# Patient Record
Sex: Female | Born: 1990 | Race: Black or African American | Hispanic: No | Marital: Single | Smoking: Never smoker
Health system: Southern US, Community
[De-identification: ages and names within clinical notes are randomized; demographics above are authoritative.]

## PROBLEM LIST (undated history)

## (undated) ENCOUNTER — Inpatient Hospital Stay (HOSPITAL_COMMUNITY): Payer: Self-pay

## (undated) DIAGNOSIS — Z8619 Personal history of other infectious and parasitic diseases: Secondary | ICD-10-CM

## (undated) DIAGNOSIS — Z789 Other specified health status: Secondary | ICD-10-CM

## (undated) DIAGNOSIS — R011 Cardiac murmur, unspecified: Secondary | ICD-10-CM

## (undated) HISTORY — DX: Cardiac murmur, unspecified: R01.1

## (undated) HISTORY — DX: Personal history of other infectious and parasitic diseases: Z86.19

## (undated) HISTORY — PX: THERAPEUTIC ABORTION: SHX798

## (undated) HISTORY — PX: NO PAST SURGERIES: SHX2092

---
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
## 2009-10-18 ENCOUNTER — Ambulatory Visit: Payer: Self-pay | Admitting: Nurse Practitioner

## 2009-10-18 ENCOUNTER — Inpatient Hospital Stay (HOSPITAL_COMMUNITY): Admission: AD | Admit: 2009-10-18 | Discharge: 2009-10-18 | Payer: Self-pay | Admitting: Obstetrics & Gynecology

## 2010-01-14 NOTE — L&D Delivery Note (Addendum)
Addendum -     Small perineal skidmark, did not require sutures   Delivery Note At 11:08 AM a viable female was delivered via  (LOA). Loose nuchal cord x 1 slipped, baby handed up to pt abd, cord doubly clamped and cut by pt sister,  APGAR: 9, 10 weight 7 lb 0.3 oz (3184 g).   Placenta status: schultze 3 vessel Cord, spontaneous intact:   EBL 250 ml Anesthesia: Epidural  Episiotomy: none Lacerations: none Suture Repair: none Est. Blood Loss (mL):   Mom to postpartum.  Baby to with mother.  KREBSBACH, MARY 01/12/2011, 11:48 AM

## 2010-01-27 ENCOUNTER — Emergency Department (HOSPITAL_COMMUNITY)
Admission: EM | Admit: 2010-01-27 | Discharge: 2010-01-27 | Payer: Self-pay | Source: Home / Self Care | Admitting: Emergency Medicine

## 2010-03-29 LAB — URINE MICROSCOPIC-ADD ON

## 2010-03-29 LAB — URINALYSIS, ROUTINE W REFLEX MICROSCOPIC
Bilirubin Urine: NEGATIVE
Glucose, UA: NEGATIVE mg/dL
Ketones, ur: 15 mg/dL — AB
Nitrite: NEGATIVE
Protein, ur: 100 mg/dL — AB
Specific Gravity, Urine: >1.03 — ABNORMAL HIGH (ref 1.005–1.030)
Urobilinogen, UA: 1 mg/dL (ref 0.0–1.0)
pH: 5.5 (ref 5.0–8.0)

## 2010-03-29 LAB — WET PREP, GENITAL
Clue Cells Wet Prep HPF POC: NONE SEEN
Trich, Wet Prep: NONE SEEN
Yeast Wet Prep HPF POC: NONE SEEN

## 2010-03-29 LAB — POCT PREGNANCY, URINE: Preg Test, Ur: NEGATIVE

## 2010-03-29 LAB — GC/CHLAMYDIA PROBE AMP, GENITAL
Chlamydia, DNA Probe: NEGATIVE
GC Probe Amp, Genital: NEGATIVE

## 2010-05-21 ENCOUNTER — Other Ambulatory Visit: Payer: Self-pay | Admitting: Family Medicine

## 2010-05-21 DIAGNOSIS — Z3682 Encounter for antenatal screening for nuchal translucency: Secondary | ICD-10-CM

## 2010-05-21 DIAGNOSIS — Z3689 Encounter for other specified antenatal screening: Secondary | ICD-10-CM

## 2010-05-21 LAB — ABO/RH

## 2010-05-21 LAB — GC/CHLAMYDIA PROBE AMP, GENITAL: Chlamydia: NEGATIVE

## 2010-06-15 ENCOUNTER — Ambulatory Visit (HOSPITAL_COMMUNITY): Admission: RE | Admit: 2010-06-15 | Payer: Medicaid Other | Source: Ambulatory Visit

## 2010-06-15 ENCOUNTER — Other Ambulatory Visit (HOSPITAL_COMMUNITY): Payer: Self-pay | Admitting: Obstetrics and Gynecology

## 2010-06-15 ENCOUNTER — Ambulatory Visit (HOSPITAL_COMMUNITY)
Admission: RE | Admit: 2010-06-15 | Discharge: 2010-06-15 | Disposition: A | Discharge status: Home / Self Care | Payer: Medicaid Other | Source: Ambulatory Visit | Attending: Family Medicine | Admitting: Family Medicine

## 2010-06-15 ENCOUNTER — Other Ambulatory Visit: Payer: Self-pay | Admitting: Family Medicine

## 2010-06-15 DIAGNOSIS — Z3682 Encounter for antenatal screening for nuchal translucency: Secondary | ICD-10-CM

## 2010-06-15 DIAGNOSIS — Z3689 Encounter for other specified antenatal screening: Secondary | ICD-10-CM | POA: Insufficient documentation

## 2010-06-15 DIAGNOSIS — O351XX0 Maternal care for (suspected) chromosomal abnormality in fetus, not applicable or unspecified: Secondary | ICD-10-CM | POA: Insufficient documentation

## 2010-06-15 DIAGNOSIS — O3510X0 Maternal care for (suspected) chromosomal abnormality in fetus, unspecified, not applicable or unspecified: Secondary | ICD-10-CM | POA: Insufficient documentation

## 2010-06-29 ENCOUNTER — Other Ambulatory Visit (HOSPITAL_COMMUNITY): Payer: Self-pay | Admitting: Obstetrics and Gynecology

## 2010-06-29 ENCOUNTER — Encounter (HOSPITAL_COMMUNITY): Payer: Self-pay

## 2010-06-29 ENCOUNTER — Ambulatory Visit (HOSPITAL_COMMUNITY)
Admission: RE | Admit: 2010-06-29 | Discharge: 2010-06-29 | Disposition: A | Discharge status: Home / Self Care | Payer: Medicaid Other | Source: Ambulatory Visit | Attending: Family Medicine | Admitting: Family Medicine

## 2010-06-29 ENCOUNTER — Other Ambulatory Visit: Payer: Self-pay | Admitting: Family Medicine

## 2010-06-29 ENCOUNTER — Ambulatory Visit (HOSPITAL_COMMUNITY): Admission: RE | Admit: 2010-06-29 | Payer: Medicaid Other | Source: Ambulatory Visit

## 2010-06-29 DIAGNOSIS — Z3682 Encounter for antenatal screening for nuchal translucency: Secondary | ICD-10-CM

## 2010-06-29 DIAGNOSIS — O3510X0 Maternal care for (suspected) chromosomal abnormality in fetus, unspecified, not applicable or unspecified: Secondary | ICD-10-CM | POA: Insufficient documentation

## 2010-06-29 DIAGNOSIS — O351XX0 Maternal care for (suspected) chromosomal abnormality in fetus, not applicable or unspecified: Secondary | ICD-10-CM | POA: Insufficient documentation

## 2010-06-29 DIAGNOSIS — Z3689 Encounter for other specified antenatal screening: Secondary | ICD-10-CM | POA: Insufficient documentation

## 2010-07-04 ENCOUNTER — Ambulatory Visit (HOSPITAL_COMMUNITY)
Admission: RE | Admit: 2010-07-04 | Discharge: 2010-07-04 | Disposition: A | Discharge status: Home / Self Care | Payer: Medicaid Other | Source: Ambulatory Visit | Attending: Family Medicine | Admitting: Family Medicine

## 2010-07-04 DIAGNOSIS — Z3682 Encounter for antenatal screening for nuchal translucency: Secondary | ICD-10-CM

## 2010-07-04 DIAGNOSIS — O351XX0 Maternal care for (suspected) chromosomal abnormality in fetus, not applicable or unspecified: Secondary | ICD-10-CM | POA: Insufficient documentation

## 2010-07-04 DIAGNOSIS — O3510X0 Maternal care for (suspected) chromosomal abnormality in fetus, unspecified, not applicable or unspecified: Secondary | ICD-10-CM | POA: Insufficient documentation

## 2010-07-04 DIAGNOSIS — Z3689 Encounter for other specified antenatal screening: Secondary | ICD-10-CM | POA: Insufficient documentation

## 2010-07-23 ENCOUNTER — Ambulatory Visit (HOSPITAL_COMMUNITY): Admission: RE | Admit: 2010-07-23 | Payer: Medicaid Other | Source: Ambulatory Visit

## 2010-07-30 ENCOUNTER — Ambulatory Visit (HOSPITAL_COMMUNITY): Payer: Medicaid Other

## 2010-08-06 ENCOUNTER — Ambulatory Visit (HOSPITAL_COMMUNITY): Admission: RE | Admit: 2010-08-06 | Payer: Medicaid Other | Source: Ambulatory Visit

## 2010-08-19 ENCOUNTER — Encounter (HOSPITAL_COMMUNITY): Payer: Self-pay | Admitting: *Deleted

## 2010-08-19 ENCOUNTER — Inpatient Hospital Stay (HOSPITAL_COMMUNITY)
Admission: AD | Admit: 2010-08-19 | Discharge: 2010-08-19 | Disposition: A | Discharge status: Home / Self Care | Payer: Medicaid Other | Source: Ambulatory Visit | Attending: Obstetrics and Gynecology | Admitting: Obstetrics and Gynecology

## 2010-08-19 DIAGNOSIS — O99891 Other specified diseases and conditions complicating pregnancy: Secondary | ICD-10-CM | POA: Insufficient documentation

## 2010-08-19 DIAGNOSIS — T7840XA Allergy, unspecified, initial encounter: Secondary | ICD-10-CM | POA: Insufficient documentation

## 2010-08-19 HISTORY — DX: Other specified health status: Z78.9

## 2010-08-19 LAB — CBC
HCT: 32.7 % — ABNORMAL LOW (ref 36.0–46.0)
Hemoglobin: 10.9 g/dL — ABNORMAL LOW (ref 12.0–15.0)
MCHC: 33.3 g/dL (ref 30.0–36.0)
RBC: 3.8 MIL/uL — ABNORMAL LOW (ref 3.87–5.11)
WBC: 6.4 10*3/uL (ref 4.0–10.5)

## 2010-08-19 LAB — DIFFERENTIAL
Lymphocytes Relative: 23 % (ref 12–46)
Lymphs Abs: 1.5 10*3/uL (ref 0.7–4.0)
Monocytes Absolute: 0.6 10*3/uL (ref 0.1–1.0)
Monocytes Relative: 9 % (ref 3–12)
Neutro Abs: 4.2 10*3/uL (ref 1.7–7.7)
Neutrophils Relative %: 65 % (ref 43–77)

## 2010-08-19 MED ORDER — IBUPROFEN 800 MG PO TABS
800.0000 mg | ORAL_TABLET | Freq: Once | ORAL | Status: AC
  Administered 2010-08-19: 800 mg via ORAL
  Filled 2010-08-19: qty 1

## 2010-08-19 MED ORDER — LORATADINE 10 MG PO TABS
10.0000 mg | ORAL_TABLET | Freq: Once | ORAL | Status: AC
  Administered 2010-08-19: 10 mg via ORAL
  Filled 2010-08-19: qty 1

## 2010-08-19 MED ORDER — LORATADINE 10 MG PO TABS: 10.0000 mg | ORAL_TABLET | Freq: Every day | ORAL | Status: DC

## 2010-08-19 NOTE — ED Provider Notes (Signed)
History  Helen Curtis is a 19y.o. BF G1P0 at 20.1 weeks per [facility] 01/05/11 (by 10.6 wk u/s), presents with CC of throat feeling tight, swollen, eyes swollen, congested, since awoke this AM around 10am. Pt has never had this happen before.  The only food she has ingested, which she hasn't eaten recently is a hot dog at work yesterday at [employer].  She does work outside, but in shade. She does report recent change in lotion, but no other change in detergent, soaps, etc.  Denies rash or hives since onset of symptoms, and denies noticing any bug bites, etc.   Hx r/f 1.  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  H/o chlamydia Pt started out care at [facility], and since transferred to [facility]   Chief Complaint  Patient presents with  . Facial Swelling   HPI  OB History    Grav Para Term Preterm Abortions TAB SAB Ect Mult Living   1         0      Past Medical History  Diagnosis Date  . No pertinent past medical history     Past Surgical History  Procedure Date  . No past surgeries     No family history on file.  History  Substance Use Topics  . Smoking status: Never Smoker   . Smokeless tobacco: Not on file  . Alcohol Use: No    Allergies: No Known Allergies  Prescriptions prior to admission  Medication Sig Dispense Refill  . prenatal vitamin w/FE, FA (PRENATAL 1 + 1) 27-1 MG TABS Take 1 tablet by mouth daily.          Review of Systems  Constitutional: Negative.   HENT: Positive for congestion.        Reports nose has become congested since awoke, felt more "open" just after got up.   Throat not sore; feels tight.  Eyes: Positive for blurred vision and discharge.       Eyes more swollen when 1st awoke this am; some matter, but not more than usual.  Watery.  Respiratory:       No difficulty breathing, except congested  Cardiovascular: Negative.   Gastrointestinal: Negative.   Genitourinary: Negative.   Musculoskeletal: Negative.   Skin: Negative.     Neurological: Negative.    Physical Exam   Blood pressure 118/58, pulse 81, temperature 99 F (37.2 C), temperature source Oral, resp. rate 18, height 5\' 7"  (1.702 m), weight 81.194 kg (179 lb), last menstrual period 03/21/2010, SpO2 100.00%.  Physical Exam  Constitutional: She is oriented to person, place, and time. She appears well-developed and well-nourished. She appears distressed.       Mildly anxious on arrival r/e symptoms  HENT:  Head: Normocephalic and atraumatic.  Neck: Normal range of motion. Neck supple.  Cardiovascular: Normal rate and regular rhythm.   Respiratory: Effort normal and breath sounds normal.  GI: Soft. Bowel sounds are normal.       gravid  Musculoskeletal: Normal range of motion. She exhibits edema.       Trace generalized edema in BLE  Lymphadenopathy:    She has no cervical adenopathy.  Neurological: She is alert and oriented to person, place, and time. She has normal reflexes. She displays normal reflexes. No cranial nerve deficit. She exhibits normal muscle tone. Coordination normal.  Skin: Skin is warm and dry. No rash noted. She is not diaphoretic. No erythema.  Psychiatric: She has a normal mood and  affect. Her behavior is normal. Thought content normal.   EFM:  Positive FHT's  LABS:  wnl except Hgb=10.9 (12.5 on 05/21/10)  MAU Course  Procedures 1.  PE 2.  FHT's 3.  Claritin 10mg  po x1 4.  Ibuprofen 800mg  po x1 5.  CBC with diff  Assessment and Plan  1.  IUP at 20.1 2.  Allergic reaction(unknown cause) 3.  Decreasing Hgb from early pregnancy 4.  Improving symptoms after Claritin administration  1.  D/C Home--pt to pick up Claritin or Zyrtec OTC and continue daily 2.  911 if symptoms severe, cannot breathe, etc. 3.  Discussed ice/ cool compresses to eyes; do not scratch 4.  Sudafed prn/ Motrin prn 5.  F/U prn worsening s/s, concerns, & 08/28/10 as scheduled at [facility] 6.  Discussed iron-rich diet  Helen Curtis H 08/19/2010, 3:21 PM

## 2010-08-19 NOTE — Progress Notes (Signed)
Notified of  Pt c/o of facial and throat swelling. Orders recieved

## 2010-08-19 NOTE — Progress Notes (Signed)
H.Steeleman,CNM at bedside to eval pt.

## 2010-08-19 NOTE — ED Notes (Signed)
Pt feeling a little better. Feels swelling has gone down a littel. Still feels eyes are swollen. Notified H.Steeleman,CNM.

## 2010-08-19 NOTE — Progress Notes (Signed)
Pt reports she woke up and felt her throat felt swollen/tight. Face and eys feel puffy and swollen as well

## 2010-09-18 ENCOUNTER — Encounter (HOSPITAL_COMMUNITY): Payer: Self-pay | Admitting: *Deleted

## 2010-09-18 ENCOUNTER — Inpatient Hospital Stay (HOSPITAL_COMMUNITY)
Admission: AD | Admit: 2010-09-18 | Discharge: 2010-09-18 | Disposition: A | Discharge status: Home Health | Payer: Medicaid Other | Source: Ambulatory Visit | Attending: Obstetrics and Gynecology | Admitting: Obstetrics and Gynecology

## 2010-09-18 ENCOUNTER — Other Ambulatory Visit: Payer: Self-pay

## 2010-09-18 DIAGNOSIS — O9989 Other specified diseases and conditions complicating pregnancy, childbirth and the puerperium: Secondary | ICD-10-CM | POA: Insufficient documentation

## 2010-09-18 DIAGNOSIS — R079 Chest pain, unspecified: Secondary | ICD-10-CM | POA: Insufficient documentation

## 2010-09-18 MED ORDER — PANTOPRAZOLE SODIUM 40 MG PO TBEC: 40.0000 mg | DELAYED_RELEASE_TABLET | Freq: Every day | ORAL | Status: DC

## 2010-09-18 MED ORDER — GI COCKTAIL ~~LOC~~
30.0000 mL | Freq: Once | ORAL | Status: AC
  Administered 2010-09-18: 30 mL via ORAL
  Filled 2010-09-18: qty 30

## 2010-09-18 NOTE — ED Notes (Signed)
Almond Lint CNM at bedside

## 2010-09-18 NOTE — Progress Notes (Signed)
Pt states, " I started having a pain in my upper chest at 1830. It depends on the position that I am in, sometimes it is sharp and sometimes it feels tight. I had been upset before the chest pain started."

## 2010-09-18 NOTE — Progress Notes (Signed)
PT SAYS SHE WAS LEAVING SS  - WAS UPSET- CRYING. HAD 2 GLASSES OF WATER AT 630PM - CHEST HURTING- BURPED - THEN CHEST  HURT IN MIDDLE BREAST-  WHEN SHE DRANK  GINGER ALE- NOW STILL HAS SAME PAIN.   PT TEXTING ON PHONE.  NO PAIN/ PRESSURE / TIGHTENING  FROM LOWER ABD.

## 2010-09-19 NOTE — ED Provider Notes (Signed)
Helen Curtis is a 19 y.o. female presenting for chest pain.  Maternal Medical History:  Reason for admission: Reason for Admission:   nauseaFetal activity: Perceived fetal activity is normal.   Last perceived fetal movement was within the past hour.     HPI Pt presents to MAU for c/o chest pain that began about 1830, she states it is constant and now somewhat improved. Denies any associated sx's, ctx, VB or LOF, reports +FM.  Pregnancy not significant for any major complications.  OB History    Grav Para Term Preterm Abortions TAB SAB Ect Mult Living   1         0     Past Medical History  Diagnosis Date  . No pertinent past medical history    Past Surgical History  Procedure Date  . No past surgeries    Family History: family history is not on file. Social History:  reports that she has never smoked. She has never used smokeless tobacco. She reports that she does not drink alcohol or use illicit drugs.  Review of Systems  Respiratory: Negative for shortness of breath and wheezing.   Cardiovascular: Positive for chest pain. Negative for palpitations and orthopnea.  Gastrointestinal: Positive for heartburn. Negative for nausea, vomiting and abdominal pain.  Neurological: Negative for dizziness and tingling.  All other sx's, normal.     Blood pressure 110/56, pulse 73, temperature 98.5 F (36.9 C), temperature source Oral, resp. rate 20, height 5\' 5"  (1.651 m), weight 86.297 kg (190 lb 4 oz), last menstrual period 03/21/2010, SpO2 99.00%. Maternal Exam:  Abdomen: Patient reports no abdominal tenderness. Introitus: not evaluated.     Fetal Exam Fetal Monitor Review: Mode: ultrasound.   Baseline rate: 150.  Variability: moderate (6-25 bpm).   Pattern: accelerations present and no decelerations.    Fetal State Assessment: Category I - tracings are normal.     Physical Exam  Constitutional: She is oriented to person, place, and time. She appears well-developed and  well-nourished. No distress.  Cardiovascular: Normal rate, regular rhythm and normal heart sounds.   No murmur heard. Respiratory: Effort normal and breath sounds normal. No respiratory distress. She exhibits no tenderness.  GI: Soft. Normal appearance.  Neurological: She is alert and oriented to person, place, and time.  Skin: Skin is warm and dry.    Prenatal labs: ABO, Rh:   Antibody:   Rubella:   RPR:    HBsAg:    HIV:    GBS:     Assessment/Plan: 24wks IUP EKG WNL FHR reassuring Likely acid reflux  D/C home protonix 40mg  PO QD TUMS PRN F/U routine in office  D/W Dr. Evlyn Kanner M 09/19/2010, 12:21 AM

## 2010-12-04 LAB — STREP B DNA PROBE: GBS: POSITIVE

## 2011-01-05 ENCOUNTER — Inpatient Hospital Stay (HOSPITAL_COMMUNITY)
Admission: AD | Admit: 2011-01-05 | Discharge: 2011-01-06 | Disposition: A | Discharge status: Home / Self Care | Payer: Medicaid Other | Source: Ambulatory Visit | Attending: Obstetrics and Gynecology | Admitting: Obstetrics and Gynecology

## 2011-01-05 ENCOUNTER — Encounter (HOSPITAL_COMMUNITY): Payer: Self-pay | Admitting: Obstetrics and Gynecology

## 2011-01-05 DIAGNOSIS — O479 False labor, unspecified: Secondary | ICD-10-CM | POA: Insufficient documentation

## 2011-01-05 MED ORDER — ZOLPIDEM TARTRATE 10 MG PO TABS
10.0000 mg | ORAL_TABLET | Freq: Every evening | ORAL | Status: DC | PRN
  Administered 2011-01-06: 10 mg via ORAL
  Filled 2011-01-05: qty 1

## 2011-01-05 MED ORDER — ZOLPIDEM TARTRATE 10 MG PO TABS: 10.0000 mg | ORAL_TABLET | Freq: Every evening | ORAL | Status: DC | PRN

## 2011-01-05 NOTE — ED Provider Notes (Signed)
History   Helen Curtis is a 20y.o. Black female who presents at 40 weeks for labor check.  Has called twice today w/ low back and pelvic pain, with little relief from Tylenol or warm baths.  No LOF or VB.  Good fetal movement.  No fever or recent illness.  Followed by CNM service.  Hx r/f:  1.  H/o LVEIF--spontaneously resolved    Chief Complaint  Patient presents with  . Contractions  . Back Pain   HPI  OB History    Grav Para Term Preterm Abortions TAB SAB Ect Mult Living   1         0      Past Medical History  Diagnosis Date  . No pertinent past medical history     Past Surgical History  Procedure Date  . No past surgeries     History reviewed. No pertinent family history.  History  Substance Use Topics  . Smoking status: Never Smoker   . Smokeless tobacco: Never Used  . Alcohol Use: No    Allergies: No Known Allergies  Prescriptions prior to admission  Medication Sig Dispense Refill  . acetaminophen (TYLENOL) 325 MG tablet Take 650 mg by mouth every 6 (six) hours as needed.        . loratadine (CLARITIN) 10 MG tablet Take 1 tablet (10 mg total) by mouth daily.      . pantoprazole (PROTONIX) 40 MG tablet Take 1 tablet (40 mg total) by mouth daily.  30 tablet  1  . Prenatal Vit-Fe Fumarate-FA (PRENATAL MULTIVITAMIN) TABS Take 1 tablet by mouth daily.          ROS--see HPI Physical Exam   Blood pressure 120/62, pulse 79, temperature 99 F (37.2 C), temperature source Oral, resp. rate 20, height 5\' 7"  (1.702 m), weight 104.894 kg (231 lb 4 oz), last menstrual period 03/21/2010.  Physical Exam  Constitutional: She is oriented to person, place, and time. She appears well-developed and well-nourished. No distress.  Cardiovascular: Normal rate and regular rhythm.   Respiratory: Effort normal and breath sounds normal.  GI: Soft. Bowel sounds are normal.       gravid  Genitourinary:       Cx:  Dimple/50/-1 to O posterior  Musculoskeletal: She exhibits edema.         2+ BLE pitting edema  Neurological: She is alert and oriented to person, place, and time.  Skin: Skin is warm and dry.    MAU Course  Procedures 1.  NST 2.  Physical exam  Assessment and Plan  1.  IUP at 40.1 weeks 2.  False vs early labor 3.  Reactive NST 4.  Pelvic pain/pressure 5.  MOPS  1.  D/c home w/ labor precautions & FKC 2.  Ambien 10mg  po x1 in MAU, & Rx given 3.  Pt doesn't have f/u appt at office; will have office call when reopens after holiday to schedule  Tirso Laws H 01/05/2011, 11:58 PM

## 2011-01-05 NOTE — Progress Notes (Signed)
Pt states, " I've had low back and side pain since Monday. Today I've had a lot of pressure in the very bottom of my abdomen and I think I'm having braxton hicks contractions. What is really bothering me is the pain in the bottom."

## 2011-01-05 NOTE — Progress Notes (Signed)
"  UC's started about 2030.  The pain is in my lower back, but pressure is in my lower abd.  No leaking or bleeding.  I believe I've lost my mucous plug.  I've been having little wet spots."

## 2011-01-06 NOTE — Progress Notes (Signed)
Written and verbal d/c instructions given and understanding voiced. Pt not to drive after taking Ambien. Fetal kick count reviewed.

## 2011-01-11 ENCOUNTER — Telehealth (HOSPITAL_COMMUNITY): Payer: Self-pay | Admitting: *Deleted

## 2011-01-11 ENCOUNTER — Inpatient Hospital Stay (HOSPITAL_COMMUNITY)
Admission: AD | Admit: 2011-01-11 | Discharge: 2011-01-14 | DRG: 775 | Disposition: A | Discharge status: Home / Self Care | Payer: Medicaid Other | Source: Ambulatory Visit | Attending: Obstetrics and Gynecology | Admitting: Obstetrics and Gynecology

## 2011-01-11 ENCOUNTER — Encounter (HOSPITAL_COMMUNITY): Payer: Self-pay | Admitting: *Deleted

## 2011-01-11 ENCOUNTER — Inpatient Hospital Stay (HOSPITAL_COMMUNITY)
Admission: AD | Admit: 2011-01-11 | Discharge: 2011-01-11 | Disposition: A | Discharge status: Home / Self Care | Payer: Medicaid Other | Source: Ambulatory Visit | Attending: Obstetrics and Gynecology | Admitting: Obstetrics and Gynecology

## 2011-01-11 DIAGNOSIS — O99892 Other specified diseases and conditions complicating childbirth: Principal | ICD-10-CM | POA: Diagnosis present

## 2011-01-11 DIAGNOSIS — Z22330 Carrier of Group B streptococcus: Secondary | ICD-10-CM

## 2011-01-11 DIAGNOSIS — Z3490 Encounter for supervision of normal pregnancy, unspecified, unspecified trimester: Secondary | ICD-10-CM

## 2011-01-11 DIAGNOSIS — E669 Obesity, unspecified: Secondary | ICD-10-CM

## 2011-01-11 DIAGNOSIS — O471 False labor at or after 37 completed weeks of gestation: Secondary | ICD-10-CM

## 2011-01-11 DIAGNOSIS — Z87440 Personal history of urinary (tract) infections: Secondary | ICD-10-CM

## 2011-01-11 DIAGNOSIS — O479 False labor, unspecified: Secondary | ICD-10-CM | POA: Insufficient documentation

## 2011-01-11 MED ORDER — PROMETHAZINE HCL 25 MG/ML IJ SOLN
12.5000 mg | Freq: Four times a day (QID) | INTRAMUSCULAR | Status: DC | PRN
Start: 2011-01-11 — End: 2011-01-11
  Administered 2011-01-11: 12.5 mg via INTRAVENOUS
  Filled 2011-01-11: qty 1

## 2011-01-11 MED ORDER — LACTATED RINGERS IV BOLUS (SEPSIS)
500.0000 mL | Freq: Once | INTRAVENOUS | Status: AC
  Administered 2011-01-11: 500 mL via INTRAVENOUS

## 2011-01-11 MED ORDER — NALBUPHINE SYRINGE 5 MG/0.5 ML
10.0000 mg | INJECTION | INTRAMUSCULAR | Status: DC | PRN
  Administered 2011-01-11: 10 mg via INTRAVENOUS
  Filled 2011-01-11 (×2): qty 1

## 2011-01-11 NOTE — Progress Notes (Addendum)
History   20 yo g1p0 EDC 12/22 presents with c/o of contractions x 2 days hurting in back and front with uc, denies srom or vag bleeding, with +FM. Denies ha, visual spots or blurring, with swelling legs only, with +FM.  Problem list: GBS+ Hole in heart as a baby resolved LVEIF with normal AFP, fetal eccho WNL  Chief Complaint  Patient presents with  . Contractions   @SFHPI @  OB History    Grav Para Term Preterm Abortions TAB SAB Ect Mult Living   1         0      Past Medical History  Diagnosis Date  . No pertinent past medical history     Past Surgical History  Procedure Date  . No past surgeries     No family history on file.  History  Substance Use Topics  . Smoking status: Never Smoker   . Smokeless tobacco: Never Used  . Alcohol Use: No    Allergies: No Known Allergies   Physical Exam  Abd soft gravid, nt uc mild to mod, vag 1 100 -1 VTX I by Almond Lint, CNM, no vag bleeding, with +1 edema lower legs Blood pressure 128/73, pulse 86, temperature 97.5 F (36.4 C), temperature source Oral, resp. rate 16, height 5\' 7"  (1.702 m), weight 104.237 kg (229 lb 12.8 oz), last menstrual period 03/21/2010.    ED Course  40 6/7 week IUP GBS+ Contractions Plan: IV hydration, nubain, phenergan reassess cervix approximately 2 hours. Lavera Guise, CNM  Addendum: [phone] Vag 1 90 -1 VTX I No cervical change States more comfortable has slept Discharge home/ f/o induction 12/29 as scheduled. S/s labor to report reviewed. Lavera Guise, CNM

## 2011-01-11 NOTE — Progress Notes (Signed)
Pt G1 at 40.6wks, contracting every .  Denies leaking or bleeding.  Passed mucous plug today.

## 2011-01-11 NOTE — Telephone Encounter (Signed)
Preadmission screen  

## 2011-01-11 NOTE — Progress Notes (Signed)
Pt states, " I've had contractions every 4 min since last night. I was 1 cm this morning and now they are stronger."

## 2011-01-12 ENCOUNTER — Encounter (HOSPITAL_COMMUNITY): Payer: Self-pay | Admitting: Anesthesiology

## 2011-01-12 ENCOUNTER — Inpatient Hospital Stay (HOSPITAL_COMMUNITY): Payer: Medicaid Other | Admitting: Anesthesiology

## 2011-01-12 ENCOUNTER — Inpatient Hospital Stay (HOSPITAL_COMMUNITY): Admission: RE | Admit: 2011-01-12 | Payer: Self-pay | Source: Ambulatory Visit

## 2011-01-12 ENCOUNTER — Encounter (HOSPITAL_COMMUNITY): Payer: Self-pay

## 2011-01-12 ENCOUNTER — Encounter (HOSPITAL_COMMUNITY): Payer: Self-pay | Admitting: *Deleted

## 2011-01-12 DIAGNOSIS — Z22330 Carrier of Group B streptococcus: Secondary | ICD-10-CM

## 2011-01-12 DIAGNOSIS — E669 Obesity, unspecified: Secondary | ICD-10-CM

## 2011-01-12 LAB — COMPREHENSIVE METABOLIC PANEL
Albumin: 2.7 g/dL — ABNORMAL LOW (ref 3.5–5.2)
BUN: 3 mg/dL — ABNORMAL LOW (ref 6–23)
Creatinine, Ser: 0.53 mg/dL (ref 0.50–1.10)
GFR calc Af Amer: >90 mL/min (ref >90)
Glucose, Bld: 87 mg/dL (ref 70–99)
Total Protein: 6.2 g/dL (ref 6.0–8.3)

## 2011-01-12 LAB — URIC ACID: Uric Acid, Serum: 4 mg/dL (ref 2.4–7.0)

## 2011-01-12 LAB — URINE MICROSCOPIC-ADD ON

## 2011-01-12 LAB — URINALYSIS, ROUTINE W REFLEX MICROSCOPIC
Bilirubin Urine: NEGATIVE
Ketones, ur: 40 mg/dL — AB
Nitrite: NEGATIVE
Protein, ur: 30 mg/dL — AB
Urobilinogen, UA: 0.2 mg/dL (ref 0.0–1.0)

## 2011-01-12 LAB — CBC
HCT: 35.8 % — ABNORMAL LOW (ref 36.0–46.0)
Hemoglobin: 12 g/dL (ref 12.0–15.0)
MCV: 81.7 fL (ref 78.0–100.0)
RBC: 4.38 MIL/uL (ref 3.87–5.11)
RDW: 15.8 % — ABNORMAL HIGH (ref 11.5–15.5)
WBC: 9 10*3/uL (ref 4.0–10.5)

## 2011-01-12 LAB — LACTATE DEHYDROGENASE: LDH: 189 U/L (ref 94–250)

## 2011-01-12 MED ORDER — ONDANSETRON HCL 4 MG PO TABS: 4.0000 mg | ORAL_TABLET | ORAL | Status: DC | PRN

## 2011-01-12 MED ORDER — IBUPROFEN 600 MG PO TABS: 600.0000 mg | ORAL_TABLET | Freq: Four times a day (QID) | ORAL | Status: DC | PRN

## 2011-01-12 MED ORDER — OXYCODONE-ACETAMINOPHEN 5-325 MG PO TABS: 2.0000 | ORAL_TABLET | ORAL | Status: DC | PRN

## 2011-01-12 MED ORDER — ZOLPIDEM TARTRATE 5 MG PO TABS: 5.0000 mg | ORAL_TABLET | Freq: Every evening | ORAL | Status: DC | PRN

## 2011-01-12 MED ORDER — TERBUTALINE SULFATE 1 MG/ML IJ SOLN: 0.2500 mg | Freq: Once | INTRAMUSCULAR | Status: DC | PRN

## 2011-01-12 MED ORDER — PHENYLEPHRINE 40 MCG/ML (10ML) SYRINGE FOR IV PUSH (FOR BLOOD PRESSURE SUPPORT): 80.0000 ug | PREFILLED_SYRINGE | INTRAVENOUS | Status: DC | PRN

## 2011-01-12 MED ORDER — BENZOCAINE-MENTHOL 20-0.5 % EX AERO: 1.0000 "application " | INHALATION_SPRAY | CUTANEOUS | Status: DC | PRN

## 2011-01-12 MED ORDER — WITCH HAZEL-GLYCERIN EX PADS: 1.0000 "application " | MEDICATED_PAD | CUTANEOUS | Status: DC | PRN

## 2011-01-12 MED ORDER — OXYTOCIN 10 UNIT/ML IJ SOLN: 10.0000 [IU] | Freq: Once | INTRAMUSCULAR | Status: DC

## 2011-01-12 MED ORDER — PROMETHAZINE HCL 25 MG/ML IJ SOLN
12.5000 mg | Freq: Once | INTRAMUSCULAR | Status: AC
  Administered 2011-01-12: 12.5 mg via INTRAVENOUS
  Filled 2011-01-12: qty 1

## 2011-01-12 MED ORDER — DIBUCAINE 1 % RE OINT: 1.0000 "application " | TOPICAL_OINTMENT | RECTAL | Status: DC | PRN

## 2011-01-12 MED ORDER — BUTORPHANOL TARTRATE 2 MG/ML IJ SOLN: 2.0000 mg | INTRAMUSCULAR | Status: DC | PRN

## 2011-01-12 MED ORDER — SIMETHICONE 80 MG PO CHEW: 80.0000 mg | CHEWABLE_TABLET | ORAL | Status: DC | PRN

## 2011-01-12 MED ORDER — PRENATAL MULTIVITAMIN CH
1.0000 | ORAL_TABLET | Freq: Every day | ORAL | Status: DC
  Administered 2011-01-12 – 2011-01-14 (×3): 1 via ORAL
  Filled 2011-01-12 (×3): qty 1

## 2011-01-12 MED ORDER — ACETAMINOPHEN 325 MG PO TABS: 650.0000 mg | ORAL_TABLET | ORAL | Status: DC | PRN

## 2011-01-12 MED ORDER — EPHEDRINE 5 MG/ML INJ
10.0000 mg | INTRAVENOUS | Status: DC | PRN
  Filled 2011-01-12: qty 4

## 2011-01-12 MED ORDER — LIDOCAINE HCL 1.5 % IJ SOLN
INTRAMUSCULAR | Status: DC | PRN
  Administered 2011-01-12 (×2): 4 mL via EPIDURAL

## 2011-01-12 MED ORDER — LACTATED RINGERS IV SOLN
500.0000 mL | Freq: Once | INTRAVENOUS | Status: AC
  Administered 2011-01-12: 500 mL via INTRAVENOUS

## 2011-01-12 MED ORDER — PENICILLIN G POTASSIUM 5000000 UNITS IJ SOLR
2.5000 10*6.[IU] | INTRAVENOUS | Status: DC
  Administered 2011-01-12 (×2): 2.5 10*6.[IU] via INTRAVENOUS
  Filled 2011-01-12 (×7): qty 2.5

## 2011-01-12 MED ORDER — LANOLIN HYDROUS EX OINT: TOPICAL_OINTMENT | CUTANEOUS | Status: DC | PRN

## 2011-01-12 MED ORDER — FENTANYL 2.5 MCG/ML BUPIVACAINE 1/10 % EPIDURAL INFUSION (WH - ANES)
14.0000 mL/h | INTRAMUSCULAR | Status: DC
  Administered 2011-01-12 (×3): 14 mL/h via EPIDURAL
  Filled 2011-01-12 (×4): qty 60

## 2011-01-12 MED ORDER — PENICILLIN G POTASSIUM 5000000 UNITS IJ SOLR
5.0000 10*6.[IU] | Freq: Once | INTRAMUSCULAR | Status: AC
  Administered 2011-01-12: 5 10*6.[IU] via INTRAVENOUS
  Filled 2011-01-12: qty 5

## 2011-01-12 MED ORDER — DIPHENHYDRAMINE HCL 50 MG/ML IJ SOLN: 12.5000 mg | INTRAMUSCULAR | Status: DC | PRN

## 2011-01-12 MED ORDER — OXYTOCIN BOLUS FROM INFUSION
500.0000 mL | Freq: Once | INTRAVENOUS | Status: DC
  Filled 2011-01-12: qty 1000
  Filled 2011-01-12: qty 500

## 2011-01-12 MED ORDER — LACTATED RINGERS IV SOLN
INTRAVENOUS | Status: DC
  Administered 2011-01-12: 05:00:00 via INTRAVENOUS

## 2011-01-12 MED ORDER — SODIUM CHLORIDE 0.9 % IV SOLN: 250.0000 mL | INTRAVENOUS | Status: DC | PRN

## 2011-01-12 MED ORDER — EPHEDRINE 5 MG/ML INJ: 10.0000 mg | INTRAVENOUS | Status: DC | PRN

## 2011-01-12 MED ORDER — DIPHENHYDRAMINE HCL 25 MG PO CAPS: 25.0000 mg | ORAL_CAPSULE | Freq: Four times a day (QID) | ORAL | Status: DC | PRN

## 2011-01-12 MED ORDER — IBUPROFEN 600 MG PO TABS
600.0000 mg | ORAL_TABLET | Freq: Four times a day (QID) | ORAL | Status: DC
  Administered 2011-01-12 – 2011-01-14 (×8): 600 mg via ORAL
  Filled 2011-01-12 (×8): qty 1

## 2011-01-12 MED ORDER — FENTANYL 2.5 MCG/ML BUPIVACAINE 1/10 % EPIDURAL INFUSION (WH - ANES)
INTRAMUSCULAR | Status: DC | PRN
  Administered 2011-01-12: 14 mL/h via EPIDURAL

## 2011-01-12 MED ORDER — OXYCODONE-ACETAMINOPHEN 5-325 MG PO TABS: 1.0000 | ORAL_TABLET | ORAL | Status: DC | PRN

## 2011-01-12 MED ORDER — LIDOCAINE HCL (PF) 1 % IJ SOLN
30.0000 mL | INTRAMUSCULAR | Status: DC | PRN
  Filled 2011-01-12: qty 30

## 2011-01-12 MED ORDER — SODIUM CHLORIDE 0.9 % IJ SOLN: 3.0000 mL | Freq: Two times a day (BID) | INTRAMUSCULAR | Status: DC

## 2011-01-12 MED ORDER — LIDOCAINE-EPINEPHRINE (PF) 2 %-1:200000 IJ SOLN
INTRAMUSCULAR | Status: AC
  Filled 2011-01-12: qty 20

## 2011-01-12 MED ORDER — SENNOSIDES-DOCUSATE SODIUM 8.6-50 MG PO TABS
2.0000 | ORAL_TABLET | Freq: Every day | ORAL | Status: DC
  Administered 2011-01-12 – 2011-01-13 (×2): 2 via ORAL

## 2011-01-12 MED ORDER — TETANUS-DIPHTH-ACELL PERTUSSIS 5-2.5-18.5 LF-MCG/0.5 IM SUSP: 0.5000 mL | Freq: Once | INTRAMUSCULAR | Status: DC

## 2011-01-12 MED ORDER — ONDANSETRON HCL 4 MG/2ML IJ SOLN: 4.0000 mg | INTRAMUSCULAR | Status: DC | PRN

## 2011-01-12 MED ORDER — SODIUM CHLORIDE 0.9 % IJ SOLN: 3.0000 mL | INTRAMUSCULAR | Status: DC | PRN

## 2011-01-12 MED ORDER — MEDROXYPROGESTERONE ACETATE 150 MG/ML IM SUSP: 150.0000 mg | INTRAMUSCULAR | Status: DC | PRN

## 2011-01-12 MED ORDER — SODIUM BICARBONATE 8.4 % IV SOLN
INTRAVENOUS | Status: DC | PRN
  Administered 2011-01-12: 5 mL via EPIDURAL

## 2011-01-12 MED ORDER — CITRIC ACID-SODIUM CITRATE 334-500 MG/5ML PO SOLN: 30.0000 mL | ORAL | Status: DC | PRN

## 2011-01-12 MED ORDER — LACTATED RINGERS IV SOLN: 500.0000 mL | INTRAVENOUS | Status: DC | PRN

## 2011-01-12 MED ORDER — ONDANSETRON HCL 4 MG/2ML IJ SOLN
4.0000 mg | Freq: Four times a day (QID) | INTRAMUSCULAR | Status: DC | PRN
  Administered 2011-01-12: 4 mg via INTRAVENOUS
  Filled 2011-01-12: qty 2

## 2011-01-12 MED ORDER — PHENYLEPHRINE 40 MCG/ML (10ML) SYRINGE FOR IV PUSH (FOR BLOOD PRESSURE SUPPORT)
80.0000 ug | PREFILLED_SYRINGE | INTRAVENOUS | Status: DC | PRN
  Filled 2011-01-12: qty 5

## 2011-01-12 MED ORDER — SODIUM BICARBONATE 8.4 % IV SOLN
INTRAVENOUS | Status: AC
  Filled 2011-01-12: qty 50

## 2011-01-12 MED ORDER — OXYTOCIN 20 UNITS IN LACTATED RINGERS INFUSION - SIMPLE: 1.0000 m[IU]/min | INTRAVENOUS | Status: DC

## 2011-01-12 MED ORDER — OXYTOCIN 20 UNITS IN LACTATED RINGERS INFUSION - SIMPLE
125.0000 mL/h | Freq: Once | INTRAVENOUS | Status: AC
  Administered 2011-01-12: 1000 mL/h via INTRAVENOUS

## 2011-01-12 NOTE — Anesthesia Postprocedure Evaluation (Signed)
  Anesthesia Post-op Note  Patient: Helen Curtis  Procedure(s) Performed: * No procedures listed *  Patient Location: PACU and Mother/Baby  Anesthesia Type: Epidural  Level of Consciousness: awake, alert  and oriented  Airway and Oxygen Therapy: Patient Spontanous Breathing   Post-op Assessment: Patient's Cardiovascular Status Stable and Respiratory Function Stable  Post-op Vital Signs: stable  Complications: No apparent anesthesia complications

## 2011-01-12 NOTE — Progress Notes (Signed)
Patient ID: Helen Curtis, female   DOB: [birth year], 20 y.o.   MRN: [id number] .Subjective:  C/o pain scale 10, despite multiple positions changes and epidural PCA, some ctx it's only on 1 side, others it's both. Denies pressure or vaginal pain, states pain is suprapubic.   Objective: BP 146/67  Pulse 94  Temp(Src) 98.3 F (36.8 C) (Oral)  Resp 20  Ht 5\' 7"  (1.702 m)  Wt 103.874 kg (229 lb)  BMI 35.87 kg/m2  LMP 03/21/2010   FHT:  FHR: 125 bpm, variability: moderate,  accelerations:  Present,  decelerations:  Absent UC:   regular, every 2-4 minutes, coupling - toco wasn't tracing well, IUPC placed  SVE:   Dilation: 8 Effacement (%): 100 Station: -1 Exam by:: S. Kimesha Claxton, CNM    Assessment / Plan: Spontaneous labor, progressing normally GBS pos IUPC placed - vertex now OT but still asynclitic    Fetal Wellbeing:  Category I Pain Control:  Epidural Will call anesthesia to evaluate epidural   Update physician PRN  Malissa Hippo 01/12/2011, 5:56 AM

## 2011-01-12 NOTE — Anesthesia Preprocedure Evaluation (Signed)
Anesthesia Evaluation  Patient identified by MRN, date of birth, ID band Patient awake    Reviewed: Allergy & Precautions, H&P , Patient's Chart, lab work & pertinent test results  Airway Mallampati: III TM Distance: >3 FB Neck ROM: full    Dental No notable dental hx. (+) Teeth Intact   Pulmonary neg pulmonary ROS,  clear to auscultation  Pulmonary exam normal       Cardiovascular neg cardio ROS + Valvular Problems/Murmurs regular Normal Hx/o "Hole in heart" which she outgrew   Neuro/Psych Negative Neurological ROS  Negative Psych ROS   GI/Hepatic negative GI ROS, Neg liver ROS,   Endo/Other  Morbid obesity  Renal/GU negative Renal ROS  Genitourinary negative   Musculoskeletal   Abdominal Normal abdominal exam  (+)   Peds  Hematology negative hematology ROS (+)   Anesthesia Other Findings   Reproductive/Obstetrics (+) Pregnancy                           Anesthesia Physical Anesthesia Plan  ASA: III  Anesthesia Plan: Epidural   Post-op Pain Management:    Induction:   Airway Management Planned:   Additional Equipment:   Intra-op Plan:   Post-operative Plan:   Informed Consent: I have reviewed the patients History and Physical, chart, labs and discussed the procedure including the risks, benefits and alternatives for the proposed anesthesia with the patient or authorized representative who has indicated his/her understanding and acceptance.     Plan Discussed with: Anesthesiologist and Surgeon  Anesthesia Plan Comments:         Anesthesia Quick Evaluation

## 2011-01-12 NOTE — Progress Notes (Signed)
Baby to R breast using football hold.  Opens wide, but cries at breast, does not want to suck.  Placed back on chest for cont'd skin-to-skin & will try again shortly.

## 2011-01-12 NOTE — H&P (Signed)
Helen Curtis is a 20 y.o. female presenting for onset of ctx, was sent home early this AM after IVF"s and pain meds. States she slept until about 4pm and then ctx returned, stronger now. Denies LOF or VB, +FM.  Pregnancy significant for:  1. +GBS 2. Pt has hx of hole in heart, resolved 3. FETAL IEF, fetal echo normal 4. Obesity  HPI: [facility] 8[facility], a 10wk [place] changed EDC to 12/22 from LMP date of 12/12. SHe then tx care to [facility] at 16wks, and has followed a normal prenatal course. LVEIF was noted on [place], but then resolved. A fetal echo was done and was normal.   Maternal Medical History:  Reason for admission: Reason for admission: contractions.  Contractions: Onset was 2 days ago.   Frequency: regular.   Perceived severity is moderate.    Fetal activity: Perceived fetal activity is normal.   Last perceived fetal movement was within the past hour.    Prenatal complications: no prenatal complications   OB History    Grav Para Term Preterm Abortions TAB SAB Ect Mult Living   1 0 0 0 0 0 0 0 0 0      Past Medical History  Diagnosis Date  . No pertinent past medical history   . History of chicken pox   . History of chlamydia   . Heart murmur     born with small hole in heart "I outgrew it"   Past Surgical History  Procedure Date  . No past surgeries    Family History: family history includes Alzheimer's disease in her paternal grandmother; Cancer in her paternal grandfather and paternal grandmother; Hypertension in her maternal grandmother and mother; and Seizures in her mother.  There is no history of Anesthesia problems. Social History:  reports that she has never smoked. She has never used smokeless tobacco. She reports that she does not drink alcohol or use illicit drugs.  Pt is a single AAF, 20yo, 24yrs education and works FT in Engineering geologist. Her mother is here and supportive and FOB is involved and supportive.   Review of Systems  All other  systems reviewed and are negative.      Blood pressure 125/78, pulse 94, temperature 99.1 F (37.3 C), temperature source Oral, resp. rate 20, height 5\' 7"  (1.702 m), weight 103.874 kg (229 lb), last menstrual period 03/21/2010. Maternal Exam:  Uterine Assessment: Contraction strength is moderate.  Contraction duration is 60 seconds. Contraction frequency is regular.   Abdomen: Patient reports no abdominal tenderness. Fundal height is AGA.   Estimated fetal weight is 8#.   Fetal presentation: vertex  Introitus: Normal vulva. Normal vagina.  Amniotic fluid character: not assessed.  Pelvis: adequate for delivery.   Cervix: Cervix evaluated by digital exam.     Fetal Exam Fetal Monitor Review: Mode: ultrasound.   Baseline rate: 145.  Variability: moderate (6-25 bpm).   Pattern: accelerations present and no decelerations.    Fetal State Assessment: Category I - tracings are normal.     Physical Exam  Nursing note and vitals reviewed. Constitutional: She is oriented to person, place, and time. She appears well-developed and well-nourished.  Neck: Normal range of motion.  Cardiovascular: Normal rate and normal heart sounds.   Respiratory: Effort normal.  GI: Soft.  Genitourinary: Vagina normal.  Musculoskeletal: Normal range of motion. She exhibits edema.       3+ BLE - no change from previous exam  Neurological: She is alert  and oriented to person, place, and time.  Skin: Skin is warm and dry.  Psychiatric: She has a normal mood and affect. Her behavior is normal. Judgment and thought content normal.   VE=4cm/100/-1 vtx    Prenatal labs:   ABO, Rh: B/Negative/-- (05/07 0000) Antibody: Negative (05/07 0000) Rubella: Immune (05/07 0000) RPR:   NR HBsAg: Negative (05/07 0000)  HIV: Non-reactive (05/07 0000)  GBS: Positive (11/20 0000)  Hgb electro - neg Varicella immune GC/CT - neg 1hr gtt = 90   Assessment/Plan: IUP at 41wks Early labor GBS pos FHR  reassuring  Admit to birthing suites, dr Su Hilt attending, cnm care Desires epidural Arom/pitiocin PRN   Helen Curtis M 01/12/2011, 12:03 AM

## 2011-01-12 NOTE — Anesthesia Procedure Notes (Addendum)
Epidural Patient location during procedure: OB Start time: 01/12/2011 1:15 AM  Staffing Anesthesiologist: FOSTER, MICHAEL A. Performed by: anesthesiologist   Preanesthetic Checklist Completed: patient identified, site marked, surgical consent, pre-op evaluation, timeout performed, IV checked, risks and benefits discussed and monitors and equipment checked  Epidural Patient position: sitting Prep: site prepped and draped and DuraPrep Patient monitoring: continuous pulse ox and blood pressure Approach: midline Injection technique: LOR air  Needle:  Needle type: Tuohy  Needle gauge: 17 G Needle length: 9 cm Needle insertion depth: 5 cm cm Catheter type: closed end flexible Catheter size: 19 Gauge Catheter at skin depth: 10 cm Test dose: negative and 1.5% lidocaine  Assessment Events: blood not aspirated, injection not painful, no injection resistance, negative IV test and no paresthesia  Additional Notes Patient is more comfortable after epidural dosed. Please see RN's note for documentation of vital signs and FHR which are stable.

## 2011-01-12 NOTE — Progress Notes (Addendum)
C/o of pressure with contractions, asleep between O VSS     Fhts 140 LTV mod accels     uc q 1-3 tripling at times     Vag rim 0/+1 by RN A active labor P labor down. Lavera Guise, CNM  Addendum: Serial bps elevated will do PIH labs now 0920 Lavera Guise, CNM

## 2011-01-12 NOTE — Progress Notes (Signed)
Patient ID: Helen Curtis, female   DOB: 1990-06-20, 21 y.o.   MRN: [id number] .Subjective:  Comfortable now after epidural, mom and FOB at bs  Objective: BP 146/71  Pulse 85  Temp(Src) 98.2 F (36.8 C) (Oral)  Resp 18  Ht 5\' 7"  (1.702 m)  Wt 103.874 kg (229 lb)  BMI 35.87 kg/m2  LMP 03/21/2010   FHT:  FHR: 130 bpm, variability: moderate,  accelerations:  Present,  decelerations:  Present occ mild variable UC:   irregular, every 2-5 minutes SVE:   Dilation: 4 Effacement (%): 100 Station: -1 Exam by:: Dupont, RN     Assessment / Plan: Spontaneous labor, progressing normally GBS pos, PCN started Likely OP position, will utilize positions changes   Fetal Wellbeing:  Category I Pain Control:  Epidural  Will recheck cx in 2 hours and plan AROM/Pitocin/IUPC PRN  Update physician PRN  Nana Hoselton M 01/12/2011, 2:09 AM

## 2011-01-12 NOTE — Progress Notes (Signed)
Patient ID: Helen Curtis, female   DOB: 04/05/1990, 20 y.o.   MRN: [id number] .Subjective: C/o shaking and L lower side "pressure" cramping, difficult to describe, has been moved s-s in exasserated   sims, but states this makes shaking worse. Pt appears anxious and tense. States she feels like throwing up but can't.  Denies pain with ctx   Objective: BP 150/83  Pulse 86  Temp(Src) 98.2 F (36.8 C) (Oral)  Resp 18  Ht 5\' 7"  (1.702 m)  Wt 103.874 kg (229 lb)  BMI 35.87 kg/m2  LMP 03/21/2010   FHT:  FHR: 125 bpm, variability: moderate,  accelerations:  Present,  decelerations:  Present occ mild variables UC:   regular, every 4-5 minutes SVE:   Dilation: 6 Effacement (%): 100 Station: -1 Exam by:: Kerrigan Gombos, CNM    Assessment / Plan: Spontaneous labor, progressing normally GBS pos, PCN given Appears to have SROM with clear/blood tinged fluid   Fetal Wellbeing:  Category I Pain Control:  Epidural Will given phenergan IV for relaxation/nausea  Update physician PRN  Kinser Fellman M 01/12/2011, 3:25 AM

## 2011-01-13 LAB — CBC
HCT: 31.8 % — ABNORMAL LOW (ref 36.0–46.0)
MCV: 82.4 fL (ref 78.0–100.0)
Platelets: 156 10*3/uL (ref 150–400)
RBC: 3.86 MIL/uL — ABNORMAL LOW (ref 3.87–5.11)
RDW: 15.8 % — ABNORMAL HIGH (ref 11.5–15.5)
WBC: 11.1 10*3/uL — ABNORMAL HIGH (ref 4.0–10.5)

## 2011-01-13 NOTE — Progress Notes (Addendum)
Post Partum Day 1 Subjective: no complaints, up ad lib, voiding, tolerating PO, + flatus and newborn at breast while at bedside for round.  s.o. at bedside.  No dizziness.  VB tapering.  Objective: Blood pressure 114/72, pulse 80, temperature 98 F (36.7 C), temperature source Oral, resp. rate 18, height 5\' 7"  (1.702 m), weight 103.874 kg (229 lb), last menstrual period 03/21/2010, unknown if currently breastfeeding.  Physical Exam:  General: alert, cooperative, fatigued, no distress and moderately obese Lochia: appropriate Uterine Fundus: firm, below umbilicus Incision: n/a DVT Evaluation: No evidence of DVT seen on physical exam. Negative Homan's sign. 2+ BLE pitting edema   Basename 01/13/11 0547 01/12/11 0030  HGB 10.3* 12.0  HCT 31.8* 35.8*    Assessment/Plan: Plan for discharge tomorrow, Breastfeeding and Lactation consult; Continue current POC.  LOS: 2 days   STEELMAN,CANDICE H 01/13/2011, 11:01 AM    Agree with above - AYR

## 2011-01-13 NOTE — Progress Notes (Signed)
Sitting in bed eatting supper, rm. Very hot. Stated had a sudden dizzy spell. Encouraged fluids, turned heat in rm. Down. Fundus firm, bleeding WDL. Stated feeling better, just a quick sudden feeling, pt. Stated maybe she was to hot. Cool cloth given to wipe face. Pt. Mom at bedside. Stated no heavy bleeding or clots during day. Will occasionally have a slight dizzy feeling when she first gets up. Encouraged to get up slow. Reporting to oncoming nurse.

## 2011-01-14 MED ORDER — NORETHINDRONE 0.35 MG PO TABS: 1.0000 | ORAL_TABLET | Freq: Every day | ORAL | Status: DC

## 2011-01-14 MED ORDER — IBUPROFEN 600 MG PO TABS: 600.0000 mg | ORAL_TABLET | Freq: Four times a day (QID) | ORAL | Status: AC

## 2011-01-14 NOTE — Progress Notes (Signed)
UR chart review completed.  

## 2011-01-14 NOTE — Discharge Summary (Signed)
   Obstetric Discharge Summary Reason for Admission: onset of labor Prenatal Procedures: NST and ultrasound Intrapartum Procedures: spontaneous vaginal delivery and GBS prophylaxis, epidural Postpartum Procedures: none Complications-Operative and Postpartum: none  Temp:  [97.8 F (36.6 C)] 97.8 F (36.6 C) (12/31 0530) Pulse Rate:  [87] 87  (12/31 0530) Resp:  [18] 18  (12/31 0530) BP: (123)/(80) 123/80 mmHg (12/31 0530) Hemoglobin  Date Value Range Status  01/13/2011 10.3* 12.0-15.0 (g/dL) Final     HCT  Date Value Range Status  01/13/2011 31.8* 36.0-46.0 (%) Final    Hospital Course:  Hospital Course: Admitted in labor at 4cm. pos GBS. Received epidural and continued to progress. IUPC was placed secondary to difficulty tracing ctx. Progressed to fully dilated, . Delivery was performed by Maryln Manuel, CNM without difficulty. Patient and baby tolerated the procedure without difficulty, with no laceration noted. Infant to FTN. Mother and infant then had an uncomplicated postpartum course, with breast feeding going well. Mom's physical exam was WNL, and she was discharged home in stable condition. Contraception plan was Pharmacist, hospital.  She received adequate benefit from po pain medications.  Discharge Diagnoses: Term Pregnancy-delivered  Discharge Information: Date: 01/14/2011 Activity: pelvic rest Diet: routine Medications:  Medication List  As of 01/14/2011 10:28 PM   START taking these medications         ibuprofen 600 MG tablet   Commonly known as: ADVIL,MOTRIN   Take 1 tablet (600 mg total) by mouth every 6 (six) hours.      norethindrone 0.35 MG tablet   Commonly known as: MICRONOR,CAMILA,ERRIN   Take 1 tablet (0.35 mg total) by mouth daily.         CONTINUE taking these medications         prenatal multivitamin Tabs         STOP taking these medications         acetaminophen 325 MG tablet      diphenhydrAMINE 12.5 MG/5ML elixir          Where to get your  medications    These are the prescriptions that you need to pick up.   You may get these medications from any pharmacy.         ibuprofen 600 MG tablet   norethindrone 0.35 MG tablet           Condition: stable Instructions: refer to practice specific booklet Discharge to: home Follow-up Information    Follow up with Maebell Lyvers M, CNM in 6 weeks. (If symptoms worsen)    Contact information:   [address] [place] [address] [phone]          Newborn Data: Live born  Information for the patient's newborn:  Tikia, Skilton [[id number]]  female ; APGAR 9, 10 ; weight ; 7# Home with mother.  Riot Barrick M 01/14/2011, 10:28 PM

## 2011-01-14 NOTE — Progress Notes (Signed)
Patient ID: Helen Curtis, female   DOB: 1990/09/21, 20 y.o.   MRN: [id number] Post Partum Day 2 Subjective: no complaints, up ad lib without syncope, voiding, tolerating PO, + flatus  Pain well controlled with po meds BF well Mood stable, bonding well   Objective: Blood pressure 123/80, pulse 87, temperature 97.8 F (36.6 C), temperature source Oral, resp. rate 18, height 5\' 7"  (1.702 m), weight 103.874 kg (229 lb), last menstrual period 03/21/2010, unknown if currently breastfeeding.  Physical Exam:  General: alert and no distress Lungs: CTAB Heart: RRR Breasts: WNL Lochia: appropriate Uterine Fundus: firm Perineum: WNL DVT Evaluation: No evidence of DVT seen on physical exam. Negative Homan's sign. Calf/Ankle edema is present.   Basename 01/13/11 0547 01/12/11 0030  HGB 10.3* 12.0  HCT 31.8* 35.8*    Assessment/Plan: Discharge home, Breastfeeding and Contraception micronor      LOS: 3 days   Cisco Kindt M 01/14/2011, 8:30 AM

## 2011-03-12 ENCOUNTER — Encounter (INDEPENDENT_AMBULATORY_CARE_PROVIDER_SITE_OTHER): Payer: Medicaid Other | Admitting: Registered Nurse

## 2011-03-12 DIAGNOSIS — Z3049 Encounter for surveillance of other contraceptives: Secondary | ICD-10-CM

## 2011-03-12 DIAGNOSIS — Z30017 Encounter for initial prescription of implantable subdermal contraceptive: Secondary | ICD-10-CM

## 2011-04-02 ENCOUNTER — Encounter: Payer: Medicaid Other | Admitting: Registered Nurse

## 2011-07-27 ENCOUNTER — Emergency Department (HOSPITAL_COMMUNITY)
Admission: EM | Admit: 2011-07-27 | Discharge: 2011-07-28 | Disposition: A | Discharge status: Home / Self Care | Payer: Self-pay | Attending: Emergency Medicine | Admitting: Emergency Medicine

## 2011-07-27 ENCOUNTER — Encounter (HOSPITAL_COMMUNITY): Payer: Self-pay | Admitting: *Deleted

## 2011-07-27 DIAGNOSIS — L02411 Cutaneous abscess of right axilla: Secondary | ICD-10-CM

## 2011-07-27 DIAGNOSIS — IMO0002 Reserved for concepts with insufficient information to code with codable children: Secondary | ICD-10-CM | POA: Insufficient documentation

## 2011-07-27 DIAGNOSIS — M79609 Pain in unspecified limb: Secondary | ICD-10-CM | POA: Insufficient documentation

## 2011-07-28 MED ORDER — IBUPROFEN 800 MG PO TABS
800.0000 mg | ORAL_TABLET | Freq: Once | ORAL | Status: AC
  Administered 2011-07-28: 800 mg via ORAL
  Filled 2011-07-28: qty 1

## 2011-07-28 MED ORDER — IBUPROFEN 800 MG PO TABS: 800.0000 mg | ORAL_TABLET | Freq: Three times a day (TID) | ORAL | Status: AC | PRN

## 2011-07-28 MED ORDER — SULFAMETHOXAZOLE-TRIMETHOPRIM 800-160 MG PO TABS: 2.0000 | ORAL_TABLET | Freq: Two times a day (BID) | ORAL | Status: AC

## 2011-07-28 NOTE — ED Provider Notes (Signed)
History     CSN: [id number]  Arrival date & time 07/27/11  2241   First MD Initiated Contact with Patient 07/28/11 0224      Chief Complaint  Patient presents with  . Abscess    right under arm    (Consider location/radiation/quality/duration/timing/severity/associated sxs/prior treatment) HPI Comments: Ms. Carrasco is a 21 year old female who presents today with a bump in her right axilla. She first noticed the bump 4 days ago. The bump is painful to touch and has limited the range of motion in her right arm. She denies any recent changes in her deodorant or soap products. She denies exposures to insect bites. She denies exposures to others with similar symptoms.She has never had anything like this before. She denies fever, chills and fatigue. She denies shortness of breath.  She is otherwise healthy.   Patient is a 21 y.o. female presenting with abscess. The history is provided by the patient.  Abscess  Pertinent negatives include no fever.    Past Medical History  Diagnosis Date  . No pertinent past medical history   . History of chicken pox   . History of chlamydia   . 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 No past surgeries     Family History  Problem Relation Age of Onset  . Hypertension Mother   . Seizures Mother   . Hypertension Maternal Grandmother   . Cancer Paternal Grandmother   . Alzheimer's disease Paternal Grandmother   . Cancer Paternal Grandfather   . Anesthesia problems Neg Hx     History  Substance Use Topics  . Smoking status: Never Smoker   . Smokeless tobacco: Never Used  . Alcohol Use: No    OB History    Grav Para Term Preterm Abortions TAB SAB Ect Mult Living   1 1 1  0 0 0 0 0 0 1      Review of Systems  Constitutional: Negative for fever, chills and fatigue.  Respiratory: Negative for shortness of breath.   Skin: Negative for rash.    Allergies  Review of  patient's allergies indicates no known allergies.  Home Medications   Current Outpatient Rx  Name Route Sig Dispense Refill  . ETONOGESTREL 68 MG [place] IMPL Subcutaneous Inject 1 each into the skin once. Birth control      BP 117/65  Pulse 77  Temp 98.9 F (37.2 C) (Oral)  Resp 20  Ht 5\' 7"  (1.702 m)  Wt 130 lb (58.968 kg)  BMI 20.36 kg/m2  SpO2 100%  LMP 07/15/2011  Breastfeeding? No  Physical Exam  Nursing note and vitals reviewed. Constitutional: She appears well-developed and well-nourished. No distress.  HENT:  Head: Normocephalic and atraumatic.  Neck: Neck supple.  Pulmonary/Chest: Effort normal.  Musculoskeletal: Normal range of motion.  Neurological: She is alert. She exhibits normal muscle tone.  Skin: She is not diaphoretic.       Large area of induration with mild fluctuance, right axilla  Psychiatric: She has a normal mood and affect.    ED Course  Procedures (including critical care time)  Labs Reviewed - No data to display No results found.  INCISION AND DRAINAGE Performed by: Rise Patience Consent: Verbal consent obtained. Risks and benefits: risks, benefits and alternatives were discussed Type: abscess  Body area: right axilla  Anesthesia: local infiltration  Local anesthetic: lidocaine 2% no epinephrine  Anesthetic total: 4 ml  Complexity: complex Blunt dissection to break up loculations  Drainage: purulent  Drainage amount: small-moderate  Packing material: 1/4 in iodoform gauze  Patient tolerance: Patient tolerated the procedure well with no immediate complications.     1. Abscess of right axilla       MDM  Afebrile nontoxic patient with large swelling of right axilla - mostly indurated with some fluctuance.  I&D performed in ED with purulent/sanguinous discharge.  Abscess packed.  Discussed abscess care, to return to ED in 2 days for recheck and packing removal.  Return precautions given.  Patient verbalizes understanding and  agrees with plan.          Dillard Cannon [place], [place] 07/28/11 [phone]

## 2011-07-30 ENCOUNTER — Emergency Department (HOSPITAL_COMMUNITY)
Admission: EM | Admit: 2011-07-30 | Discharge: 2011-07-30 | Disposition: A | Discharge status: Home / Self Care | Payer: Self-pay | Attending: Emergency Medicine | Admitting: Emergency Medicine

## 2011-07-30 ENCOUNTER — Encounter (HOSPITAL_COMMUNITY): Payer: Self-pay | Admitting: *Deleted

## 2011-07-30 DIAGNOSIS — Z809 Family history of malignant neoplasm, unspecified: Secondary | ICD-10-CM | POA: Insufficient documentation

## 2011-07-30 DIAGNOSIS — Z4800 Encounter for change or removal of nonsurgical wound dressing: Secondary | ICD-10-CM | POA: Insufficient documentation

## 2011-07-30 DIAGNOSIS — Z820 Family history of epilepsy and other diseases of the nervous system: Secondary | ICD-10-CM | POA: Insufficient documentation

## 2011-07-30 DIAGNOSIS — Z8249 Family history of ischemic heart disease and other diseases of the circulatory system: Secondary | ICD-10-CM | POA: Insufficient documentation

## 2011-07-30 NOTE — ED Notes (Signed)
Pt presents for wound check and to have packing removed from R axilla abcess, drained on Saturday night.

## 2011-07-30 NOTE — ED Provider Notes (Signed)
History     CSN: [id number]  Arrival date & time 07/30/11  1300   First MD Initiated Contact with Patient 07/30/11 1321      Chief Complaint  Patient presents with  . Wound Check    (Consider location/radiation/quality/duration/timing/severity/associated sxs/prior treatment) HPI Comments: Patient presents for packing removal from right axillary abscess that she had drained in emergency department 2-3 days ago. Patient states she has had improvement in pain. She notes a small amount of drainage continuing from the wound. She's taking her antibiotics as prescribed. No systemic symptoms such as fever, nausea or vomiting. Onset was gradual. Course is gradually improving. Nothing makes symptoms worse.  Patient is a 21 y.o. female presenting with wound check. The history is provided by the patient.  Wound Check  She was treated in the ED 2 to 3 days ago. Previous treatment in the ED includes I&D of abscess. Treatments since wound repair include oral antibiotics. There has been colored discharge from the wound. There is no redness present. The swelling has improved. The pain has improved. She has no difficulty moving the affected extremity or digit.    Past Medical History  Diagnosis Date  . No pertinent past medical history   . History of chicken pox   . History of chlamydia   . 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 No past surgeries     Family History  Problem Relation Age of Onset  . Hypertension Mother   . Seizures Mother   . Hypertension Maternal Grandmother   . Cancer Paternal Grandmother   . Alzheimer's disease Paternal Grandmother   . Cancer Paternal Grandfather   . Anesthesia problems Neg Hx     History  Substance Use Topics  . Smoking status: Never Smoker   . Smokeless tobacco: Never Used  . Alcohol Use: No    OB History    Grav Para Term Preterm Abortions TAB SAB Ect Mult Living   1 1 1  0 0  0 0 0 0 1      Review of Systems  Constitutional: Negative for fever.  Gastrointestinal: Negative for nausea and vomiting.  Skin: Negative for color change.       Positive for abscess.  Hematological: Negative for adenopathy.    Allergies  Review of patient's allergies indicates no known allergies.  Home Medications   Current Outpatient Rx  Name Route Sig Dispense Refill  . ETONOGESTREL 68 MG [place] IMPL Subcutaneous Inject 1 each into the skin once. Birth control    . IBUPROFEN 800 MG PO TABS Oral Take 1 tablet (800 mg total) by mouth every 8 (eight) hours as needed for pain. 21 tablet 0  . SULFAMETHOXAZOLE-TRIMETHOPRIM 800-160 MG PO TABS Oral Take 2 tablets by mouth 2 (two) times daily. 28 tablet 0    BP 102/71  Pulse 102  Temp 99 F (37.2 C)  Resp 16  SpO2 96%  LMP 07/15/2011  Physical Exam  Nursing note and vitals reviewed. Constitutional: She appears well-developed and well-nourished.  HENT:  Head: Normocephalic and atraumatic.  Eyes: Conjunctivae are normal.  Neck: Normal range of motion. Neck supple.  Pulmonary/Chest: No respiratory distress.  Neurological: She is alert.  Skin: Skin is warm and dry.       2cm circular induration R axilla with packing in place, mild crusting. No surrounding erythema or cellulitis. No active drainage.  Psychiatric: She has a normal mood and affect.    ED Course  Procedures (including critical care time)  Labs Reviewed - No data to display No results found.   1. Abscess packing removal     1:35 PM Patient seen and examined. Packing removed. Patient to start warm soaks bid, continue abx.  Vital signs reviewed and are as follows: Filed Vitals:   07/30/11 1309  BP: 102/71  Pulse: 102  Temp: 99 F (37.2 C)  Resp: 16   1:35 PM The patient was urged to return to the Emergency Department urgently with worsening pain, swelling, expanding erythema especially if it streaks away from the affected area, fever, or if they have  any other concerns.   The patient was urged to return to the Emergency Department or go to their PCP in 48 hours for wound recheck if the area is not significantly improved.  The patient verbalized understanding and stated agreement with this plan.      MDM  Abscess, healing well, shallow, no need for repacking. Patient can do warm soaks. Continue abx until complete.         Renne Crigler, [place] 07/30/11 2153

## 2011-07-30 NOTE — ED Provider Notes (Signed)
Medical screening examination/treatment/procedure(s) were performed by non-physician practitioner and as supervising physician I was immediately available for consultation/collaboration.   Joya Gaskins, MD 07/30/11 2004

## 2011-07-30 NOTE — ED Notes (Signed)
Packing removed by PA 

## 2011-07-31 NOTE — ED Provider Notes (Signed)
Medical screening examination/treatment/procedure(s) were performed by non-physician practitioner and as supervising physician I was immediately available for consultation/collaboration.   Shivaan Tierno M Sabian Kuba, MD 07/31/11 2138 

## 2011-12-09 ENCOUNTER — Emergency Department (HOSPITAL_COMMUNITY)
Admission: EM | Admit: 2011-12-09 | Discharge: 2011-12-09 | Disposition: A | Discharge status: Home / Self Care | Payer: Self-pay | Attending: Emergency Medicine | Admitting: Emergency Medicine

## 2011-12-09 ENCOUNTER — Encounter (HOSPITAL_COMMUNITY): Payer: Self-pay | Admitting: Emergency Medicine

## 2011-12-09 DIAGNOSIS — L0291 Cutaneous abscess, unspecified: Secondary | ICD-10-CM

## 2011-12-09 DIAGNOSIS — IMO0002 Reserved for concepts with insufficient information to code with codable children: Secondary | ICD-10-CM | POA: Insufficient documentation

## 2011-12-09 MED ORDER — IBUPROFEN 800 MG PO TABS: 800.0000 mg | ORAL_TABLET | Freq: Three times a day (TID) | ORAL | Status: DC

## 2011-12-09 MED ORDER — CEPHALEXIN 500 MG PO CAPS: 500.0000 mg | ORAL_CAPSULE | Freq: Four times a day (QID) | ORAL | Status: DC

## 2011-12-09 MED ORDER — SULFAMETHOXAZOLE-TRIMETHOPRIM 800-160 MG PO TABS: 1.0000 | ORAL_TABLET | Freq: Two times a day (BID) | ORAL | Status: DC

## 2011-12-09 NOTE — ED Provider Notes (Signed)
Medical screening examination/treatment/procedure(s) were performed by non-physician practitioner and as supervising physician I was immediately available for consultation/collaboration.  Guerino Caporale, MD 12/09/11 2355 

## 2011-12-09 NOTE — ED Provider Notes (Signed)
History     CSN: [id number]  Arrival date & time 12/09/11  1815   First MD Initiated Contact with Patient 12/09/11 1928      Chief Complaint  Patient presents with  . Recurrent Skin Infections    (Consider location/radiation/quality/duration/timing/severity/associated sxs/prior treatment) HPI Comments: Patient presents with complaint of abscess under her right arm. She states that the area has been there for 3 days and is painful. Patient states that she place a piece of "fat back on it to bring it to a head". Denies fever or chills. Denies NVD or abdominal pain.   The history is provided by the patient. No language interpreter was used.    Past Medical History  Diagnosis Date  . No pertinent past medical history   . History of chicken pox   . History of chlamydia   . 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 No past surgeries     Family History  Problem Relation Age of Onset  . Hypertension Mother   . Seizures Mother   . Hypertension Maternal Grandmother   . Cancer Paternal Grandmother   . Alzheimer's disease Paternal Grandmother   . Cancer Paternal Grandfather   . Anesthesia problems Neg Hx     History  Substance Use Topics  . Smoking status: Never Smoker   . Smokeless tobacco: Never Used  . Alcohol Use: No    OB History    Grav Para Term Preterm Abortions TAB SAB Ect Mult Living   1 1 1  0 0 0 0 0 0 1      Review of Systems  Constitutional: Negative for fever and chills.  Gastrointestinal: Negative for nausea, vomiting, abdominal pain and diarrhea.    Allergies  Review of patient's allergies indicates no known allergies.  Home Medications   Current Outpatient Rx  Name  Route  Sig  Dispense  Refill  . CEPHALEXIN 500 MG PO CAPS   Oral   Take 1 capsule (500 mg total) by mouth 4 (four) times daily.   40 capsule   0   . ETONOGESTREL 68 MG [place] IMPL   Subcutaneous   Inject 1 each  into the skin once. Birth control         . IBUPROFEN 800 MG PO TABS   Oral   Take 1 tablet (800 mg total) by mouth 3 (three) times daily.   21 tablet   0   . SULFAMETHOXAZOLE-TRIMETHOPRIM 800-160 MG PO TABS   Oral   Take 1 tablet by mouth every 12 (twelve) hours.   20 tablet   0     BP 100/70  Pulse 76  Temp 99.7 F (37.6 C) (Oral)  Resp 18  SpO2 100%  LMP 11/20/2011  Physical Exam  Nursing note and vitals reviewed. Constitutional: She appears well-developed and well-nourished.  HENT:  Head: Normocephalic and atraumatic.  Mouth/Throat: Oropharynx is clear and moist.  Eyes: Conjunctivae normal and EOM are normal. No scleral icterus.  Neck: Normal range of motion. Neck supple.  Cardiovascular: Normal rate, regular rhythm and normal heart sounds.   Pulmonary/Chest: Effort normal and breath sounds normal.  Abdominal: Soft. Bowel sounds are normal. There is no tenderness.  Neurological: She is alert.  Skin: Skin is warm and dry.       2.5 cm of erythema and fluctuance under the right axilla.     ED Course  Procedures (including critical care time)  Labs Reviewed - No data to display No results found. INCISION AND DRAINAGE Performed by: Pixie Casino Consent: Verbal consent obtained. Risks and benefits: risks, benefits and alternatives were discussed Type: abscess  Body area: right axilla  Anesthesia: local infiltration  Incision was made with a scalpel.  Local anesthetic: lidocaine 1 % 2% epi  Anesthetic total: 3 ml  Complexity: complex Blunt dissection to break up loculations  Drainage: purulent  Drainage amount: 5 ml  Packing material: 1/4 in iodoform gauze  Patient tolerance: Patient tolerated the procedure well with no immediate complications.     1. Abscess       MDM  Patient presented with complaint of abscess. I&D successfully performed without complication. Patient given care instructions and discharged with Rx for ABX. Return  precautions given.        Pixie Casino, PA-C 12/09/11 2144

## 2011-12-09 NOTE — ED Notes (Signed)
Patient has boil to her right axilla region

## 2012-12-11 ENCOUNTER — Encounter (HOSPITAL_COMMUNITY): Payer: Self-pay | Admitting: Emergency Medicine

## 2012-12-11 ENCOUNTER — Emergency Department (HOSPITAL_COMMUNITY)
Admission: EM | Admit: 2012-12-11 | Discharge: 2012-12-11 | Disposition: A | Discharge status: Home / Self Care | Payer: Self-pay | Attending: Emergency Medicine | Admitting: Emergency Medicine

## 2012-12-11 DIAGNOSIS — Z79899 Other long term (current) drug therapy: Secondary | ICD-10-CM | POA: Insufficient documentation

## 2012-12-11 DIAGNOSIS — M65839 Other synovitis and tenosynovitis, unspecified forearm: Secondary | ICD-10-CM | POA: Insufficient documentation

## 2012-12-11 DIAGNOSIS — M778 Other enthesopathies, not elsewhere classified: Secondary | ICD-10-CM

## 2012-12-11 DIAGNOSIS — Z8619 Personal history of other infectious and parasitic diseases: Secondary | ICD-10-CM | POA: Insufficient documentation

## 2012-12-11 DIAGNOSIS — R011 Cardiac murmur, unspecified: Secondary | ICD-10-CM | POA: Insufficient documentation

## 2012-12-11 MED ORDER — PREDNISONE 20 MG PO TABS: ORAL_TABLET | ORAL | Status: DC

## 2012-12-11 NOTE — ED Notes (Signed)
Per pt, works in [employer] and states she now has left wrist/hand pain

## 2012-12-11 NOTE — ED Provider Notes (Signed)
CSN: [id number]     Arrival date & time 12/11/12  1191 History   First MD Initiated Contact with Patient 12/11/12 [phone]     Chief Complaint  Patient presents with  . Hand Pain   (Consider location/radiation/quality/duration/timing/severity/associated sxs/prior Treatment) HPI This 22 year old female has a few days gradual onset constant bilateral wrist pain at the volar region worse with hand grip and wrist flexion, she has localized tenderness without radiation without associated symptoms such as any weakness or numbness no trauma no redness or excessive warmth to the skin no rash, her symptoms are worse in the right wrist and her left, she performs a lot of repetitive movements at work, she is no neck pain back pain or other concerns. There is no treatment prior to arrival. Her symptoms are constant 24 hours a day for the last few days worse with usage of her hands and better with rest. She does not have any swollen or reddened joints. She does not have a history of arthritis. Past Medical History  Diagnosis Date  . No pertinent past medical history   . History of chicken pox   . History of chlamydia   . 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 No past surgeries     Family History  Problem Relation Age of Onset  . Hypertension Mother   . Seizures Mother   . Hypertension Maternal Grandmother   . Cancer Paternal Grandmother   . Alzheimer's disease Paternal Grandmother   . Cancer Paternal Grandfather   . Anesthesia problems Neg Hx    History  Substance Use Topics  . Smoking status: Never Smoker   . Smokeless tobacco: Never Used  . Alcohol Use: No   OB History   Grav Para Term Preterm Abortions TAB SAB Ect Mult Living   1 1 1  0 0 0 0 0 0 1     Review of Systems 10 Systems reviewed and are negative for acute change except as noted in the HPI. Allergies  Review of patient's allergies indicates no known  allergies.  Home Medications   Current Outpatient Rx  Name  Route  Sig  Dispense  Refill  . etonogestrel (IMPLANON) 68 MG IMPL implant   Subcutaneous   Inject 1 each into the skin once. Birth control         . naproxen sodium (ANAPROX) 220 MG tablet   Oral   Take 220 mg by mouth 2 (two) times daily with a meal.         . predniSONE (DELTASONE) 20 MG tablet      3 tabs po day one, then 2 tabs daily x 4 days   11 tablet   0    BP 110/58  Pulse 80  Temp(Src) 98.2 F (36.8 C) (Oral)  Resp 18  SpO2 100%  LMP 11/15/2012 Physical Exam  Nursing note and vitals reviewed. Constitutional:  Awake, alert, nontoxic appearance.  HENT:  Head: Atraumatic.  Eyes: Right eye exhibits no discharge. Left eye exhibits no discharge.  Neck: Neck supple.  Cervical spine nontender  Pulmonary/Chest: Effort normal. She exhibits no tenderness.  Abdominal: Soft. There is no tenderness. There is no rebound.  Musculoskeletal: She exhibits tenderness.  Baseline ROM, no obvious new focal weakness. Cervical spine nontender. Upper arms nontender. Bilateral elbows and proximal forearm is nontender. Distal forearms have tenderness isolated to the volar wrist regions over  the tendons, no bony or joint wrist tenderness no anatomic snuffbox tenderness, no bony or joint tenderness to the hands, both hands have normal light touch cap refill less than 2 seconds full range of motion intact strength and the distributions of the median radial and ulnar nerve function; no swelling erythema or excessive warmth to hands or wrists.  Neurological: She is alert.  Mental status and motor strength appears baseline for patient and situation.  Skin: No rash noted.  Psychiatric: She has a normal mood and affect.    ED Course  Procedures (including critical care time) History an examination consistent with bilateral wrist tendinitis with suspicion the patient may develop carpal tunnel syndrome. Patient informed of clinical  course, understand medical decision-making process, and agree with plan. Labs Review Labs Reviewed - No data to display Imaging Review No results found.  EKG Interpretation   None       MDM   1. Wrist tendonitis    I doubt any other EMC precluding discharge at this time including, but not necessarily limited to the following:neurovascular compromise.    Hurman Horn, MD 12/11/12 2034

## 2013-11-15 ENCOUNTER — Encounter (HOSPITAL_COMMUNITY): Payer: Self-pay | Admitting: Emergency Medicine

## 2014-04-03 ENCOUNTER — Emergency Department (HOSPITAL_COMMUNITY): Payer: Self-pay

## 2014-04-03 ENCOUNTER — Encounter (HOSPITAL_COMMUNITY): Payer: Self-pay | Admitting: *Deleted

## 2014-04-03 ENCOUNTER — Emergency Department (HOSPITAL_COMMUNITY)
Admission: EM | Admit: 2014-04-03 | Discharge: 2014-04-04 | Disposition: A | Discharge status: Home / Self Care | Payer: Self-pay | Attending: Emergency Medicine | Admitting: Emergency Medicine

## 2014-04-03 DIAGNOSIS — R011 Cardiac murmur, unspecified: Secondary | ICD-10-CM | POA: Insufficient documentation

## 2014-04-03 DIAGNOSIS — R079 Chest pain, unspecified: Secondary | ICD-10-CM

## 2014-04-03 DIAGNOSIS — Z8619 Personal history of other infectious and parasitic diseases: Secondary | ICD-10-CM | POA: Insufficient documentation

## 2014-04-03 DIAGNOSIS — R0602 Shortness of breath: Secondary | ICD-10-CM | POA: Insufficient documentation

## 2014-04-03 DIAGNOSIS — R0789 Other chest pain: Secondary | ICD-10-CM | POA: Insufficient documentation

## 2014-04-03 DIAGNOSIS — Z791 Long term (current) use of non-steroidal anti-inflammatories (NSAID): Secondary | ICD-10-CM | POA: Insufficient documentation

## 2014-04-03 NOTE — ED Provider Notes (Signed)
CSN: [id number]     Arrival date & time 04/03/14  2204 History   First MD Initiated Contact with Patient 04/03/14 2239     Chief Complaint  Patient presents with  . Chest Pain  . Shortness of Breath    (Consider location/radiation/quality/duration/timing/severity/associated sxs/prior Treatment) HPI Comments: Patient is a 24 year old female with no significant past medical history who presents to the emergency department for further evaluation of chest pain. Patient states that chest pain began his morning and has been constant, nonradiating, and described as a heaviness. Patient states that she can reproduce the pain on palpation to her right chest. She denies a history of similar symptoms. Patient has also noted that she has felt short of breath when exerting herself today. She states that she is on Implanon and denies any recent surgeries, hospitalizations, or pregnancies. Patient denies fever, personal or family history of DVT/PE, associated leg swelling, hemoptysis, vomiting, or syncope. She states that she believes she may have a heart murmur.  The history is provided by the patient. No language interpreter was used.    Past Medical History  Diagnosis Date  . No pertinent past medical history   . History of chicken pox   . History of chlamydia   . 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 No past surgeries     Family History  Problem Relation Age of Onset  . Hypertension Mother   . Seizures Mother   . Hypertension Maternal Grandmother   . Cancer Paternal Grandmother   . Alzheimer's disease Paternal Grandmother   . Cancer Paternal Grandfather   . Anesthesia problems Neg Hx    History  Substance Use Topics  . Smoking status: Never Smoker   . Smokeless tobacco: Never Used  . Alcohol Use: No   OB History    Gravida Para Term Preterm AB TAB SAB Ectopic Multiple Living   1 1 1  0 0 0 0 0 0 1       Review of Systems  Respiratory: Positive for shortness of breath.   Cardiovascular: Positive for chest pain.  All other systems reviewed and are negative.   Allergies  Review of patient's allergies indicates no known allergies.  Home Medications   Prior to Admission medications   Medication Sig Start Date End Date Taking? Authorizing Provider  etonogestrel (IMPLANON) 68 MG IMPL implant Inject 1 each into the skin once. Birth control    Historical Provider, MD  naproxen sodium (ANAPROX) 220 MG tablet Take 220 mg by mouth 2 (two) times daily with a meal.    Historical Provider, MD  predniSONE (DELTASONE) 20 MG tablet 3 tabs po day one, then 2 tabs daily x 4 days Patient not taking: Reported on 04/03/2014 12/11/12   Wayland SalinasJohn Bednar, MD   BP 105/54 mmHg  Pulse 68  Temp(Src) 98.5 F (36.9 C) (Oral)  Resp 22  Ht 5\' 7"  (1.702 m)  Wt 162 lb (73.483 kg)  BMI 25.37 kg/m2  SpO2 100%  LMP 03/15/2014   Physical Exam  Constitutional: She is oriented to person, place, and time. She appears well-developed and well-nourished. No distress.  Nontoxic/nonseptic appearing  HENT:  Head: Normocephalic and atraumatic.  Eyes: Conjunctivae and EOM are normal. No scleral icterus.  Neck: Normal range of motion.  No JVD  Cardiovascular: Normal rate, regular rhythm and intact distal pulses.   Pulmonary/Chest: Effort normal and breath  sounds normal. No respiratory distress. She has no wheezes. She has no rales. She exhibits tenderness.  Respirations even and unlabored. Lungs clear. Tenderness to palpation to the right central chest without crepitus.  Musculoskeletal: Normal range of motion.  Neurological: She is alert and oriented to person, place, and time. She exhibits normal muscle tone. Coordination normal.  Skin: Skin is warm and dry. No rash noted. She is not diaphoretic. No erythema. No pallor.  Psychiatric: She has a normal mood and affect. Her behavior is normal.  Nursing note and vitals  reviewed.   ED Course  Procedures (including critical care time) Labs Review Labs Reviewed  D-DIMER, QUANTITATIVE  I-STAT TROPOININ, ED    Imaging Review Dg Chest 2 View  04/03/2014   CLINICAL DATA:  Chest pain and dyspnea  EXAM: CHEST  2 VIEW  COMPARISON:  None.  FINDINGS: The heart size and mediastinal contours are within normal limits. Both lungs are clear. The visualized skeletal structures are unremarkable.  IMPRESSION: No active cardiopulmonary disease.   Electronically Signed   By: Ellery Plunk M.D.   On: 04/03/2014 23:43     EKG Interpretation   Date/Time:  Sunday April 03 2014 22:14:51 EDT Ventricular Rate:  67 PR Interval:  180 QRS Duration: 83 QT Interval:  394 QTC Calculation: 416 R Axis:   95 Text Interpretation:  Ectopic atrial rhythm Consider right ventricular  hypertrophy No significant change since last tracing Confirmed by KNAPP   MD-J, JON ([id number]) on 04/03/2014 10:24:55 PM      MDM   Final diagnoses:  Chest pain    24 year old female presents to the emergency department for further evaluation of right-sided chest pain. Pain began today. Patient has a nonischemic EKG. Troponin negative. Chest x-ray is also unremarkable. Patient is otherwise healthy with no chronic medical problems. She has a d-dimer pending given her use of birth control and associated dyspnea on exertion. Believe patient can be safely discharged with supportive treatment for MSK etiology if dimer negative. If dimer positive, would pursue further work up with CT. Patient signed out to Earley Favor, NP at shift change who will f/u on Dimer and disposition appropriately.   Filed Vitals:   04/03/14 2208 04/03/14 2225 04/03/14 2300  BP: 115/53 118/61 105/54  Pulse: 84 73 68  Temp: 98.5 F (36.9 C)    TempSrc: Oral    Resp: Height:  (1.702 m)    Weight: 162 lb (73.483 kg)    SpO2: 99% 100% 100%     Antony Madura, PA-C 04/04/14 0138  0140 - Dimer resulted negative.  Will d/c with Naproxen and instruction for PCP f/u. Doubt emergent pathology.  Antony Madura, PA-C 04/04/14 0139  Marisa Severin, MD 04/04/14 [phone]

## 2014-04-03 NOTE — ED Notes (Signed)
Patient with c/o CP and SOB Patient seen by PCP on Tuesday and "she said I had a murmur or something. I don't know. Something about a splash?" Patient also with c/o SOB Patient ambulatory from ED WR without difficulty No SOB or DOE during or after ambulation Patient rates CP 8-9/10 on pain scale Patient alert and oriented x 4 and in NAD

## 2014-04-03 NOTE — ED Notes (Signed)
Patient still in registration--will get EKG when back in triage room

## 2014-04-03 NOTE — ED Notes (Signed)
Patient had tenderness to palpation to slight right side of chest.

## 2014-04-04 LAB — D-DIMER, QUANTITATIVE (NOT AT ARMC)

## 2014-04-04 LAB — I-STAT TROPONIN, ED: Troponin i, poc: 0 ng/mL (ref 0.00–0.08)

## 2014-04-04 MED ORDER — NAPROXEN 500 MG PO TABS: 500.0000 mg | ORAL_TABLET | Freq: Two times a day (BID) | ORAL | Status: DC

## 2014-04-04 NOTE — Discharge Instructions (Signed)

## 2015-04-16 ENCOUNTER — Emergency Department (HOSPITAL_COMMUNITY)
Admission: EM | Admit: 2015-04-16 | Discharge: 2015-04-16 | Disposition: A | Discharge status: Home / Self Care | Payer: Medicaid Other | Attending: Emergency Medicine | Admitting: Emergency Medicine

## 2015-04-16 ENCOUNTER — Encounter (HOSPITAL_COMMUNITY): Payer: Self-pay | Admitting: *Deleted

## 2015-04-16 DIAGNOSIS — R05 Cough: Secondary | ICD-10-CM | POA: Diagnosis not present

## 2015-04-16 DIAGNOSIS — R42 Dizziness and giddiness: Secondary | ICD-10-CM | POA: Diagnosis not present

## 2015-04-16 DIAGNOSIS — R011 Cardiac murmur, unspecified: Secondary | ICD-10-CM | POA: Insufficient documentation

## 2015-04-16 DIAGNOSIS — R0981 Nasal congestion: Secondary | ICD-10-CM | POA: Diagnosis present

## 2015-04-16 NOTE — ED Notes (Signed)
PT did not answer to name called

## 2015-04-16 NOTE — ED Notes (Signed)
Pt complains of scratchy throat, nasal congestion and cough for the past 2 days. Pt states she felt hot and dizzy while shopping today, pt has not been taking temperature. Pt states her symptoms are worse at night. Pt took Claritin and Mucinex yesterday. Pt states she has had seasonal allergies since her daughter was born.

## 2015-04-17 ENCOUNTER — Emergency Department (HOSPITAL_COMMUNITY)
Admission: EM | Admit: 2015-04-17 | Discharge: 2015-04-17 | Disposition: A | Discharge status: Home / Self Care | Payer: Medicaid Other | Attending: Emergency Medicine | Admitting: Emergency Medicine

## 2015-04-17 ENCOUNTER — Encounter (HOSPITAL_COMMUNITY): Payer: Self-pay | Admitting: Emergency Medicine

## 2015-04-17 DIAGNOSIS — Z8619 Personal history of other infectious and parasitic diseases: Secondary | ICD-10-CM | POA: Diagnosis not present

## 2015-04-17 DIAGNOSIS — Z791 Long term (current) use of non-steroidal anti-inflammatories (NSAID): Secondary | ICD-10-CM | POA: Insufficient documentation

## 2015-04-17 DIAGNOSIS — R05 Cough: Secondary | ICD-10-CM | POA: Diagnosis present

## 2015-04-17 DIAGNOSIS — R011 Cardiac murmur, unspecified: Secondary | ICD-10-CM | POA: Insufficient documentation

## 2015-04-17 DIAGNOSIS — J069 Acute upper respiratory infection, unspecified: Secondary | ICD-10-CM

## 2015-04-17 MED ORDER — CETIRIZINE-PSEUDOEPHEDRINE ER 5-120 MG PO TB12: 1.0000 | ORAL_TABLET | Freq: Every day | ORAL | Status: DC

## 2015-04-17 NOTE — ED Notes (Signed)
C/o cough and runny nose at night over the last week. States she had started with a sore throat but that it went away. Did not take any OTC medication prior to arrival.

## 2015-04-17 NOTE — Discharge Instructions (Signed)
Upper Respiratory Infection, Adult Most upper respiratory infections (URIs) are a viral infection of the air passages leading to the lungs. A URI affects the nose, throat, and upper air passages. The most common type of URI is nasopharyngitis and is typically referred to as "the common cold." URIs run their course and usually go away on their own. Most of the time, a URI does not require medical attention, but sometimes a bacterial infection in the upper airways can follow a viral infection. This is called a secondary infection. Sinus and middle ear infections are common types of secondary upper respiratory infections. Bacterial pneumonia can also complicate a URI. A URI can worsen asthma and chronic obstructive pulmonary disease (COPD). Sometimes, these complications can require emergency medical care and may be life threatening.  CAUSES Almost all URIs are caused by viruses. A virus is a type of germ and can spread from one person to another.  RISKS FACTORS You may be at risk for a URI if:   You smoke.   You have chronic heart or lung disease.  You have a weakened defense (immune) system.   You are very young or very old.   You have nasal allergies or asthma.  You work in crowded or poorly ventilated areas.  You work in health care facilities or schools. SIGNS AND SYMPTOMS  Symptoms typically develop 2-3 days after you come in contact with a cold virus. Most viral URIs last 7-10 days. However, viral URIs from the influenza virus (flu virus) can last 14-18 days and are typically more severe. Symptoms may include:   Runny or stuffy (congested) nose.   Sneezing.   Cough.   Sore throat.   Headache.   Fatigue.   Fever.   Loss of appetite.   Pain in your forehead, behind your eyes, and over your cheekbones (sinus pain).  Muscle aches.  DIAGNOSIS  Your health care provider may diagnose a URI by:  Physical exam.  Tests to check that your symptoms are not due to  another condition such as:  Strep throat.  Sinusitis.  Pneumonia.  Asthma. TREATMENT  A URI goes away on its own with time. It cannot be cured with medicines, but medicines may be prescribed or recommended to relieve symptoms. Medicines may help:  Reduce your fever.  Reduce your cough.  Relieve nasal congestion. HOME CARE INSTRUCTIONS   Take medicines only as directed by your health care provider.   Gargle warm saltwater or take cough drops to comfort your throat as directed by your health care provider.  Use a warm mist humidifier or inhale steam from a shower to increase air moisture. This may make it easier to breathe.  Drink enough fluid to keep your urine clear or pale yellow.   Eat soups and other clear broths and maintain good nutrition.   Rest as needed.   Return to work when your temperature has returned to normal or as your health care provider advises. You may need to stay home longer to avoid infecting others. You can also use a face mask and careful hand washing to prevent spread of the virus.  Increase the usage of your inhaler if you have asthma.   Do not use any tobacco products, including cigarettes, chewing tobacco, or electronic cigarettes. If you need help quitting, ask your health care provider. PREVENTION  The best way to protect yourself from getting a cold is to practice good hygiene.   Avoid oral or hand contact with people with cold   symptoms.   Wash your hands often if contact occurs.  There is no clear evidence that vitamin C, vitamin E, echinacea, or exercise reduces the chance of developing a cold. However, it is always recommended to get plenty of rest, exercise, and practice good nutrition.  SEEK MEDICAL CARE IF:   You are getting worse rather than better.   Your symptoms are not controlled by medicine.   You have chills.  You have worsening shortness of breath.  You have brown or red mucus.  You have yellow or brown nasal  discharge.  You have pain in your face, especially when you bend forward.  You have a fever.  You have swollen neck glands.  You have pain while swallowing.  You have white areas in the back of your throat. SEEK IMMEDIATE MEDICAL CARE IF:   You have severe or persistent:  Headache.  Ear pain.  Sinus pain.  Chest pain.  You have chronic lung disease and any of the following:  Wheezing.  Prolonged cough.  Coughing up blood.  A change in your usual mucus.  You have a stiff neck.  You have changes in your:  Vision.  Hearing.  Thinking.  Mood. MAKE SURE YOU:   Understand these instructions.  Will watch your condition.  Will get help right away if you are not doing well or get worse.   This information is not intended to replace advice given to you by your health care provider. Make sure you discuss any questions you have with your health care provider.   Document Released: 06/26/2000 Document Revised: 05/17/2014 Document Reviewed: 04/07/2013 Elsevier Interactive Patient Education 2016 [employer]  

## 2015-04-17 NOTE — ED Provider Notes (Signed)
CSN: [id number]     Arrival date & time 04/17/15  0912 History   First MD Initiated Contact with Patient 04/17/15 0945     Chief Complaint  Patient presents with  . Nasal Congestion  . Cough     (Consider location/radiation/quality/duration/timing/severity/associated sxs/prior Treatment) HPI Comments: 25 year old female presents with congestion for 5 days. She reports associated chills, headache, scratchy throat. Denies fever, ear pain, rhinorrhea, chest pain, shortness of breath, abdominal pain, nausea, vomiting, diarrhea, dysuria. She has been taking Claritin and NyQuil with moderate relief.   Patient is a 25 y.o. female presenting with cough.  Cough Associated symptoms: chills   Associated symptoms: no chest pain, no ear pain, no fever, no rhinorrhea, no shortness of breath, no sore throat and no wheezing     Past Medical History  Diagnosis Date  . No pertinent past medical history   . History of chicken pox   . History of chlamydia   . 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 No past surgeries     Family History  Problem Relation Age of Onset  . Hypertension Mother   . Seizures Mother   . Hypertension Maternal Grandmother   . Cancer Paternal Grandmother   . Alzheimer's disease Paternal Grandmother   . Cancer Paternal Grandfather   . Anesthesia problems Neg Hx    Social History  Substance Use Topics  . Smoking status: Never Smoker   . Smokeless tobacco: Never Used  . Alcohol Use: Yes   OB History    Gravida Para Term Preterm AB TAB SAB Ectopic Multiple Living   0 0 0 0 0 0 1     Review of Systems  Constitutional: Positive for chills. Negative for fever.  HENT: Positive for congestion and sinus pressure. Negative for ear pain, rhinorrhea and sore throat.   Respiratory: Positive for cough. Negative for shortness of breath and wheezing.   Cardiovascular: Negative for chest pain.   Gastrointestinal: Negative for nausea, vomiting, abdominal pain and diarrhea.  Genitourinary: Negative for dysuria.      Allergies  Review of patient's allergies indicates no known allergies.  Home Medications   Prior to Admission medications   Medication Sig Start Date End Date Taking? Authorizing Provider  cetirizine-pseudoephedrine (ZYRTEC-D) 5-120 MG tablet Take 1 tablet by mouth daily. 04/17/15   Bethel Born, PA-C  etonogestrel (IMPLANON) 68 MG IMPL implant Inject 1 each into the skin once. Birth control    Historical Provider, MD  naproxen (NAPROSYN) 500 MG tablet Take 1 tablet (500 mg total) by mouth 2 (two) times daily. 04/04/14   Antony Madura, PA-C  naproxen sodium (ANAPROX) 220 MG tablet Take 220 mg by mouth 2 (two) times daily with a meal.    Historical Provider, MD   BP 104/62 mmHg  Pulse 89  Temp(Src) 98.4 F (36.9 C) (Oral)  Resp 18  SpO2 98%  LMP 04/08/2015   Physical Exam  Constitutional: She is oriented to person, place, and time. She appears well-developed and well-nourished. No distress.  HENT:  Head: Normocephalic and atraumatic.  Right Ear: Hearing, tympanic membrane, external ear and ear canal normal.  Left Ear: Hearing, tympanic membrane, external ear and ear canal normal.  Nose: No rhinorrhea.  Mouth/Throat: Uvula is midline and mucous membranes are normal. Posterior oropharyngeal erythema present.  Eyes: Pupils are equal, round, and reactive to light.  Pulmonary/Chest: Effort normal.  Neurological: She is alert and oriented to person, place, and time.  Skin: Skin is warm and dry.  Psychiatric: She has a normal mood and affect.    ED Course  Procedures (including critical care time) Labs Review Labs Reviewed - No data to display  Imaging Review No results found. I have personally reviewed and evaluated these images and lab results as part of my medical decision-making.   EKG Interpretation None      MDM   Final diagnoses:  URI  (upper respiratory infection)   25 year old female presents with URI-like symptoms. She is afebrile, NAD, non-toxic. OTC meds were helpful. Her main complaint is congestion, and she has history of allergies. We'll give decongestant. Return precautions given.    Bethel BornKelly Marie Eligio Angert, PA-C 04/17/15 1112  Raeford RazorStephen Kohut, MD 04/19/15 [phone]

## 2015-09-09 ENCOUNTER — Encounter (HOSPITAL_COMMUNITY): Payer: Self-pay | Admitting: Emergency Medicine

## 2015-09-09 ENCOUNTER — Ambulatory Visit (HOSPITAL_COMMUNITY)
Admission: EM | Admit: 2015-09-09 | Discharge: 2015-09-09 | Disposition: A | Discharge status: Home / Self Care | Payer: Medicaid Other

## 2015-09-09 DIAGNOSIS — M546 Pain in thoracic spine: Secondary | ICD-10-CM | POA: Diagnosis not present

## 2015-09-09 MED ORDER — DICLOFENAC SODIUM 75 MG PO TBEC: 75.0000 mg | DELAYED_RELEASE_TABLET | Freq: Two times a day (BID) | ORAL | 1 refills | Status: DC

## 2015-09-09 NOTE — Discharge Instructions (Signed)
You probably have irritation along the scapular notch. There is a bundle of nerves that can get irritated. Suggest use of Diclofenac twice a day with food x 2 weeks. Suggest gentle stretching and massage with creams such as Aspercreme with lidocaine. If you are not improving then f/u with Orthopedics. Feel better

## 2015-09-09 NOTE — ED Triage Notes (Signed)
Pain in upper back , onset last week of upper back pain.  Pain also occurs in lower back.   Movement makes this pain worse.  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  Pain is sharp.

## 2015-09-09 NOTE — ED Provider Notes (Signed)
CSN: [id number]     Arrival date & time 09/09/15  1413 History   First MD Initiated Contact with Patient 09/09/15 1556     No chief complaint on file.  (Consider location/radiation/quality/duration/timing/severity/associated sxs/prior Treatment) 25 yo female presents with left upper back pain x 1 week. She also has a history of mild intermittent low back pain since her epidural. She started a job at [employer] that requires a lot of standing. She thinks this may have started her discomfort. She has no trauma. No urinary symptoms. Pain with ROM, and after working cannot sleep at night due to discomfort. No extremity pain, weakness.     Past Medical History:  Diagnosis Date  . 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 History of chicken pox   . History of chlamydia   . No pertinent past medical history    Past Surgical History:  Procedure Laterality Date  . NO PAST SURGERIES     Family History  Problem Relation Age of Onset  . Hypertension Mother   . Seizures Mother   . Hypertension Maternal Grandmother   . Cancer Paternal Grandmother   . Alzheimer's disease Paternal Grandmother   . Cancer Paternal Grandfather   . Anesthesia problems Neg Hx    Social History  Substance Use Topics  . Smoking status: Never Smoker  . Smokeless tobacco: Never Used  . Alcohol use Yes   OB History    Gravida Para Term Preterm AB Living   1 1 1  0 0 1   SAB TAB Ectopic Multiple Live Births   0 0 0 0 1     Review of Systems  Constitutional: Negative for chills and fever.  Genitourinary: Negative.   Musculoskeletal: Positive for back pain and myalgias.  Skin: Negative.     Allergies  Review of patient's allergies indicates no known allergies.  Home Medications   Prior to Admission medications   Medication Sig Start Date End Date Taking? Authorizing Provider  acetaminophen (TYLENOL) 325 MG tablet Take 650 mg by mouth every 6 (six) hours as needed.   Yes Historical  Provider, MD  etonogestrel (NEXPLANON) 68 MG IMPL implant 1 each by Subdermal route once.   Yes Historical Provider, MD  cetirizine-pseudoephedrine (ZYRTEC-D) 5-120 MG tablet Take 1 tablet by mouth daily. 04/17/15   Bethel Born, PA-C  diclofenac (VOLTAREN) 75 MG EC tablet Take 1 tablet (75 mg total) by mouth 2 (two) times daily. 09/09/15   Riki Sheer, PA-C  etonogestrel (IMPLANON) 68 MG IMPL implant Inject 1 each into the skin once. Birth control    Historical Provider, MD   Meds Ordered and Administered this Visit  Medications - No data to display  BP 114/59 (BP Location: Left Arm)   Pulse 65   Temp 98.9 F (37.2 C) (Oral)   Resp 16   LMP 07/16/2015   SpO2 100%  No data found.   Physical Exam  Constitutional: She is oriented to person, place, and time. She appears well-developed and well-nourished. No distress.  Cardiovascular: Normal rate.   Pulmonary/Chest: Effort normal and breath sounds normal.  Musculoskeletal:  Tenderness and mild spasm of the left upper para thoracic muscle, no spinal tenderness. No tenderness in the lower spine with full ROM and normal gait and strength in the lower extremities  Neurological: She is alert and oriented to person, place, and time. She exhibits normal muscle tone. Coordination normal.  Skin: Skin is warm  and dry. No rash noted. She is not diaphoretic. No erythema.  Psychiatric: Her behavior is normal.  Nursing note and vitals reviewed.   Urgent Care Course   Clinical Course    Procedures (including critical care time)  Labs Review Labs Reviewed - No data to display  Imaging Review No results found.   Visual Acuity Review  Right Eye Distance:   Left Eye Distance:   Bilateral Distance:    Right Eye Near:   Left Eye Near:    Bilateral Near:         MDM   1. Right-sided thoracic back pain    Musculoskeletal pain and irritation most likely from new position. No neuro deficit and no emergent needs. Treat with  gentle ROM and stretching. Treat with NSAID x 1-2 weeks. Suggest use of NSAID topicals and gentle massage if available for her.  If she worsens then schedule with Orthopedics. Stable for D/C.    Riki SheerMichelle G Sabel Hornbeck, PA-C 09/09/15 1644

## 2016-02-21 ENCOUNTER — Emergency Department (HOSPITAL_COMMUNITY)
Admission: EM | Admit: 2016-02-21 | Discharge: 2016-02-21 | Disposition: A | Discharge status: Home / Self Care | Payer: Medicaid Other

## 2016-02-22 ENCOUNTER — Ambulatory Visit (HOSPITAL_COMMUNITY)
Admission: EM | Admit: 2016-02-22 | Discharge: 2016-02-22 | Disposition: A | Discharge status: Home / Self Care | Payer: Medicaid Other | Attending: Emergency Medicine | Admitting: Emergency Medicine

## 2016-02-22 ENCOUNTER — Encounter (HOSPITAL_COMMUNITY): Payer: Self-pay | Admitting: Emergency Medicine

## 2016-02-22 DIAGNOSIS — F410 Panic disorder [episodic paroxysmal anxiety] without agoraphobia: Secondary | ICD-10-CM

## 2016-02-22 MED ORDER — HYDROXYZINE HCL 25 MG PO TABS: 25.0000 mg | ORAL_TABLET | Freq: Four times a day (QID) | ORAL | 0 refills | Status: DC

## 2016-02-22 NOTE — ED Provider Notes (Signed)
CSN: [id number]     Arrival date & time 02/22/16  1024 History   First MD Initiated Contact with Patient 02/22/16 1136     Chief Complaint  Patient presents with  . Anxiety   (Consider location/radiation/quality/duration/timing/severity/associated sxs/prior Treatment) 26 year old female presents to clinic with chief complaint of anxiety attack. She reports she was in a verbal altercation at work yesterday, started breathing rapidly, had chest pains and shaking. Her symptoms have since resolved but she is concered about future attacks.   The history is provided by the patient.  Anxiety     Past Medical History:  Diagnosis Date  . 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 History of chicken pox   . History of chlamydia   . No pertinent past medical history    Past Surgical History:  Procedure Laterality Date  . NO PAST SURGERIES     Family History  Problem Relation Age of Onset  . Hypertension Mother   . Seizures Mother   . Hypertension Maternal Grandmother   . Cancer Paternal Grandmother   . Alzheimer's disease Paternal Grandmother   . Cancer Paternal Grandfather   . Anesthesia problems Neg Hx    Social History  Substance Use Topics  . Smoking status: Never Smoker  . Smokeless tobacco: Never Used  . Alcohol use Yes   OB History    Gravida Para Term Preterm AB Living   1 1 1  0 0 1   SAB TAB Ectopic Multiple Live Births   0 0 0 0 1     Review of Systems  Reason unable to perform ROS: as covered in HPI.  All other systems reviewed and are negative.   Allergies  Patient has no known allergies.  Home Medications   Prior to Admission medications   Medication Sig Start Date End Date Taking? Authorizing Provider  etonogestrel (IMPLANON) 68 MG IMPL implant Inject 1 each into the skin once. Birth control   Yes Historical Provider, MD  acetaminophen (TYLENOL) 325 MG tablet Take 650 mg by mouth every 6 (six) hours as needed.    Historical  Provider, MD  cetirizine-pseudoephedrine (ZYRTEC-D) 5-120 MG tablet Take 1 tablet by mouth daily. 04/17/15   Bethel BornKelly Marie Gekas, PA-C  diclofenac (VOLTAREN) 75 MG EC tablet Take 1 tablet (75 mg total) by mouth 2 (two) times daily. 09/09/15   Riki SheerMichelle G Young, PA-C  etonogestrel (NEXPLANON) 68 MG IMPL implant 1 each by Subdermal route once.    Historical Provider, MD  hydrOXYzine (ATARAX/VISTARIL) 25 MG tablet Take 1 tablet (25 mg total) by mouth every 6 (six) hours. 02/22/16   Dorena BodoLawrence Prithvi Kooi, NP   Meds Ordered and Administered this Visit  Medications - No data to display  BP 118/59 (BP Location: Right Arm)   Pulse 66   Temp 98.3 F (36.8 C) (Oral)   Resp 20   SpO2 100%  No data found.   Physical Exam  Constitutional: She is oriented to person, place, and time. She appears well-developed and well-nourished. No distress.  HENT:  Head: Normocephalic and atraumatic.  Right Ear: External ear normal.  Left Ear: External ear normal.  Mouth/Throat: Oropharynx is clear and moist.  Cardiovascular: Normal rate and regular rhythm.   Pulmonary/Chest: Effort normal and breath sounds normal.  Abdominal: Soft. Bowel sounds are normal.  Neurological: She is alert and oriented to person, place, and time.  Skin: Skin is warm and dry. Capillary refill takes  less than 2 seconds. She is not diaphoretic.  Psychiatric: She has a normal mood and affect. Her behavior is normal. Thought content normal.  Nursing note and vitals reviewed.   Urgent Care Course     Procedures (including critical care time)  Labs Review Labs Reviewed - No data to display  Imaging Review No results found.   Visual Acuity Review  Right Eye Distance:   Left Eye Distance:   Bilateral Distance:    Right Eye Near:   Left Eye Near:    Bilateral Near:         MDM   1. Anxiety attack   You have had an anxiety attack, I have included a handout in your discharge paper discussing panic attacks, I have sent a  prescription to your pharmacy for Atarax, you may take 1 tablet every 6 hours as needed for your symptoms. Should your symptoms continue, follow up with your primary care provider for longer term management.      Dorena Bodo, NP 02/22/16 1152

## 2016-02-22 NOTE — ED Triage Notes (Signed)
Here for anxiety/pannic attack onset 4 days  Reports she was at work and was involved in an verbal altercation w/coworker and later on that night she had some CP... Coworker found her in the bathroom at work  EMS came out and treated her for anxiety/pannic attack  Here for intermittnet persistent CP and increases at night  A&O x4... NAD

## 2016-02-22 NOTE — Discharge Instructions (Signed)
You have had an anxiety attack, I have included a handout in your discharge paper discussing panic attacks, I have sent a prescription to your pharmacy for Atarax, you may take 1 tablet every 6 hours as needed for your symptoms. Should your symptoms continue, follow up with your primary care provider for longer term management.

## 2016-04-07 IMAGING — CR DG CHEST 2V
2 series · 2 of 2 positions shown · non-contrast
Comparison: None.

CLINICAL DATA: Chest pain and dyspnea

EXAM:
CHEST  2 VIEW

[w chest pa]
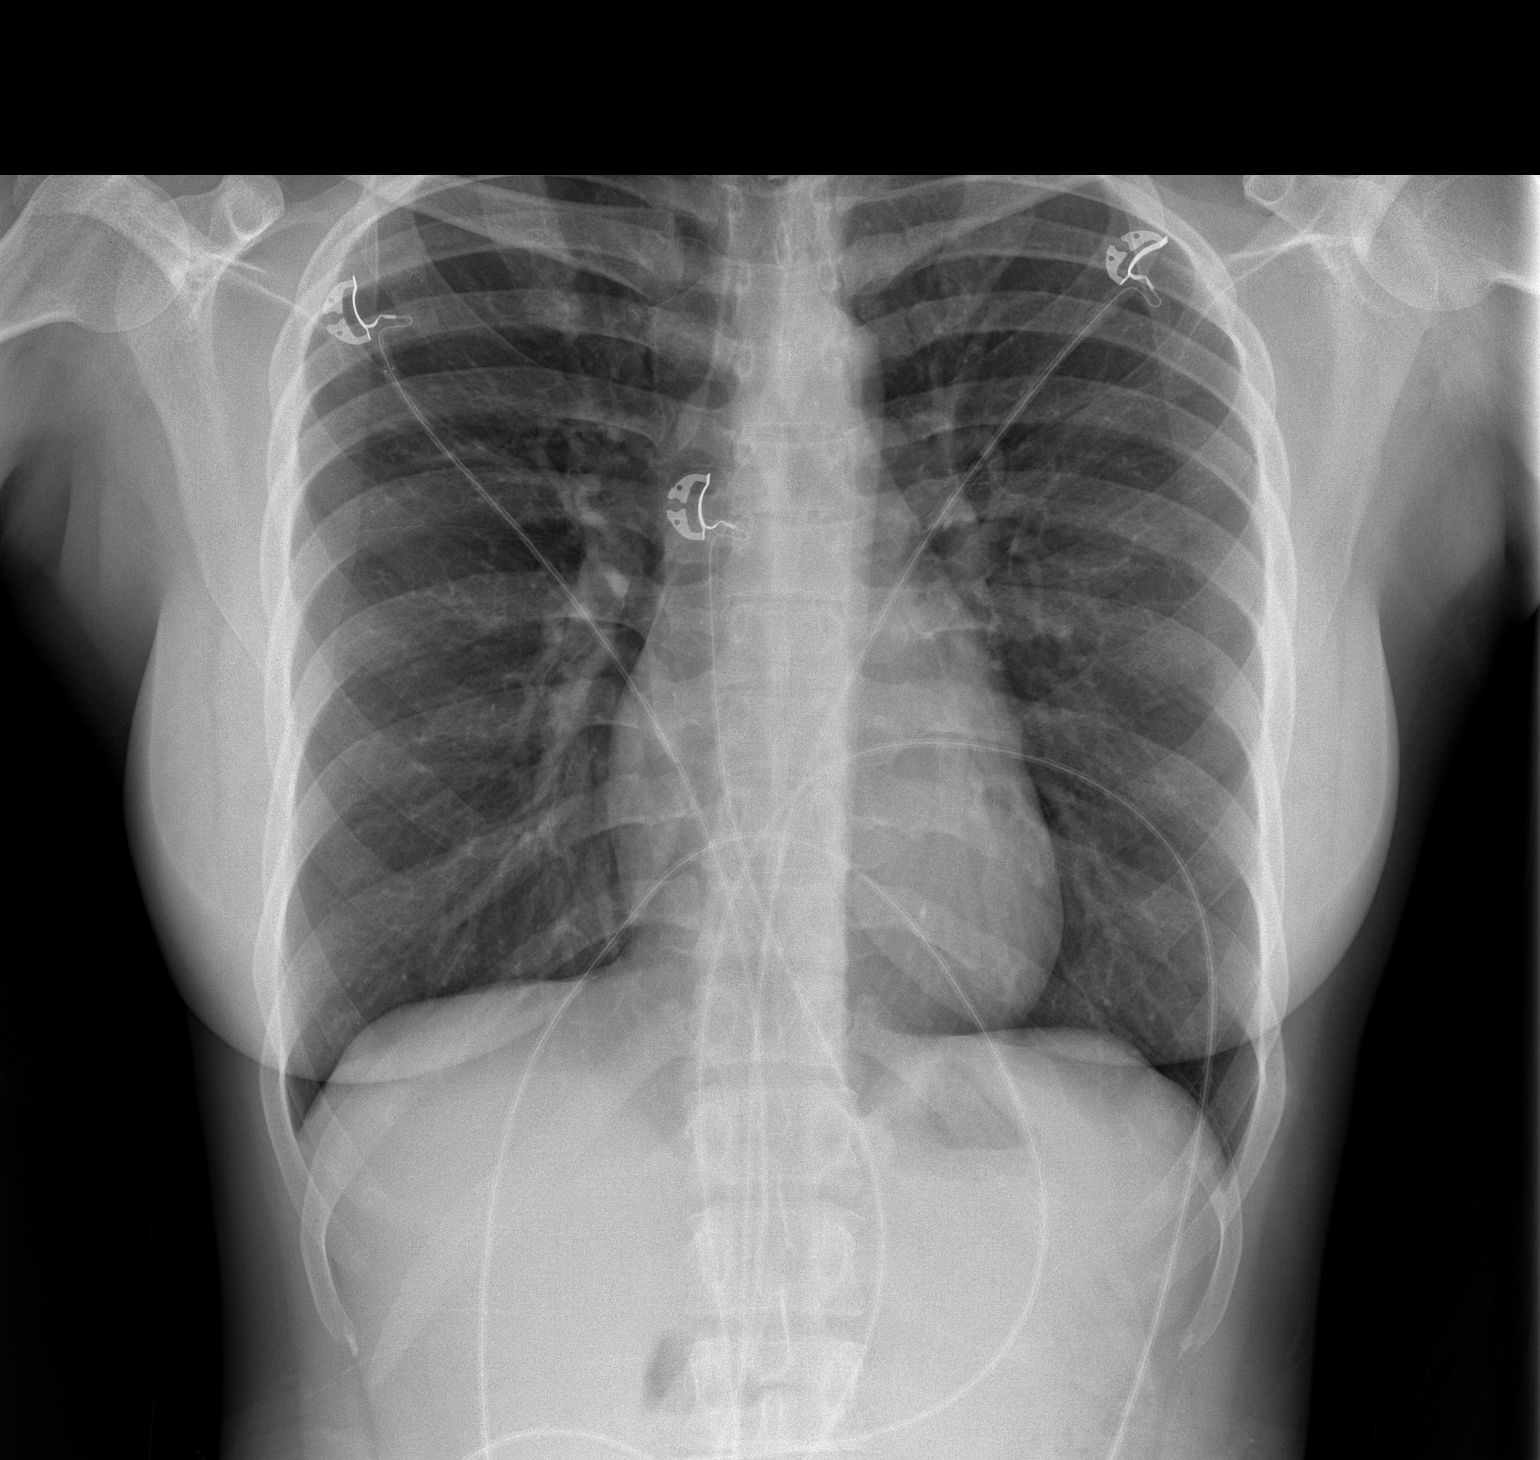

[w chest lat]
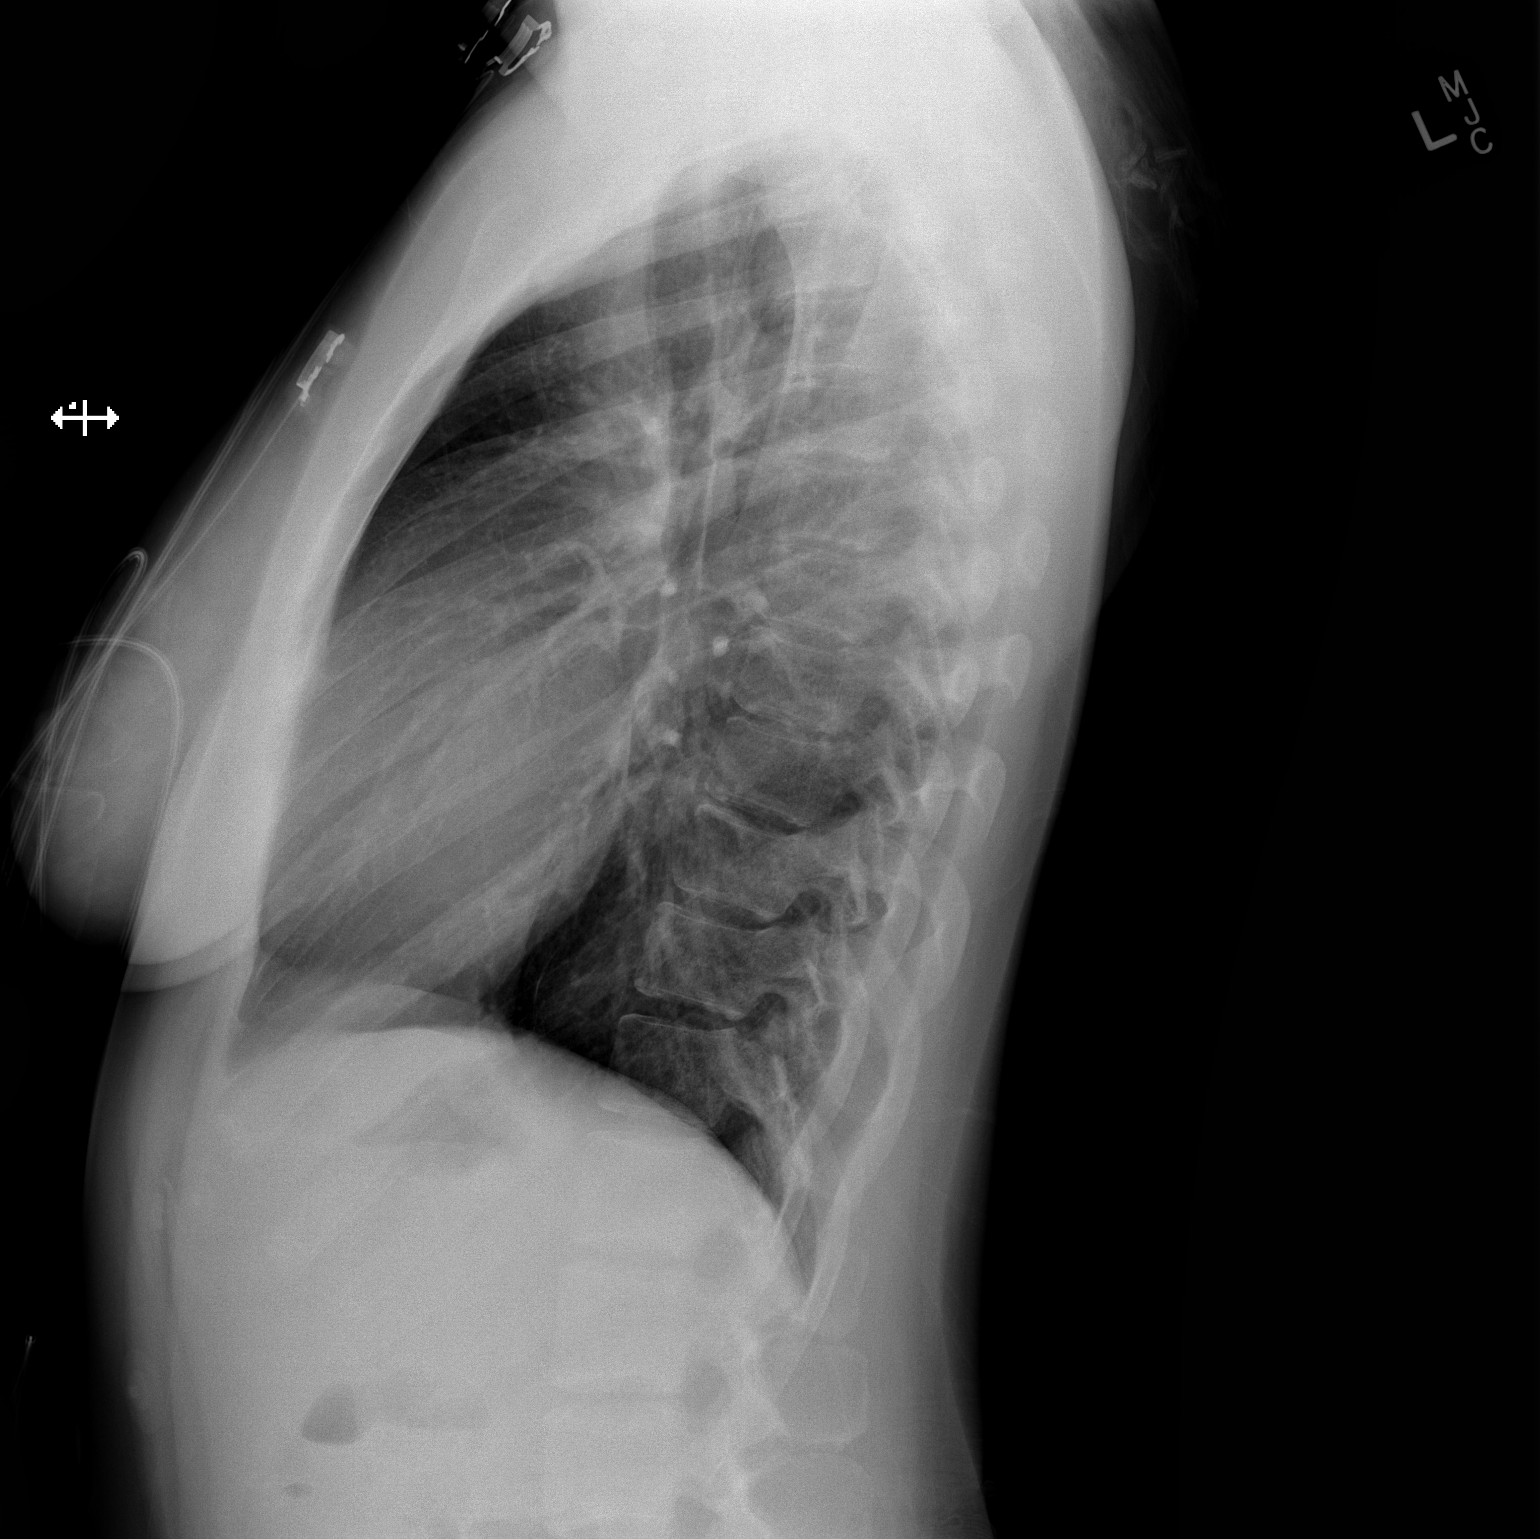

[2 of 2 positions shown; findings below may reference images not displayed]

FINDINGS: The heart size and mediastinal contours are within normal limits.
Both lungs are clear. The visualized skeletal structures are
unremarkable.
IMPRESSION: No active cardiopulmonary disease.

## 2016-08-20 ENCOUNTER — Emergency Department (HOSPITAL_COMMUNITY)
Admission: EM | Admit: 2016-08-20 | Discharge: 2016-08-20 | Disposition: A | Discharge status: Home / Self Care | Payer: Self-pay | Attending: Emergency Medicine | Admitting: Emergency Medicine

## 2016-08-20 ENCOUNTER — Encounter (HOSPITAL_COMMUNITY): Payer: Self-pay | Admitting: Emergency Medicine

## 2016-08-20 ENCOUNTER — Emergency Department (HOSPITAL_COMMUNITY): Payer: Self-pay

## 2016-08-20 DIAGNOSIS — F419 Anxiety disorder, unspecified: Secondary | ICD-10-CM | POA: Insufficient documentation

## 2016-08-20 LAB — I-STAT CHEM 8, ED
BUN: 10 mg/dL (ref 6–20)
CREATININE: 0.7 mg/dL (ref 0.44–1.00)
Calcium, Ion: 1.16 mmol/L (ref 1.15–1.40)
Chloride: 104 mmol/L (ref 101–111)
Glucose, Bld: 84 mg/dL (ref 65–99)
HEMATOCRIT: 39 % (ref 36.0–46.0)
HEMOGLOBIN: 13.3 g/dL (ref 12.0–15.0)
POTASSIUM: 4.3 mmol/L (ref 3.5–5.1)
Sodium: 139 mmol/L (ref 135–145)
TCO2: 29 mmol/L (ref 0–100)

## 2016-08-20 LAB — I-STAT BETA HCG BLOOD, ED (MC, WL, AP ONLY): I-stat hCG, quantitative: <5 m[IU]/mL (ref <5)

## 2016-08-20 LAB — I-STAT TROPONIN, ED: TROPONIN I, POC: 0 ng/mL (ref 0.00–0.08)

## 2016-08-20 MED ORDER — HYDROXYZINE HCL 25 MG PO TABS: 25.0000 mg | ORAL_TABLET | Freq: Four times a day (QID) | ORAL | 0 refills | Status: DC | PRN

## 2016-08-20 MED ORDER — HYDROXYZINE HCL 25 MG PO TABS
50.0000 mg | ORAL_TABLET | Freq: Once | ORAL | Status: AC
  Administered 2016-08-20: 50 mg via ORAL
  Filled 2016-08-20: qty 2

## 2016-08-20 NOTE — ED Notes (Signed)
Patient transported to X-ray 

## 2016-08-20 NOTE — Discharge Instructions (Signed)
Please follow with your primary care doctor in the next 2 days for a check-up. They must obtain records for further management.  ° °Do not hesitate to return to the Emergency Department for any new, worsening or concerning symptoms.  ° °

## 2016-08-20 NOTE — ED Provider Notes (Signed)
[facility]-EMERGENCY DEPT Provider Note   CSN: [id number] Arrival date & time: 08/20/16  1106     History   Chief Complaint Chief Complaint  Patient presents with  . Chest Pain    HPI   Blood pressure (!) 111/59, pulse 70, temperature 98.1 F (36.7 C), temperature source Oral, resp. rate 15, height 5\' 7"  (1.702 m), weight 73.5 kg (162 lb), SpO2 100 %.  Helen Curtis is a 26 y.o. female complaining of of diffuse anterior chest tightness that started yesterday morning upon awakening. She describes the pain as a constant pressure in the middle and right side of her chest. She also felt lightheaded and dizzy yesterday morning, and has slight SOB with long conversations and walking from car to the ER today. She started getting anxiety attacks this year and reports this feels similar to how her anxiety attacks start out. She currently does not seek outpatient treatment or therapy for anxiety but will start soon. Denies palpitations, diaphoresis, decreased appetite, N/V, abd pain, urinary changes. LMP 7/11, has a nexplanon implant, patient denies family history of early cardiac death.  Occasionally smokes marijuana, denies other illicit drug or cigarette use.  Past Medical History:  Diagnosis Date  . 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 History of chicken pox   . History of chlamydia   . No pertinent past medical history     Patient Active Problem List   Diagnosis Date Noted  . 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 Obesity 01/12/2011  . Normal vaginal delivery 01/12/2011    Past Surgical History:  Procedure Laterality Date  . NO PAST SURGERIES      OB History    Gravida Para Term Preterm AB Living   1 1 1  0 0 1   SAB TAB Ectopic Multiple Live Births   0 0 0 0 1       Home Medications    Prior to Admission medications   Medication Sig Start Date End Date Taking? Authorizing Provider  acetaminophen (TYLENOL) 325 MG tablet Take 650 mg by mouth every 6 (six)  hours as needed for headache.    Yes [provider]  cetirizine-pseudoephedrine (ZYRTEC-D) 5-120 MG tablet Take 1 tablet by mouth daily. Patient taking differently: Take 1 tablet by mouth daily as needed for allergies.  04/17/15  Yes Bethel Born, PA-C  etonogestrel (NEXPLANON) 68 MG IMPL implant 1 each by Subdermal route once.   Yes [provider]  diclofenac (VOLTAREN) 75 MG EC tablet Take 1 tablet (75 mg total) by mouth 2 (two) times daily. Patient not taking: Reported on 08/20/2016 09/09/15   Riki Sheer, PA-C  hydrOXYzine (ATARAX/VISTARIL) 25 MG tablet Take 1 tablet (25 mg total) by mouth every 6 (six) hours as needed for anxiety. 08/20/16   Brion Hedges, Joni Reining, PA-C    Family History Family History  Problem Relation Age of Onset  . Hypertension Mother   . Seizures Mother   . Hypertension Maternal Grandmother   . Cancer Paternal Grandmother   . Alzheimer's disease Paternal Grandmother   . Cancer Paternal Grandfather   . Anesthesia problems Neg Hx     Social History Social History  Substance Use Topics  . Smoking status: Never Smoker  . Smokeless tobacco: Never Used  . Alcohol use Yes     Allergies   Patient has no known allergies.   Review of Systems Review of Systems  A complete review of systems was  obtained and all systems are negative except as noted in the HPI and PMH.    Physical Exam Updated Vital Signs BP 105/80   Pulse 67   Temp 98.1 F (36.7 C) (Oral)   Resp 19   Ht 5\' 7"  (1.702 m)   Wt 73.5 kg (162 lb)   SpO2 100%   BMI 25.37 kg/m   Physical Exam  Constitutional: She is oriented to person, place, and time. She appears well-developed and well-nourished. No distress.  HENT:  Head: Normocephalic and atraumatic.  Mouth/Throat: Oropharynx is clear and moist.  Eyes: Pupils are equal, round, and reactive to light. Conjunctivae and EOM are normal.  Neck: Normal range of motion. No JVD present. No tracheal deviation present.    Cardiovascular: Normal rate, regular rhythm and intact distal pulses.   Radial pulse equal bilaterally  Pulmonary/Chest: Effort normal and breath sounds normal. No stridor. No respiratory distress. She has no wheezes. She has no rales. She exhibits no tenderness.  Abdominal: Soft. She exhibits no distension and no mass. There is no tenderness. There is no rebound and no guarding.  Musculoskeletal: Normal range of motion. She exhibits no edema or tenderness.  No calf asymmetry, superficial collaterals, palpable cords, edema, Homans sign negative bilaterally.    Neurological: She is alert and oriented to person, place, and time.  Skin: Skin is warm. She is not diaphoretic.  Psychiatric: She has a normal mood and affect.  Nursing note and vitals reviewed.    ED Treatments / Results  Labs (all labs ordered are listed, but only abnormal results are displayed) Labs Reviewed  I-STAT BETA HCG BLOOD, ED ([facility], [facility], [facility] ONLY)  I-STAT TROPONIN, ED  I-STAT CHEM 8, ED    EKG  EKG Interpretation  Date/Time:  Tuesday August 20 2016 11:11:24 EDT Ventricular Rate:  72 PR Interval:  164 QRS Duration: 80 QT Interval:  344 QTC Calculation: 376 R Axis:   97 Text Interpretation:  Normal sinus rhythm Rightward axis Nonspecific T wave abnormality Abnormal ECG sinus has replaced ecoptic atrial rhythm compared to last tracing Confirmed by Linwood DibblesKnapp, Jon [phone]) on 08/20/2016 11:35:25 AM Also confirmed by Linwood DibblesKnapp, Jon [phone]), editor Misty StanleyScales-Price, Shannon [phone])  on 08/20/2016 1:13:05 PM       Radiology Dg Chest 2 View  Result Date: 08/20/2016 CLINICAL DATA:  Shortness of breath and chest pain EXAM: CHEST  2 VIEW COMPARISON:  April 03, 2014 FINDINGS: Lungs are clear. Heart size and pulmonary vascularity are normal. No adenopathy. No pneumothorax. No bone lesions. IMPRESSION: No edema or consolidation. Electronically Signed   By: Bretta BangWilliam  Woodruff III M.D.   On: 08/20/2016 15:36    Procedures Procedures  (including critical care time)  Medications Ordered in ED Medications  hydrOXYzine (ATARAX/VISTARIL) tablet 50 mg (50 mg Oral Given 08/20/16 1404)     Initial Impression / Assessment and Plan / ED Course  I have reviewed the triage vital signs and the nursing notes.  Pertinent labs & imaging results that were available during my care of the patient were reviewed by me and considered in my medical decision making (see chart for details).     Vitals:   08/20/16 1315 08/20/16 1345 08/20/16 1430 08/20/16 1445  BP: 105/83 105/80    Pulse: 80 69 66 67  Resp:   20 19  Temp:      TempSrc:      SpO2: 100% 99% 99% 100%  Weight:      Height:  Medications  hydrOXYzine (ATARAX/VISTARIL) tablet 50 mg (50 mg Oral Given 08/20/16 1404)    Helen Curtis is 26 y.o. female presenting with Atypical chest pain, more related to her anxiety. Patient states that the tightness and associated with the feeling of severe stress., Patient is not tachycardic, no hemoptysis, physical exam is not consistent with DVT. I don't believe that this is a PE. Patient is low risk by heart score, EKG with no acute findings, troponin negative, chest x-ray negative. Patient given Vistaril for anxiety attack and resource guide to establish primary care.  Evaluation does not show pathology that would require ongoing emergent intervention or inpatient treatment. Pt is hemodynamically stable and mentating appropriately. Discussed findings and plan with patient/guardian, who agrees with care plan. All questions answered. Return precautions discussed and outpatient follow up given.      Final Clinical Impressions(s) / ED Diagnoses   Final diagnoses:  Anxiety    New Prescriptions New Prescriptions   HYDROXYZINE (ATARAX/VISTARIL) 25 MG TABLET    Take 1 tablet (25 mg total) by mouth every 6 (six) hours as needed for anxiety.     Kaylyn Lim 08/20/16 1553    Linwood Dibbles, MD 08/22/16 1416

## 2016-08-20 NOTE — ED Triage Notes (Signed)
Pt states she has a history of anxiety attacks and yesterday started to feel like she was going to have a anxiety attack and has been having episodes where she feels anxious and has tightness in her chest. Pt is in no acute distress, is breathing easy and warm and dry.

## 2017-05-19 ENCOUNTER — Emergency Department (HOSPITAL_COMMUNITY)
Admission: EM | Admit: 2017-05-19 | Discharge: 2017-05-20 | Disposition: A | Discharge status: Home / Self Care | Payer: Self-pay | Attending: Emergency Medicine | Admitting: Emergency Medicine

## 2017-05-19 ENCOUNTER — Other Ambulatory Visit: Payer: Self-pay

## 2017-05-19 DIAGNOSIS — J040 Acute laryngitis: Secondary | ICD-10-CM | POA: Insufficient documentation

## 2017-05-19 DIAGNOSIS — J029 Acute pharyngitis, unspecified: Secondary | ICD-10-CM | POA: Insufficient documentation

## 2017-05-19 NOTE — ED Provider Notes (Signed)
[facility] EMERGENCY DEPARTMENT Provider Note   CSN: [id number] Arrival date & time: 05/19/17  2017     History   Chief Complaint Chief Complaint  Patient presents with  . Sore Throat    HPI Helen Curtis is a 27 y.o. female with no significant past medical history, who presents to ED for evaluation of 2-day history of sore throat, loss of voice.  She is not taking any medications prior to arrival to help with symptoms.  No sick contacts with similar symptoms.  She reports intermittent cough and rhinorrhea.  Denies any drooling, trouble swallowing, fever, shortness of breath. Patient is concerned that this is a reaction to the Plan B pill that she took 7 days ago.  She states that "I do not know if it works."  Denies any vaginal bleeding, abdominal pain.  This was her first time taking the medications.  HPI  Past Medical History:  Diagnosis Date  . 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 History of chicken pox   . History of chlamydia   . No pertinent past medical history     Patient Active Problem List   Diagnosis Date Noted  . 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 Obesity 01/12/2011  . Normal vaginal delivery 01/12/2011    Past Surgical History:  Procedure Laterality Date  . NO PAST SURGERIES       OB History    Gravida  1   Para  1   Term  1   Preterm  0   AB  0   Living  1     SAB  0   TAB  0   Ectopic  0   Multiple  0   Live Births  1            Home Medications    Prior to Admission medications   Medication Sig Start Date End Date Taking? Authorizing Provider  acetaminophen (TYLENOL) 325 MG tablet Take 650 mg by mouth every 6 (six) hours as needed for headache.     [provider]  cetirizine-pseudoephedrine (ZYRTEC-D) 5-120 MG tablet Take 1 tablet by mouth daily. Patient taking differently: Take 1 tablet by mouth daily as needed for allergies.  04/17/15   Bethel Born, PA-C  diclofenac  (VOLTAREN) 75 MG EC tablet Take 1 tablet (75 mg total) by mouth 2 (two) times daily. Patient not taking: Reported on 08/20/2016 09/09/15   Riki Sheer, PA-C  etonogestrel (NEXPLANON) 68 MG IMPL implant 1 each by Subdermal route once.    [provider]  hydrOXYzine (ATARAX/VISTARIL) 25 MG tablet Take 1 tablet (25 mg total) by mouth every 6 (six) hours as needed for anxiety. 08/20/16   Pisciotta, Joni Reining, PA-C  lidocaine (XYLOCAINE) 2 % solution Use as directed 15 mLs in the mouth or throat as needed for mouth pain. 05/20/17   Kyrene Longan, PA-C  naproxen (NAPROSYN) 500 MG tablet Take 1 tablet (500 mg total) by mouth 2 (two) times daily. 05/20/17   Dietrich Pates, PA-C    Family History Family History  Problem Relation Age of Onset  . Hypertension Mother   . Seizures Mother   . Hypertension Maternal Grandmother   . Cancer Paternal Grandmother   . Alzheimer's disease Paternal Grandmother   . Cancer Paternal Grandfather   . Anesthesia problems Neg Hx     Social History Social History   Tobacco Use  . Smoking status:  Never Smoker  . Smokeless tobacco: Never Used  Substance Use Topics  . Alcohol use: Yes  . Drug use: No     Allergies   Patient has no known allergies.   Review of Systems Review of Systems  Constitutional: Negative for chills and fever.  HENT: Positive for rhinorrhea and voice change. Negative for drooling, ear discharge, ear pain, facial swelling, mouth sores, postnasal drip, sinus pressure, sinus pain, sneezing and trouble swallowing.   Respiratory: Positive for cough.   Gastrointestinal: Negative for abdominal pain.     Physical Exam Updated Vital Signs BP 123/65 (BP Location: Right Arm)   Pulse (!) 58   Temp 99.1 F (37.3 C)   Resp 16   Ht  (1.702 m)   Wt 81.6 kg (180 lb)   LMP 05/05/2017   SpO2 96%   BMI 28.19 kg/m   Physical Exam  Constitutional: She appears well-developed and well-nourished. No distress.  HENT:  Head:  Normocephalic and atraumatic.  Right Ear: Tympanic membrane normal.  Left Ear: Tympanic membrane normal.  Nose: Rhinorrhea present.  Mouth/Throat: Uvula is midline and oropharynx is clear and moist. Tonsils are 1+ on the right. Tonsils are 1+ on the left. No tonsillar exudate.  Bilaterally, symmetrically enlarged tonsils without exudates. Patient does not appear to be in acute distress. No trismus or drooling present. No pooling of secretions. Patient is tolerating secretions and is not in respiratory distress. No neck pain or tenderness to palpation of the neck. Full active and passive range of motion of the neck. No evidence of RPA or PTA.  Eyes: Conjunctivae and EOM are normal. No scleral icterus.  Neck: Normal range of motion.  Pulmonary/Chest: Effort normal. No respiratory distress.  Neurological: She is alert.  Skin: No rash noted. She is not diaphoretic.  Psychiatric: She has a normal mood and affect.  Nursing note and vitals reviewed.    ED Treatments / Results  Labs (all labs ordered are listed, but only abnormal results are displayed) Labs Reviewed  GROUP A STREP BY PCR  I-STAT BETA HCG BLOOD, ED ([facility], [facility], [facility] ONLY)    EKG None  Radiology No results found.  Procedures Procedures (including critical care time)  Medications Ordered in ED Medications  ketorolac (TORADOL) 30 MG/ML injection 30 mg (has no administration in time range)     Initial Impression / Assessment and Plan / ED Course  I have reviewed the triage vital signs and the nursing notes.  Pertinent labs & imaging results that were available during my care of the patient were reviewed by me and considered in my medical decision making (see chart for details).     Patient presents to ED for evaluation of sore throat, losing voice for the past 2 days.  On physical exam patient is overall well-appearing.  Her tonsils are bilaterally, symmetrically enlarged without exudates.  She is tolerating secretions  with no trismus, drooling, neck pain.  There is a slightly hoarse voice noted.  Strep test returned as negative.  Patient did have episode of vomiting after strep swab had to be re-collected.  However, she declined antiemetic.  Strep test returned as negative.  Pregnancy test was negative as well.  Suspect that her symptoms are due to viral pharyngitis and laryngitis.  Doubt RPA or PTA as a cause of her symptoms.  Advised to use anti-inflammatories, lidocaine to swish and spit and will give short course of antiemetics to help with nausea.  Advised to return for any  severe or worsening symptoms.   Portions of this note were generated with Scientist, clinical (histocompatibility and immunogenetics). Dictation errors may occur despite best attempts at proofreading.   Final Clinical Impressions(s) / ED Diagnoses   Final diagnoses:  Laryngitis  Viral pharyngitis    ED Discharge Orders        Ordered    naproxen (NAPROSYN) 500 MG tablet  2 times daily     05/20/17 0032    lidocaine (XYLOCAINE) 2 % solution  As needed     05/20/17 0032       Dietrich Pates, PA-C 05/20/17 0042    Palumbo, April, MD 05/20/17 1610

## 2017-05-19 NOTE — ED Triage Notes (Signed)
Patient c/o sore throat, loss of voice and is concerned that she may be having a reaction to the Plan B pill she took last Tuesday (7 days ago).

## 2017-05-20 LAB — I-STAT BETA HCG BLOOD, ED (MC, WL, AP ONLY): I-stat hCG, quantitative: <5 m[IU]/mL (ref <5)

## 2017-05-20 LAB — GROUP A STREP BY PCR: Group A Strep by PCR: NOT DETECTED

## 2017-05-20 MED ORDER — LIDOCAINE VISCOUS 2 % MT SOLN: 15.0000 mL | OROMUCOSAL | 0 refills | Status: DC | PRN

## 2017-05-20 MED ORDER — ONDANSETRON 4 MG PO TBDP: 4.0000 mg | ORAL_TABLET | Freq: Three times a day (TID) | ORAL | 0 refills | Status: DC | PRN

## 2017-05-20 MED ORDER — KETOROLAC TROMETHAMINE 30 MG/ML IJ SOLN
30.0000 mg | Freq: Once | INTRAMUSCULAR | Status: AC
  Administered 2017-05-20: 30 mg via INTRAMUSCULAR
  Filled 2017-05-20: qty 1

## 2017-05-20 MED ORDER — NAPROXEN 500 MG PO TABS: 500.0000 mg | ORAL_TABLET | Freq: Two times a day (BID) | ORAL | 0 refills | Status: DC

## 2017-05-20 NOTE — ED Notes (Signed)
Pt  Is a fast track pt, see PA's assessment.  PA requested that RN re-collect strep, RN did however pt began to vomit.  Pt declined anti-nausea medication.

## 2017-05-20 NOTE — Discharge Instructions (Addendum)
Your strep test today was negative.  Your pregnancy test was negative. Take the medications prescribed to you as directed. Follow-up with your primary care provider for further evaluation. Return to ED for any worsening symptoms, trouble breathing or trouble swallowing, neck pain, trouble opening your mouth, lightheadedness, loss of consciousness.

## 2017-11-12 ENCOUNTER — Encounter (HOSPITAL_COMMUNITY): Payer: Self-pay

## 2017-11-12 ENCOUNTER — Inpatient Hospital Stay (HOSPITAL_COMMUNITY)
Admission: AD | Admit: 2017-11-12 | Discharge: 2017-11-13 | Disposition: A | Discharge status: Home / Self Care | Payer: Medicaid Other | Source: Ambulatory Visit | Attending: Obstetrics and Gynecology | Admitting: Obstetrics and Gynecology

## 2017-11-12 DIAGNOSIS — K920 Hematemesis: Secondary | ICD-10-CM | POA: Insufficient documentation

## 2017-11-12 DIAGNOSIS — Z3A10 10 weeks gestation of pregnancy: Secondary | ICD-10-CM | POA: Diagnosis not present

## 2017-11-12 DIAGNOSIS — Z79899 Other long term (current) drug therapy: Secondary | ICD-10-CM | POA: Diagnosis not present

## 2017-11-12 DIAGNOSIS — O99611 Diseases of the digestive system complicating pregnancy, first trimester: Secondary | ICD-10-CM | POA: Diagnosis not present

## 2017-11-12 DIAGNOSIS — E860 Dehydration: Secondary | ICD-10-CM

## 2017-11-12 DIAGNOSIS — O219 Vomiting of pregnancy, unspecified: Secondary | ICD-10-CM | POA: Diagnosis not present

## 2017-11-12 LAB — URINALYSIS, ROUTINE W REFLEX MICROSCOPIC
Bilirubin Urine: NEGATIVE
Glucose, UA: NEGATIVE mg/dL
Hgb urine dipstick: NEGATIVE
Ketones, ur: NEGATIVE mg/dL
Leukocytes, UA: NEGATIVE
Nitrite: NEGATIVE
Protein, ur: NEGATIVE mg/dL
Specific Gravity, Urine: 1.021 (ref 1.005–1.030)
pH: 7 (ref 5.0–8.0)

## 2017-11-12 LAB — POCT PREGNANCY, URINE: Preg Test, Ur: POSITIVE — AB

## 2017-11-12 LAB — BASIC METABOLIC PANEL
Anion gap: 6 (ref 5–15)
BUN: 10 mg/dL (ref 6–20)
CO2: 25 mmol/L (ref 22–32)
CREATININE: 0.59 mg/dL (ref 0.44–1.00)
Calcium: 8.9 mg/dL (ref 8.9–10.3)
Chloride: 104 mmol/L (ref 98–111)
GFR calc Af Amer: >60 mL/min (ref >60)
GFR calc non Af Amer: >60 mL/min (ref >60)
Glucose, Bld: 86 mg/dL (ref 70–99)
Potassium: 3.8 mmol/L (ref 3.5–5.1)
SODIUM: 135 mmol/L (ref 135–145)

## 2017-11-12 MED ORDER — SODIUM CHLORIDE 0.9 % IV SOLN
INTRAVENOUS | Status: DC
  Administered 2017-11-12: 21:00:00 via INTRAVENOUS

## 2017-11-12 MED ORDER — FAMOTIDINE IN NACL 20-0.9 MG/50ML-% IV SOLN
20.0000 mg | Freq: Once | INTRAVENOUS | Status: AC
  Administered 2017-11-12: 20 mg via INTRAVENOUS
  Filled 2017-11-12: qty 50

## 2017-11-12 MED ORDER — PANTOPRAZOLE SODIUM 20 MG PO TBEC: 20.0000 mg | DELAYED_RELEASE_TABLET | Freq: Every day | ORAL | 0 refills | Status: DC

## 2017-11-12 MED ORDER — PROMETHAZINE HCL 25 MG/ML IJ SOLN
12.5000 mg | Freq: Four times a day (QID) | INTRAMUSCULAR | Status: DC | PRN
Start: 2017-11-12 — End: 2017-11-13
  Administered 2017-11-12: 12.5 mg via INTRAVENOUS
  Filled 2017-11-12: qty 1

## 2017-11-12 MED ORDER — DOXYLAMINE-PYRIDOXINE 10-10 MG PO TBEC: 2.0000 | DELAYED_RELEASE_TABLET | Freq: Every evening | ORAL | 5 refills | Status: DC | PRN

## 2017-11-12 NOTE — Discharge Instructions (Signed)
Morning Sickness Morning sickness is when you feel sick to your stomach (nauseous) during pregnancy. This nauseous feeling may or may not come with vomiting. It often occurs in the morning but can be a problem any time of day. Morning sickness is most common during the first trimester, but it may continue throughout pregnancy. While morning sickness is unpleasant, it is usually harmless unless you develop severe and continual vomiting (hyperemesis gravidarum). This condition requires more intense treatment. What are the causes? The cause of morning sickness is not completely known but seems to be related to normal hormonal changes that occur in pregnancy. What increases the risk? You are at greater risk if you:  Experienced nausea or vomiting before your pregnancy.  Had morning sickness during a previous pregnancy.  Are pregnant with more than one baby, such as twins.  How is this treated? Do not use any medicines (prescription, over-the-counter, or herbal) for morning sickness without first talking to your health care provider. Your health care provider may prescribe or recommend:  Vitamin B6 supplements.  Anti-nausea medicines.  The herbal medicine ginger.  Follow these instructions at home:  Only take over-the-counter or prescription medicines as directed by your health care provider.  Taking multivitamins before getting pregnant can prevent or decrease the severity of morning sickness in most women.  Eat a piece of dry toast or unsalted crackers before getting out of bed in the morning.  Eat five or six small meals a day.  Eat dry and bland foods (rice, baked potato). Foods high in carbohydrates are often helpful.  Do not drink liquids with your meals. Drink liquids between meals.  Avoid greasy, fatty, and spicy foods.  Get someone to cook for you if the smell of any food causes nausea and vomiting.  If you feel nauseous after taking prenatal vitamins, take the vitamins at  night or with a snack.  Snack on protein foods (nuts, yogurt, cheese) between meals if you are hungry.  Eat unsweetened gelatins for desserts.  Wearing an acupressure wristband (worn for sea sickness) may be helpful.  Acupuncture may be helpful.  Do not smoke.  Get a humidifier to keep the air in your house free of odors.  Get plenty of fresh air. Contact a health care provider if:  Your home remedies are not working, and you need medicine.  You feel dizzy or lightheaded.  You are losing weight. Get help right away if:  You have persistent and uncontrolled nausea and vomiting.  You pass out (faint). This information is not intended to replace advice given to you by your health care provider. Make sure you discuss any questions you have with your health care provider. Document Released: 02/21/2006 Document Revised: 06/08/2015 Document Reviewed: 06/17/2012 Elsevier Interactive Patient Education  2017 Elsevier Inc.  DICLEGIS  For Nausea  Take 2 tablets at bedtime. If symptoms persist, add 1 tab in the AM starting on day 3. If symptoms persist, add 1 tab in the PM starting day 4.    Hematemesis Hematemesis is when you vomit blood. It is a sign of bleeding in the upper part of your digestive tract. This is also called your gastrointestinal (GI) tract. Your upper GI tract includes your mouth, throat, esophagus, stomach, and the first part of your small intestine (duodenum). Hematemesis is usually caused by bleeding from your esophagus or stomach. You may suddenly vomit bright red blood. You might also vomit old blood. It may look like coffee grounds. You may also have other  symptoms, such as:  Stomach pain.  Heartburn.  Black and tarry stool.  Follow these instructions at home: Watch your hematemesis for any changes. The following actions may help to lessen any discomfort you are feeling:  Take medicines only as directed by your health care provider. Do not take aspirin,  ibuprofen, or any other anti-inflammatory medicine without approval from your health care provider.  Rest as needed.  Drink small sips of clear liquids often, as long as you can keep them down. Try to drink enough fluids to keep your urine clear or pale yellow.  Do not drink alcohol.  Do not use any tobacco products, including cigarettes, chewing tobacco, or electronic cigarettes. If you need help quitting, ask your health care provider.  Keep all follow-up visits as directed by your health care provider. This is important.  Contact a health care provider if:  The vomiting of blood worsens, or begins again after it has stopped.  You have persistent stomach pain.  You have nausea, indigestion, or heartburn.  You feel weak or dizzy. Get help right away if:  You faint or feel extremely weak.  You have a rapid heartbeat.  You are urinating less than normal or not at all.  You have persistent vomiting.  You vomit large amounts of bloody or dark material.  You vomit bright red blood.  You pass large, dark, or bloody stools.  You have chest pain or trouble breathing. This information is not intended to replace advice given to you by your health care provider. Make sure you discuss any questions you have with your health care provider. Document Released: 02/08/2004 Document Revised: 06/08/2015 Document Reviewed: 08/25/2013 Elsevier Interactive Patient Education  [employer].

## 2017-11-12 NOTE — MAU Provider Note (Signed)
Chief Complaint: No chief complaint on file.   First Provider Initiated Contact with Patient 11/12/17 2046        SUBJECTIVE HPI: Helen Curtis is a 27 y.o. G1P1001 at [redacted]w[redacted]d by LMP who presents to maternity admissions reporting nausea and vomiting.  Saw some blood in her most recent vomitus.  Did not take any antacid.  Has a history of GERD. She denies vaginal bleeding, vaginal itching/burning, urinary symptoms, h/a, dizziness, n/v, or fever/chills.    Emesis   This is a recurrent problem. The current episode started 1 to 4 weeks ago. The problem occurs 2 to 4 times per day. The problem has been unchanged. There has been no fever. Pertinent negatives include no abdominal pain, chest pain, chills, diarrhea, dizziness, fever, headaches or myalgias. She has tried nothing for the symptoms.   RN Note: Has had ongoing N/V.  Today saw some blood in her vomit.  Vomited x2 today.  Not on any meds.  No VB/discharge.  Hasn't started prenatal care yet.    Past Medical History:  Diagnosis Date  . 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 History of chicken pox   . History of chlamydia   . No pertinent past medical history    Past Surgical History:  Procedure Laterality Date  . NO PAST SURGERIES     Social History   Socioeconomic History  . Marital status: Single    Spouse name: Not on file  . Number of children: Not on file  . Years of education: Not on file  . Highest education level: Not on file  Occupational History  . Not on file  Social Needs  . Financial resource strain: Not on file  . Food insecurity:    Worry: Not on file    Inability: Not on file  . Transportation needs:    Medical: Not on file    Non-medical: Not on file  Tobacco Use  . Smoking status: Never Smoker  . Smokeless tobacco: Never Used  Substance and Sexual Activity  . Alcohol use: Yes  . Drug use: No  . Sexual activity: Yes    Birth control/protection: None  Lifestyle  .  Physical activity:    Days per week: Not on file    Minutes per session: Not on file  . Stress: Not on file  Relationships  . Social connections:    Talks on phone: Not on file    Gets together: Not on file    Attends religious service: Not on file    Active member of club or organization: Not on file    Attends meetings of clubs or organizations: Not on file    Relationship status: Not on file  . Intimate partner violence:    Fear of current or ex partner: Not on file    Emotionally abused: Not on file    Physically abused: Not on file    Forced sexual activity: Not on file  Other Topics Concern  . Not on file  Social History Narrative  . Not on file   No current facility-administered medications on file prior to encounter.    Current Outpatient Medications on File Prior to Encounter  Medication Sig Dispense Refill  . acetaminophen (TYLENOL) 325 MG tablet Take 650 mg by mouth every 6 (six) hours as needed for headache.     . cetirizine-pseudoephedrine (ZYRTEC-D) 5-120 MG tablet Take 1 tablet by mouth daily. (Patient taking differently: Take  1 tablet by mouth daily as needed for allergies. ) 30 tablet 0  . diclofenac (VOLTAREN) 75 MG EC tablet Take 1 tablet (75 mg total) by mouth 2 (two) times daily. (Patient not taking: Reported on 08/20/2016) 30 tablet 1  . etonogestrel (NEXPLANON) 68 MG IMPL implant 1 each by Subdermal route once.    . hydrOXYzine (ATARAX/VISTARIL) 25 MG tablet Take 1 tablet (25 mg total) by mouth every 6 (six) hours as needed for anxiety. 28 tablet 0  . lidocaine (XYLOCAINE) 2 % solution Use as directed 15 mLs in the mouth or throat as needed for mouth pain. 100 mL 0  . naproxen (NAPROSYN) 500 MG tablet Take 1 tablet (500 mg total) by mouth 2 (two) times daily. 30 tablet 0  . ondansetron (ZOFRAN ODT) 4 MG disintegrating tablet Take 1 tablet (4 mg total) by mouth every 8 (eight) hours as needed for nausea or vomiting. 3 tablet 0   No Known Allergies  I have  reviewed patient's Past Medical Hx, Surgical Hx, Family Hx, Social Hx, medications and allergies.   ROS:  Review of Systems  Constitutional: Negative for chills and fever.  Cardiovascular: Negative for chest pain.  Gastrointestinal: Positive for vomiting. Negative for abdominal pain and diarrhea.  Musculoskeletal: Negative for myalgias.  Neurological: Negative for dizziness and headaches.   Review of Systems  Other systems negative   Physical Exam  Physical Exam Patient Vitals for the past 24 hrs:  Height Weight  11/12/17 2033 5\' 7"  (1.702 m) 89 kg   Constitutional: Well-developed female in no acute distress.  Cardiovascular: normal rate Respiratory: normal effort GI: Abd soft, non-tender. Pos BS x 4 MS: Extremities nontender, no edema, normal ROM Neurologic: Alert and oriented x 4.  GU: Neg CVAT.  PELVIC EXAM: deferred   LAB RESULTS Results for orders placed or performed during the hospital encounter of 11/12/17 (from the past 24 hour(s))  Urinalysis, Routine w reflex microscopic     Status: None   Collection Time: 11/12/17  8:57 PM  Result Value Ref Range   Color, Urine YELLOW YELLOW   APPearance CLEAR CLEAR   Specific Gravity, Urine 1.021 1.005 - 1.030   pH 7.0 5.0 - 8.0   Glucose, UA NEGATIVE NEGATIVE mg/dL   Hgb urine dipstick NEGATIVE NEGATIVE   Bilirubin Urine NEGATIVE NEGATIVE   Ketones, ur NEGATIVE NEGATIVE mg/dL   Protein, ur NEGATIVE NEGATIVE mg/dL   Nitrite NEGATIVE NEGATIVE   Leukocytes, UA NEGATIVE NEGATIVE  Pregnancy, urine POC     Status: Abnormal   Collection Time: 11/12/17  9:01 PM  Result Value Ref Range   Preg Test, Ur POSITIVE (A) NEGATIVE  Basic metabolic panel     Status: None   Collection Time: 11/12/17  9:20 PM  Result Value Ref Range   Sodium 135 135 - 145 mmol/L   Potassium 3.8 3.5 - 5.1 mmol/L   Chloride 104 98 - 111 mmol/L   CO2 25 22 - 32 mmol/L   Glucose, Bld 86 70 - 99 mg/dL   BUN 10 6 - 20 mg/dL   Creatinine, Ser 1.47 0.44 -  1.00 mg/dL   Calcium 8.9 8.9 - 82.9 mg/dL   GFR calc non Af Amer >60 >60 mL/min   GFR calc Af Amer >60 >60 mL/min   Anion gap 6 5 - 15      IMAGING No results found.  MAU Management/MDM: Ordered BMET to check potassium and UA which was moderately concentrated Ordered IV hydration with  Phenergan for nausea and Pepcid for acid reduction. Felt better after IV infused.  Will order Diclegis and Protonix for home use  ASSESSMENT Single intrauterine pregnancy at [redacted]w[redacted]d Nausea and vomiting of pregnancy Acid reflux with hematemesis, likely from esophageal irritation  PLAN Discharge home Rx Protonix for GERD Rx Diclegis for nausea  Encouraged to seek prenatal care, list given  Pt stable at time of discharge. Encouraged to return here or to other Urgent Care/ED if she develops worsening of symptoms, increase in pain, fever, or other concerning symptoms.    Wynelle Bourgeois CNM, MSN Certified Nurse-Midwife 11/12/2017  8:49 PM

## 2017-11-12 NOTE — MAU Note (Signed)
Has had ongoing N/V.  Today saw some blood in her vomit.  Vomited x2 today.  Not on any meds.  No VB/discharge.  Hasn't started prenatal care yet.

## 2017-11-25 ENCOUNTER — Other Ambulatory Visit (HOSPITAL_COMMUNITY)
Admission: RE | Admit: 2017-11-25 | Discharge: 2017-11-25 | Disposition: A | Discharge status: Home / Self Care | Payer: Medicaid Other | Source: Ambulatory Visit | Attending: Obstetrics | Admitting: Obstetrics

## 2017-11-25 ENCOUNTER — Encounter: Payer: Self-pay | Admitting: Obstetrics

## 2017-11-25 ENCOUNTER — Ambulatory Visit (INDEPENDENT_AMBULATORY_CARE_PROVIDER_SITE_OTHER): Payer: Medicaid Other | Admitting: Obstetrics

## 2017-11-25 VITALS — BP 129/72 | HR 94 | Wt 203.0 lb

## 2017-11-25 DIAGNOSIS — Z3490 Encounter for supervision of normal pregnancy, unspecified, unspecified trimester: Secondary | ICD-10-CM | POA: Insufficient documentation

## 2017-11-25 DIAGNOSIS — Z3492 Encounter for supervision of normal pregnancy, unspecified, second trimester: Secondary | ICD-10-CM

## 2017-11-25 DIAGNOSIS — Z3481 Encounter for supervision of other normal pregnancy, first trimester: Secondary | ICD-10-CM

## 2017-11-25 MED ORDER — VITAFOL-NANO 18-0.6-0.4 MG PO TABS: 1.0000 | ORAL_TABLET | Freq: Every day | ORAL | 4 refills | Status: DC

## 2017-11-25 NOTE — Progress Notes (Signed)
Subjective:    Helen Curtis is being seen today for her first obstetrical visit.  This is not a planned pregnancy. She is at 1577w4d gestation. Her obstetrical history is significant for none. Relationship with FOB: significant other, living together. Patient does intend to breast feed. Pregnancy history fully reviewed.  The information documented in the HPI was reviewed and verified.  Menstrual History: OB History    Gravida  2   Para  1   Term  1   Preterm  0   AB  0   Living  1     SAB  0   TAB  0   Ectopic  0   Multiple  0   Live Births  1            Patient's last menstrual period was 08/29/2017.    Past Medical History:  Diagnosis Date  . 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 History of chicken pox   . History of chlamydia   . No pertinent past medical history     Past Surgical History:  Procedure Laterality Date  . NO PAST SURGERIES       (Not in a hospital admission) No Known Allergies  Social History   Tobacco Use  . Smoking status: Never Smoker  . Smokeless tobacco: Never Used  Substance Use Topics  . Alcohol use: Yes    Family History  Problem Relation Age of Onset  . Hypertension Mother   . Seizures Mother   . Hypertension Maternal Grandmother   . Cancer Paternal Grandmother   . Alzheimer's disease Paternal Grandmother   . Cancer Paternal Grandfather   . Anesthesia problems Neg Hx      Review of Systems Constitutional: negative for weight loss Gastrointestinal: negative for vomiting Genitourinary:negative for genital lesions and vaginal discharge and dysuria Musculoskeletal:negative for back pain Behavioral/Psych: negative for abusive relationship, depression, illegal drug usage and tobacco use    Objective:    BP 129/72   Pulse 94   Wt 203 lb (92.1 kg)   LMP 08/29/2017   BMI 31.79 kg/m  General Appearance:    Alert, cooperative, no distress, appears stated age  Head:    Normocephalic,  without obvious abnormality, atraumatic  Eyes:    PERRL, conjunctiva/corneas clear, EOM's intact, fundi    benign, both eyes  Ears:    Normal TM's and external ear canals, both ears  Nose:   Nares normal, septum midline, mucosa normal, no drainage    or sinus tenderness  Throat:   Lips, mucosa, and tongue normal; teeth and gums normal  Neck:   Supple, symmetrical, trachea midline, no adenopathy;    thyroid:  no enlargement/tenderness/nodules; no carotid   bruit or JVD  Back:     Symmetric, no curvature, ROM normal, no CVA tenderness  Lungs:     Clear to auscultation bilaterally, respirations unlabored  Chest Wall:    No tenderness or deformity   Heart:    Regular rate and rhythm, S1 and S2 normal, no murmur, rub   or gallop  Breast Exam:    No tenderness, masses, or nipple abnormality  Abdomen:     Soft, non-tender, bowel sounds active all four quadrants,    no masses, no organomegaly  Genitalia:    Normal female without lesion, discharge or tenderness  Extremities:   Extremities normal, atraumatic, no cyanosis or edema  Pulses:   2+ and symmetric all extremities  Skin:   Skin color, texture, turgor normal, no rashes or lesions  Lymph nodes:   Cervical, supraclavicular, and axillary nodes normal  Neurologic:   CNII-XII intact, normal strength, sensation and reflexes    throughout      Lab Review: Urine pregnancy test Labs reviewed yes Radiologic studies reviewed yes  Assessment:     1. Encounter for supervision of normal pregnancy, antepartum, unspecified gravidity Rx - Obstetric Panel, Including HIV - Culture, OB Urine - Cytology - PAP - Genetic Screening - Hemoglobinopathy evaluation - Cystic Fibrosis Mutation 97 - SMN1 COPY NUMBER ANALYSIS (SMA Carrier Screen) - Cervicovaginal ancillary only - Enroll Patient in Babyscripts - Prenatal-Fe Fum-Methf-FA w/o A (VITAFOL-NANO) 18-0.6-0.4 MG TABS; Take 1 tablet by mouth daily with breakfast.  Dispense: 90 tablet; Refill: 4     Plan:     1. Encounter for supervision of normal pregnancy, antepartum, unspecified gravidity Rx: - Obstetric Panel, Including HIV - Culture, OB Urine - Cytology - PAP - Genetic Screening - Hemoglobinopathy evaluation - Cystic Fibrosis Mutation 97 - SMN1 COPY NUMBER ANALYSIS (SMA Carrier Screen) - Cervicovaginal ancillary only - Enroll Patient in Babyscripts  Prenatal vitamins.  Counseling provided regarding continued use of seat belts, cessation of alcohol consumption, smoking or use of illicit drugs; infection precautions i.e., influenza/TDAP immunizations, toxoplasmosis,CMV, parvovirus, listeria and varicella; workplace safety, exercise during pregnancy; routine dental care, safe medications, sexual activity, hot tubs, saunas, pools, travel, caffeine use, fish and methlymercury, potential toxins, hair treatments, varicose veins Weight gain recommendations per IOM guidelines reviewed: underweight/BMI< 18.5--> gain 28 - 40 lbs; normal weight/BMI 18.5 - 24.9--> gain 25 - 35 lbs; overweight/BMI 25 - 29.9--> gain 15 - 25 lbs; obese/BMI >30->gain  11 - 20 lbs Problem list reviewed and updated. FIRST/CF mutation testing/NIPT/QUAD SCREEN/fragile X/Ashkenazi Jewish population testing/Spinal muscular atrophy discussed: requested. Role of ultrasound in pregnancy discussed; fetal survey: requested. Amniocentesis discussed: not indicated.  No orders of the defined types were placed in this encounter.  Orders Placed This Encounter  Procedures  . Culture, OB Urine  . Obstetric Panel, Including HIV  . Genetic Screening    PANORAMA  . Hemoglobinopathy evaluation  . Cystic Fibrosis Mutation 97  . SMN1 COPY NUMBER ANALYSIS (SMA Carrier Screen)    Follow up in 4 weeks. 50% of 20 min visit spent on counseling and coordination of care.     Brock Bad MD 11-25-2017

## 2017-11-25 NOTE — Progress Notes (Signed)
NOB  Pregnancy was not planned , pt has good support from FOB and family. Pt wants genetic screening..Marland Kitchen

## 2017-11-26 LAB — CERVICOVAGINAL ANCILLARY ONLY
CHLAMYDIA, DNA PROBE: NEGATIVE
NEISSERIA GONORRHEA: NEGATIVE
TRICH (WINDOWPATH): NEGATIVE

## 2017-11-26 LAB — CYTOLOGY - PAP: Diagnosis: NEGATIVE

## 2017-11-27 LAB — URINE CULTURE, OB REFLEX

## 2017-11-27 LAB — CULTURE, OB URINE

## 2017-11-28 LAB — OBSTETRIC PANEL, INCLUDING HIV
ANTIBODY SCREEN: NEGATIVE
Basophils Absolute: 0 10*3/uL (ref 0.0–0.2)
Basos: 0 %
EOS (ABSOLUTE): 0.1 10*3/uL (ref 0.0–0.4)
EOS: 2 %
HEMOGLOBIN: 11.2 g/dL (ref 11.1–15.9)
HIV Screen 4th Generation wRfx: NONREACTIVE
Hematocrit: 33.5 % — ABNORMAL LOW (ref 34.0–46.6)
Hepatitis B Surface Ag: NEGATIVE
Immature Grans (Abs): 0 10*3/uL (ref 0.0–0.1)
Immature Granulocytes: 0 %
LYMPHS: 24 %
Lymphocytes Absolute: 1.9 10*3/uL (ref 0.7–3.1)
MCH: 28.4 pg (ref 26.6–33.0)
MCHC: 33.4 g/dL (ref 31.5–35.7)
MCV: 85 fL (ref 79–97)
MONOCYTES: 7 %
MONOS ABS: 0.5 10*3/uL (ref 0.1–0.9)
NEUTROS PCT: 67 %
Neutrophils Absolute: 5.2 10*3/uL (ref 1.4–7.0)
Platelets: 219 10*3/uL (ref 150–450)
RBC: 3.94 x10E6/uL (ref 3.77–5.28)
RDW: 12.9 % (ref 12.3–15.4)
RH TYPE: POSITIVE
RPR Ser Ql: NONREACTIVE
Rubella Antibodies, IGG: 1.34 index (ref >0.99)
WBC: 7.7 10*3/uL (ref 3.4–10.8)

## 2017-11-28 LAB — HEMOGLOBINOPATHY EVALUATION
HGB C: 0 %
HGB S: 0 %
HGB VARIANT: 0 %
Hemoglobin A2 Quantitation: 2 % (ref 1.8–3.2)
Hemoglobin F Quantitation: 0 % (ref 0.0–2.0)
Hgb A: 98 % (ref 96.4–98.8)

## 2017-12-02 LAB — SMN1 COPY NUMBER ANALYSIS (SMA CARRIER SCREENING)

## 2017-12-02 LAB — CYSTIC FIBROSIS MUTATION 97: GENE DIS ANAL CARRIER INTERP BLD/T-IMP: NOT DETECTED

## 2017-12-13 ENCOUNTER — Encounter (HOSPITAL_COMMUNITY): Payer: Self-pay

## 2017-12-13 ENCOUNTER — Inpatient Hospital Stay (HOSPITAL_COMMUNITY)
Admission: AD | Admit: 2017-12-13 | Discharge: 2017-12-13 | Disposition: A | Discharge status: Home / Self Care | Payer: Medicaid Other | Source: Ambulatory Visit | Attending: Obstetrics and Gynecology | Admitting: Obstetrics and Gynecology

## 2017-12-13 DIAGNOSIS — R51 Headache: Secondary | ICD-10-CM | POA: Insufficient documentation

## 2017-12-13 DIAGNOSIS — R110 Nausea: Secondary | ICD-10-CM | POA: Insufficient documentation

## 2017-12-13 DIAGNOSIS — Z3A15 15 weeks gestation of pregnancy: Secondary | ICD-10-CM | POA: Insufficient documentation

## 2017-12-13 DIAGNOSIS — R519 Headache, unspecified: Secondary | ICD-10-CM

## 2017-12-13 DIAGNOSIS — O26892 Other specified pregnancy related conditions, second trimester: Secondary | ICD-10-CM | POA: Insufficient documentation

## 2017-12-13 DIAGNOSIS — Z79899 Other long term (current) drug therapy: Secondary | ICD-10-CM | POA: Insufficient documentation

## 2017-12-13 LAB — URINALYSIS, ROUTINE W REFLEX MICROSCOPIC
Bilirubin Urine: NEGATIVE
Glucose, UA: NEGATIVE mg/dL
Hgb urine dipstick: NEGATIVE
Ketones, ur: NEGATIVE mg/dL
Leukocytes, UA: NEGATIVE
Nitrite: NEGATIVE
PH: 6 (ref 5.0–8.0)
Protein, ur: NEGATIVE mg/dL
Specific Gravity, Urine: 1.019 (ref 1.005–1.030)

## 2017-12-13 MED ORDER — METOCLOPRAMIDE HCL 10 MG PO TABS: 10.0000 mg | ORAL_TABLET | Freq: Four times a day (QID) | ORAL | 0 refills | Status: DC

## 2017-12-13 MED ORDER — METOCLOPRAMIDE HCL 10 MG PO TABS
10.0000 mg | ORAL_TABLET | Freq: Once | ORAL | Status: AC
  Administered 2017-12-13: 10 mg via ORAL
  Filled 2017-12-13: qty 1

## 2017-12-13 MED ORDER — BUTALBITAL-APAP-CAFFEINE 50-325-40 MG PO TABS: 1.0000 | ORAL_TABLET | Freq: Four times a day (QID) | ORAL | 0 refills | Status: DC | PRN

## 2017-12-13 MED ORDER — MAGNESIUM OXIDE 400 (241.3 MG) MG PO TABS: 400.0000 mg | ORAL_TABLET | Freq: Every day | ORAL | 1 refills | Status: DC

## 2017-12-13 MED ORDER — BUTALBITAL-APAP-CAFFEINE 50-325-40 MG PO TABS
1.0000 | ORAL_TABLET | Freq: Once | ORAL | Status: AC
  Administered 2017-12-13: 1 via ORAL
  Filled 2017-12-13: qty 1

## 2017-12-13 MED ORDER — ACETAMINOPHEN 325 MG PO TABS
650.0000 mg | ORAL_TABLET | Freq: Once | ORAL | Status: AC
  Administered 2017-12-13: 650 mg via ORAL
  Filled 2017-12-13: qty 2

## 2017-12-13 NOTE — Discharge Instructions (Signed)
General Headache Without Cause A headache is pain or discomfort felt around the head or neck area. The specific cause of a headache may not be found. There are many causes and types of headaches. A few common ones are:  Tension headaches.  Migraine headaches.  Cluster headaches.  Chronic daily headaches.  Follow these instructions at home: Watch your condition for any changes. Take these steps to help with your condition: Managing pain  Take over-the-counter and prescription medicines only as told by your health care provider.  Lie down in a dark, quiet room when you have a headache.  If directed, apply ice to the head and neck area: ? Put ice in a plastic bag. ? Place a towel between your skin and the bag. ? Leave the ice on for 20 minutes, 2-3 times per day.  Use a heating pad or hot shower to apply heat to the head and neck area as told by your health care provider.  Keep lights dim if bright lights bother you or make your headaches worse. Eating and drinking  Eat meals on a regular schedule.  Limit alcohol use.  Decrease the amount of caffeine you drink, or stop drinking caffeine. General instructions  Keep all follow-up visits as told by your health care provider. This is important.  Keep a headache journal to help find out what may trigger your headaches. For example, write down: ? What you eat and drink. ? How much sleep you get. ? Any change to your diet or medicines.  Try massage or other relaxation techniques.  Limit stress.  Sit up straight, and do not tense your muscles.  Do not use tobacco products, including cigarettes, chewing tobacco, or e-cigarettes. If you need help quitting, ask your health care provider.  Exercise regularly as told by your health care provider.  Sleep on a regular schedule. Get 7-9 hours of sleep, or the amount recommended by your health care provider. Contact a health care provider if:  Your symptoms are not helped by  medicine.  You have a headache that is different from the usual headache.  You have nausea or you vomit.  You have a fever. Get help right away if:  Your headache becomes severe.  You have repeated vomiting.  You have a stiff neck.  You have a loss of vision.  You have problems with speech.  You have pain in the eye or ear.  You have muscular weakness or loss of muscle control.  You lose your balance or have trouble walking.  You feel faint or pass out.  You have confusion. This information is not intended to replace advice given to you by your health care provider. Make sure you discuss any questions you have with your health care provider. Document Released: 12/31/2004 Document Revised: 06/08/2015 Document Reviewed: 04/25/2014 Elsevier Interactive Patient Education  2018 [employer]  

## 2017-12-13 NOTE — MAU Note (Signed)
Helen Curtis is a 27 y.o. at 5519w1d here in MAU reporting: +headache. +sensitivity to light. Starts at base of neck and encircles head. Pressure behind eyes. +nausea Onset of complaint: started Thursday following a panic attack Pain score: 8/10. Reports that she has been taking tylenol for the past 3 days. Vitals:   12/13/17 1603  BP: (!) 115/57  Pulse: 90  Resp: 18  Temp: 98.3 F (36.8 C)  SpO2: 100%     Lab orders placed from triage: ua

## 2017-12-13 NOTE — MAU Provider Note (Signed)
History     CSN: [id number]  Arrival date and time: 12/13/17 1555   First Provider Initiated Contact with Patient 12/13/17 1646      Chief Complaint  Patient presents with  . Headache  . Nausea   Ms. Mahnke presents for HA States that HA started on Thursday and has continued these last 3 days Hurts at neck radiating up to occipital region and behind eyes radiating up to frontal region Also has bitemporal pain Pain moves with changes in head position Denies history of migraines but reports that HA gets worse with light Reports history of headaches during and outside of pregnancy, but states that they usually get better with extra strength tyenol and this headache is not getting better States that pain makes it difficult to sleep Also has N/V that has made it difficult to eat or drink regularly Denies any weakness or numbness Reports increased fatigue Denies fevers, sinus congestion, flu-like symptoms, urinary symptoms, vaginal discharge, pain, itching or burning, abdominal pain or diarrhea    Past Medical History:  Diagnosis Date  . 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 History of chicken pox   . History of chlamydia   . No pertinent past medical history     Past Surgical History:  Procedure Laterality Date  . NO PAST SURGERIES      Family History  Problem Relation Age of Onset  . Hypertension Mother   . Seizures Mother   . Hypertension Maternal Grandmother   . Cancer Paternal Grandmother   . Alzheimer's disease Paternal Grandmother   . Cancer Paternal Grandfather   . Anesthesia problems Neg Hx     Social History   Tobacco Use  . Smoking status: Never Smoker  . Smokeless tobacco: Never Used  Substance Use Topics  . Alcohol use: Not Currently  . Drug use: No    Allergies: No Known Allergies  Medications Prior to Admission  Medication Sig Dispense Refill Last Dose  . acetaminophen (TYLENOL) 325 MG tablet Take 650 mg by  mouth every 6 (six) hours as needed for headache.    Not Taking  . cetirizine-pseudoephedrine (ZYRTEC-D) 5-120 MG tablet Take 1 tablet by mouth daily. (Patient not taking: Reported on 11/25/2017) 30 tablet 0 Not Taking  . Doxylamine-Pyridoxine (DICLEGIS) 10-10 MG TBEC Take 2 tablets by mouth at bedtime as needed. 100 tablet 5 Taking  . lidocaine (XYLOCAINE) 2 % solution Use as directed 15 mLs in the mouth or throat as needed for mouth pain. (Patient not taking: Reported on 11/25/2017) 100 mL 0 Not Taking  . ondansetron (ZOFRAN ODT) 4 MG disintegrating tablet Take 1 tablet (4 mg total) by mouth every 8 (eight) hours as needed for nausea or vomiting. 3 tablet 0 Taking  . pantoprazole (PROTONIX) 20 MG tablet Take 1 tablet (20 mg total) by mouth daily. 30 tablet 0 Taking  . Prenatal-Fe Fum-Methf-FA w/o A (VITAFOL-NANO) 18-0.6-0.4 MG TABS Take 1 tablet by mouth daily with breakfast. 90 tablet 4     Review of Systems  All other systems reviewed and are negative.  Physical Exam   Blood pressure (!) 115/57, pulse 90, temperature 98.3 F (36.8 C), temperature source Oral, resp. rate 18, weight 94.8 kg, last menstrual period 08/29/2017, SpO2 100 %.  Physical Exam  Constitutional: She is oriented to person, place, and time. She appears well-developed and well-nourished.  HENT:  Head: Normocephalic and atraumatic.  Eyes: Right eye exhibits no discharge. Left  eye exhibits no discharge. No scleral icterus.  Cardiovascular: Normal rate and regular rhythm.  Respiratory: Effort normal. No respiratory distress.  Neurological: She is alert and oriented to person, place, and time.  Skin: Skin is warm and dry. No rash noted.  Psychiatric: She has a normal mood and affect. Her behavior is normal. Judgment and thought content normal.    MAU Course  Procedures  MDM HA with no red flags Treated with fioricet, tylenol and reglan Improved ~1 hour after treatment  Assessment and Plan  Nonintractable  headache, unspecified chronicity pattern, unspecified headache type - will discharge with fioricet, magnesium daily and reglan  - BP WNL - encourage hydration, HA diary and stress management - avoid triggers - return precautions discussed - follow up at next scheduled visit or PRN   Gwenevere Abbot  Ob Fellow  12/13/2017, 4:48 PM

## 2017-12-23 ENCOUNTER — Encounter: Payer: Self-pay | Admitting: Obstetrics

## 2017-12-23 ENCOUNTER — Ambulatory Visit (INDEPENDENT_AMBULATORY_CARE_PROVIDER_SITE_OTHER): Payer: Medicaid Other | Admitting: Obstetrics

## 2017-12-23 VITALS — BP 107/68 | HR 87 | Wt 209.3 lb

## 2017-12-23 DIAGNOSIS — Z3492 Encounter for supervision of normal pregnancy, unspecified, second trimester: Secondary | ICD-10-CM

## 2017-12-23 DIAGNOSIS — Z3490 Encounter for supervision of normal pregnancy, unspecified, unspecified trimester: Secondary | ICD-10-CM

## 2017-12-23 NOTE — Progress Notes (Signed)
Pt is here for ROB. G2P1 [redacted]w[redacted]d.  

## 2017-12-23 NOTE — Progress Notes (Signed)
Subjective:  Helen Curtis is a 27 y.o. G2P1001 at 1217w4d being seen today for ongoing prenatal care.  She is currently monitored for the following issues for this low-risk pregnancy and has GBS carrier; Obesity; Normal vaginal delivery; and Supervision of normal pregnancy on their problem list.  Patient reports no complaints.  Contractions: Not present. Vag. Bleeding: None.   . Denies leaking of fluid.   The following portions of the patient's history were reviewed and updated as appropriate: allergies, current medications, past family history, past medical history, past social history, past surgical history and problem list. Problem list updated.  Objective:   Vitals:   12/23/17 0923  BP: 107/68  Pulse: 87  Weight: 209 lb 4.8 oz (94.9 kg)    Fetal Status: Fetal Heart Rate (bpm): 150         General:  Alert, oriented and cooperative. Patient is in no acute distress.  Skin: Skin is warm and dry. No rash noted.   Cardiovascular: Normal heart rate noted  Respiratory: Normal respiratory effort, no problems with respiration noted  Abdomen: Soft, gravid, appropriate for gestational age. Pain/Pressure: Absent     Pelvic:  Cervical exam deferred        Extremities: Normal range of motion.  Edema: None  Mental Status: Normal mood and affect. Normal behavior. Normal judgment and thought content.   Urinalysis:      Assessment and Plan:  Pregnancy: G2P1001 at 4617w4d  1. Encounter for supervision of normal pregnancy, antepartum, unspecified gravidity Rx: - Genetic Screening - AFP, Serum, Open Spina Bifida - US MFM OB COMP + 14 WK; Future  Preterm labor symptoms and general obstetric precautions including but not limited to vaginal bleeding, contractions, leaking of fluid and fetal movement were reviewed in detail with the patient. Please refer to After Visit Summary for other counseling recommendations.  Return in about 4 weeks (around 01/20/2018) for ROB.   Brock BadHarper, Sopheap Basic A, MD

## 2017-12-25 LAB — AFP, SERUM, OPEN SPINA BIFIDA
AFP MoM: 1.26
AFP Value: 40.4 ng/mL
Gest. Age on Collection Date: 16.6 weeks
Maternal Age At EDD: 28.4 yr
OSBR Risk 1 IN: 10000
Test Results:: NEGATIVE
Weight: 209 [lb_av]

## 2017-12-31 ENCOUNTER — Encounter (HOSPITAL_COMMUNITY): Payer: Self-pay

## 2018-01-09 ENCOUNTER — Ambulatory Visit (HOSPITAL_COMMUNITY)
Admission: RE | Admit: 2018-01-09 | Discharge: 2018-01-09 | Disposition: A | Discharge status: Home / Self Care | Payer: Medicaid Other | Source: Ambulatory Visit | Attending: Obstetrics | Admitting: Obstetrics

## 2018-01-09 DIAGNOSIS — O99212 Obesity complicating pregnancy, second trimester: Secondary | ICD-10-CM | POA: Diagnosis not present

## 2018-01-09 DIAGNOSIS — Z3492 Encounter for supervision of normal pregnancy, unspecified, second trimester: Secondary | ICD-10-CM | POA: Diagnosis not present

## 2018-01-09 DIAGNOSIS — Z3A19 19 weeks gestation of pregnancy: Secondary | ICD-10-CM | POA: Diagnosis not present

## 2018-01-09 DIAGNOSIS — Z363 Encounter for antenatal screening for malformations: Secondary | ICD-10-CM | POA: Diagnosis not present

## 2018-01-09 DIAGNOSIS — Z3490 Encounter for supervision of normal pregnancy, unspecified, unspecified trimester: Secondary | ICD-10-CM

## 2018-01-14 NOTE — L&D Delivery Note (Signed)
OB/[facility]N Faculty Practice Delivery Note  Helen Curtis is a 28 y.o. [id number] s/p VAVD at [redacted]w[redacted]d. She was admitted for induction for post-dates.   ROM: 8h 51m with moderate meconium-stained fluid GBS Status: positive - adequate, > 2 doses Maximum Maternal Temperature: Temp (48hrs), Avg:98.6 F (37 C), Min:98.1 F (36.7 C), Max:99.8 F (37.7 C)  Labor Progress: . Induction started with FB and cytotec . Transitioned to pitocin . AROM moderate meconium . Epidural placement . Complete at 0450, inadequate pushing x 1 hour, labored down . Started pushing again for about 2 hours, good progress but maternal fatigue, caput, asynclitic presentation  Delivery Date/Time: 06/15/18 Delivery: Called to room and patient was complete and pushing. Good maternal effort and movement with pushing but mostly caput, fetal head ROP. Given prolonged 2nd stage, deep variables with pushing, +2 station, discussed option for vacuum to assist with delivery. Reviewed risks/benefits and patient in agreement. Dr. Debroah Loop into room to assist as well as NICU. Kiwi vacuum placed and aided in rotation and delivery of head. Head delivered LOA. No nuchal cord present, loose cord around shoulders. Shoulder and body delivered in usual fashion. Infant with spontaneous cry, placed on mother's abdomen, dried and stimulated. Cord clamped x 2 after 1-minute delay, and cut by patient's aunt. Cord blood drawn. Placenta delivered spontaneously with gentle cord traction. Fundus firm with massage and Pitocin. Labia, perineum, vagina, and cervix inspected inspected with 2nd degree laceration.   Placenta: spontaneous, intact, 3-vessel cord (sent to pathology) Complications: moderate meconium  deep variables with pushing  prolonged 2nd stage  NICU attendance given vacuum delivery Lacerations: 2nd degree repaired with 3-0 Vicryl EBL: 246cc per Triton Analgesia: epidural  Postpartum Planning [x]  message to sent to schedule follow-up  [x]   vaccines UTD  Infant: Vigorous female  APGARs 8, 15  4190g  Helen Broman S. Earlene Plater, DO OB/GYN Fellow, Faculty Practice

## 2018-01-20 ENCOUNTER — Ambulatory Visit (INDEPENDENT_AMBULATORY_CARE_PROVIDER_SITE_OTHER): Payer: Medicaid Other | Admitting: Obstetrics

## 2018-01-20 ENCOUNTER — Encounter: Payer: Self-pay | Admitting: Obstetrics

## 2018-01-20 DIAGNOSIS — Z3A20 20 weeks gestation of pregnancy: Secondary | ICD-10-CM

## 2018-01-20 DIAGNOSIS — Z3492 Encounter for supervision of normal pregnancy, unspecified, second trimester: Secondary | ICD-10-CM

## 2018-01-20 DIAGNOSIS — Z3490 Encounter for supervision of normal pregnancy, unspecified, unspecified trimester: Secondary | ICD-10-CM

## 2018-01-20 MED ORDER — VITAFOL-NANO 18-0.6-0.4 MG PO TABS: 1.0000 | ORAL_TABLET | Freq: Every day | ORAL | 4 refills | Status: DC

## 2018-01-20 NOTE — Progress Notes (Signed)
Subjective:  Helen Curtis is a 28 y.o. G2P1001 at [redacted]w[redacted]d being seen today for ongoing prenatal care.  She is currently monitored for the following issues for this low-risk pregnancy and has GBS carrier; Obesity; Normal vaginal delivery; and Supervision of normal pregnancy on their problem list.  Patient reports no complaints.  Contractions: Not present. Vag. Bleeding: None.  Movement: Present. Denies leaking of fluid.   The following portions of the patient's history were reviewed and updated as appropriate: allergies, current medications, past family history, past medical history, past social history, past surgical history and problem list. Problem list updated.  Objective:   Vitals:   01/20/18 0850  BP: 112/68  Pulse: 92  Weight: 214 lb 12.8 oz (97.4 kg)    Fetal Status:     Movement: Present     General:  Alert, oriented and cooperative. Patient is in no acute distress.  Skin: Skin is warm and dry. No rash noted.   Cardiovascular: Normal heart rate noted  Respiratory: Normal respiratory effort, no problems with respiration noted  Abdomen: Soft, gravid, appropriate for gestational age. Pain/Pressure: Absent     Pelvic:  Cervical exam deferred        Extremities: Normal range of motion.  Edema: None  Mental Status: Normal mood and affect. Normal behavior. Normal judgment and thought content.   Urinalysis:      Assessment and Plan:  Pregnancy: G2P1001 at 102w4d  1. Encounter for supervision of normal pregnancy, antepartum, unspecified gravidity Rx: - Prenatal-Fe Fum-Methf-FA w/o A (VITAFOL-NANO) 18-0.6-0.4 MG TABS; Take 1 tablet by mouth daily with breakfast.  Dispense: 90 tablet; Refill: 4  Preterm labor symptoms and general obstetric precautions including but not limited to vaginal bleeding, contractions, leaking of fluid and fetal movement were reviewed in detail with the patient. Please refer to After Visit Summary for other counseling recommendations.  Return in about 4  weeks (around 02/17/2018) for ROB.   Brock Bad, MD

## 2018-01-20 NOTE — Progress Notes (Signed)
Pt is here for ROB. [redacted]w[redacted]d.  

## 2018-02-06 ENCOUNTER — Inpatient Hospital Stay (HOSPITAL_COMMUNITY)
Admission: AD | Admit: 2018-02-06 | Discharge: 2018-02-06 | Disposition: A | Discharge status: Home / Self Care | Payer: Medicaid Other | Attending: Obstetrics and Gynecology | Admitting: Obstetrics and Gynecology

## 2018-02-06 ENCOUNTER — Encounter (HOSPITAL_COMMUNITY): Payer: Self-pay | Admitting: *Deleted

## 2018-02-06 DIAGNOSIS — Z3689 Encounter for other specified antenatal screening: Secondary | ICD-10-CM

## 2018-02-06 DIAGNOSIS — O26892 Other specified pregnancy related conditions, second trimester: Secondary | ICD-10-CM | POA: Diagnosis not present

## 2018-02-06 DIAGNOSIS — N949 Unspecified condition associated with female genital organs and menstrual cycle: Secondary | ICD-10-CM

## 2018-02-06 DIAGNOSIS — O368120 Decreased fetal movements, second trimester, not applicable or unspecified: Secondary | ICD-10-CM

## 2018-02-06 DIAGNOSIS — Z3A23 23 weeks gestation of pregnancy: Secondary | ICD-10-CM | POA: Diagnosis not present

## 2018-02-06 DIAGNOSIS — R102 Pelvic and perineal pain: Secondary | ICD-10-CM | POA: Insufficient documentation

## 2018-02-06 LAB — URINALYSIS, ROUTINE W REFLEX MICROSCOPIC
BILIRUBIN URINE: NEGATIVE
Glucose, UA: NEGATIVE mg/dL
Hgb urine dipstick: NEGATIVE
Ketones, ur: NEGATIVE mg/dL
Leukocytes, UA: NEGATIVE
NITRITE: NEGATIVE
Protein, ur: NEGATIVE mg/dL
Specific Gravity, Urine: 1.01 (ref 1.005–1.030)
pH: 7 (ref 5.0–8.0)

## 2018-02-06 NOTE — Discharge Instructions (Signed)

## 2018-02-06 NOTE — MAU Note (Signed)
Pt reports sharp pelvic pain yesterday, today the pain is only with ambulation. Has not felt the baby move since yesterday.

## 2018-02-06 NOTE — MAU Provider Note (Signed)
History     CSN: [id number]  Arrival date and time: 02/06/18 [id number]   First Provider Initiated Contact with Patient 02/06/18 [phone]      Chief Complaint  Patient presents with  . Pelvic Pain  . Decreased Fetal Movement   HPI Helen Curtis is a 28 y.o. G3P1011 at 6726w0d who presents to MAU with chief complaints of pelvic pain and decreased fetal movement. She endorses recurrent pain at her hip flexors when she walks or repositionings. Pain is relieved by sitting and lying down.   Patient states she has not felt her baby move since about 7pm last night. She contemplated coming in for evaluation at that time but was concerned she was "making a big deal out of nothing".   She denies vaginal bleeding, leaking of fluid, dysuria, fever, falls, or recent illness.    OB History    Gravida  3   Para  1   Term  1   Preterm  0   AB  1   Living  1     SAB  0   TAB  1   Ectopic  0   Multiple  0   Live Births  1           Past Medical History:  Diagnosis Date  . 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 History of chicken pox   . History of chlamydia   . No pertinent past medical history     Past Surgical History:  Procedure Laterality Date  . NO PAST SURGERIES      Family History  Problem Relation Age of Onset  . Hypertension Mother   . Seizures Mother   . Hypertension Maternal Grandmother   . Cancer Paternal Grandmother   . Alzheimer's disease Paternal Grandmother   . Cancer Paternal Grandfather   . Anesthesia problems Neg Hx     Social History   Tobacco Use  . Smoking status: Never Smoker  . Smokeless tobacco: Never Used  Substance Use Topics  . Alcohol use: Not Currently  . Drug use: No    Allergies: No Known Allergies  Medications Prior to Admission  Medication Sig Dispense Refill Last Dose  . acetaminophen (TYLENOL) 325 MG tablet Take 650 mg by mouth every 6 (six) hours as needed for headache.    Taking  .  butalbital-acetaminophen-caffeine (FIORICET, ESGIC) 50-325-40 MG tablet Take 1-2 tablets by mouth every 6 (six) hours as needed for headache. 20 tablet 0 Taking  . cetirizine-pseudoephedrine (ZYRTEC-D) 5-120 MG tablet Take 1 tablet by mouth daily. 30 tablet 0 Taking  . Doxylamine-Pyridoxine (DICLEGIS) 10-10 MG TBEC Take 2 tablets by mouth at bedtime as needed. (Patient not taking: Reported on 01/20/2018) 100 tablet 5 Not Taking  . magnesium oxide (MAG-OX) 400 (241.3 Mg) MG tablet Take 1 tablet (400 mg total) by mouth daily. (Patient not taking: Reported on 01/20/2018) 60 tablet 1 Not Taking  . metoCLOPramide (REGLAN) 10 MG tablet Take 1 tablet (10 mg total) by mouth every 6 (six) hours. 30 tablet 0 Taking  . ondansetron (ZOFRAN ODT) 4 MG disintegrating tablet Take 1 tablet (4 mg total) by mouth every 8 (eight) hours as needed for nausea or vomiting. (Patient not taking: Reported on 12/23/2017) 3 tablet 0 Not Taking  . pantoprazole (PROTONIX) 20 MG tablet Take 1 tablet (20 mg total) by mouth daily. 30 tablet 0 Taking  . Prenatal-Fe Fum-Methf-FA w/o A (VITAFOL-NANO) 18-0.6-0.4 MG TABS  Take 1 tablet by mouth daily with breakfast. 90 tablet 4     Review of Systems  Constitutional: Negative for chills, fatigue and fever.  Gastrointestinal: Negative for abdominal pain, diarrhea, nausea and vomiting.  Genitourinary: Positive for pelvic pain. Negative for flank pain, vaginal bleeding, vaginal discharge and vaginal pain.  Neurological: Negative for headaches.  All other systems reviewed and are negative.  Physical Exam   Blood pressure (!) 109/59, pulse 83, temperature 98.6 F (37 C), temperature source Oral, resp. rate 16, height 5\' 7"  (1.702 m), weight 100.2 kg, last menstrual period 08/29/2017, SpO2 100 %.  Physical Exam  Nursing note and vitals reviewed. Constitutional: She is oriented to person, place, and time. She appears well-developed and well-nourished.  Cardiovascular: Normal rate.   Respiratory: Effort normal. No respiratory distress.  GI: Soft. She exhibits no distension. There is no abdominal tenderness. There is no rebound and no guarding.  Gravid  Neurological: She is alert and oriented to person, place, and time.  Skin: Skin is warm and dry.  Psychiatric: She has a normal mood and affect. Her behavior is normal. Judgment and thought content normal.    MAU Course/MDM  Procedures  --Reactive tracing: baseline 145, moderate variability, positive 10 x 10 accels, variable decel appropriate for GA --Toco: quiet --Reviewed interventions for round ligament pain, reiterated in AVS  Patient Vitals for the past 24 hrs:  BP Temp Temp src Pulse Resp SpO2 Height Weight  02/06/18 1000 (!) 124/58 98.2 F (36.8 C) Oral 78 18 100 % - -  02/06/18 0834 (!) 109/59 98.6 F (37 C) Oral 83 16 100 % 5\' 7"  (1.702 m) 100.2 kg    Results for orders placed or performed during the hospital encounter of 02/06/18 (from the past 24 hour(s))  Urinalysis, Routine w reflex microscopic     Status: None   Collection Time: 02/06/18  8:56 AM  Result Value Ref Range   Color, Urine YELLOW YELLOW   APPearance CLEAR CLEAR   Specific Gravity, Urine 1.010 1.005 - 1.030   pH 7.0 5.0 - 8.0   Glucose, UA NEGATIVE NEGATIVE mg/dL   Hgb urine dipstick NEGATIVE NEGATIVE   Bilirubin Urine NEGATIVE NEGATIVE   Ketones, ur NEGATIVE NEGATIVE mg/dL   Protein, ur NEGATIVE NEGATIVE mg/dL   Nitrite NEGATIVE NEGATIVE   Leukocytes, UA NEGATIVE NEGATIVE     Assessment and Plan  --28 y.o. G3P1011 at [redacted]w[redacted]d  --Reactive fetal tracing --Round ligament pain --Discharge home in stable condition  F/U: [facility] 02/17/2018  Calvert Cantor 02/06/2018, 10:10 AM

## 2018-02-17 ENCOUNTER — Ambulatory Visit (INDEPENDENT_AMBULATORY_CARE_PROVIDER_SITE_OTHER): Payer: Medicaid Other | Admitting: Obstetrics

## 2018-02-17 ENCOUNTER — Other Ambulatory Visit: Payer: Self-pay

## 2018-02-17 ENCOUNTER — Encounter: Payer: Self-pay | Admitting: Obstetrics

## 2018-02-17 VITALS — BP 110/65 | HR 91 | Wt 221.9 lb

## 2018-02-17 DIAGNOSIS — M549 Dorsalgia, unspecified: Secondary | ICD-10-CM

## 2018-02-17 DIAGNOSIS — Z3482 Encounter for supervision of other normal pregnancy, second trimester: Secondary | ICD-10-CM

## 2018-02-17 DIAGNOSIS — K5901 Slow transit constipation: Secondary | ICD-10-CM

## 2018-02-17 MED ORDER — COMFORT FIT MATERNITY SUPP SM MISC: 0 refills | Status: DC

## 2018-02-17 MED ORDER — VITAFOL GUMMIES 3.33-0.333-34.8 MG PO CHEW: 3.0000 | CHEWABLE_TABLET | Freq: Every day | ORAL | 11 refills | Status: DC

## 2018-02-17 MED ORDER — DOCUSATE SODIUM 100 MG PO CAPS: 100.0000 mg | ORAL_CAPSULE | Freq: Two times a day (BID) | ORAL | 5 refills | Status: DC

## 2018-02-17 NOTE — Progress Notes (Signed)
Subjective:  Helen Curtis is a 28 y.o. G3P1011 at 6582w4d being seen today for ongoing prenatal care.  She is currently monitored for the following issues for this low-risk pregnancy and has Obesity and Supervision of normal pregnancy on their problem list.  Patient reports backache and pelvic pressure.  Contractions: Not present. Vag. Bleeding: None.  Movement: Present. Denies leaking of fluid.   The following portions of the patient's history were reviewed and updated as appropriate: allergies, current medications, past family history, past medical history, past social history, past surgical history and problem list. Problem list updated.  Objective:   Vitals:   02/17/18 0906  BP: 110/65  Pulse: 91  Weight: 221 lb 14.4 oz (100.7 kg)    Fetal Status:     Movement: Present     General:  Alert, oriented and cooperative. Patient is in no acute distress.  Skin: Skin is warm and dry. No rash noted.   Cardiovascular: Normal heart rate noted  Respiratory: Normal respiratory effort, no problems with respiration noted  Abdomen: Soft, gravid, appropriate for gestational age. Pain/Pressure: Present     Pelvic:  Cervical exam deferred        Extremities: Normal range of motion.  Edema: None  Mental Status: Normal mood and affect. Normal behavior. Normal judgment and thought content.   Urinalysis:      Assessment and Plan:  Pregnancy: G3P1011 at 682w4d  1. Encounter for supervision of other normal pregnancy in second trimester  2. Backache symptom Rx: - Elastic Bandages & Supports (COMFORT FIT MATERNITY SUPP SM) MISC; Wear as directed.  Dispense: 1 each; Refill: 0  Preterm labor symptoms and general obstetric precautions including but not limited to vaginal bleeding, contractions, leaking of fluid and fetal movement were reviewed in detail with the patient. Please refer to After Visit Summary for other counseling recommendations.  Return in about 4 weeks (around 03/17/2018) for  ROB.   Brock BadHarper, Geonna Lockyer A, MD

## 2018-02-17 NOTE — Patient Instructions (Signed)
Constipation, Adult Constipation is when a person:  Poops (has a bowel movement) fewer times in a week than normal.  Has a hard time pooping.  Has poop that is dry, hard, or bigger than normal. Follow these instructions at home: Eating and drinking   Eat foods that have a lot of fiber, such as: ? Fresh fruits and vegetables. ? Whole grains. ? Beans.  Eat less of foods that are high in fat, low in fiber, or overly processed, such as: ? [place] fries. ? Hamburgers. ? Cookies. ? Candy. ? Soda.  Drink enough fluid to keep your pee (urine) clear or pale yellow. General instructions  Exercise regularly or as told by your doctor.  Go to the restroom when you feel like you need to poop. Do not hold it in.  Take over-the-counter and prescription medicines only as told by your doctor. These include any fiber supplements.  Do pelvic floor retraining exercises, such as: ? Doing deep breathing while relaxing your lower belly (abdomen). ? Relaxing your pelvic floor while pooping.  Watch your condition for any changes.  Keep all follow-up visits as told by your doctor. This is important. Contact a doctor if:  You have pain that gets worse.  You have a fever.  You have not pooped for 4 days.  You throw up (vomit).  You are not hungry.  You lose weight.  You are bleeding from the anus.  You have thin, pencil-like poop (stool). Get help right away if:  You have a fever, and your symptoms suddenly get worse.  You leak poop or have blood in your poop.  Your belly feels hard or bigger than normal (is bloated).  You have very bad belly pain.  You feel dizzy or you faint. This information is not intended to replace advice given to you by your health care provider. Make sure you discuss any questions you have with your health care provider. Document Released: 06/19/2007 Document Revised: 07/21/2015 Document Reviewed: 06/21/2015 Elsevier Interactive Patient Education   2019 [employer]  Glucose Tolerance Test During Pregnancy Why am I having this test? The glucose tolerance test (GTT) is done to check how your body processes sugar (glucose). This is one of several tests used to diagnose diabetes that develops during pregnancy (gestational diabetes mellitus). Gestational diabetes is a temporary form of diabetes that some women develop during pregnancy. It usually occurs during the second trimester of pregnancy and goes away after delivery. Testing (screening) for gestational diabetes usually occurs between 24 and 28 weeks of pregnancy. You may have the GTT test after having a 1-hour glucose screening test if the results from that test indicate that you may have gestational diabetes. You may also have this test if:  You have a history of gestational diabetes.  You have a history of giving birth to very large babies or have experienced repeated fetal loss (stillbirth).  You have signs and symptoms of diabetes, such as: ? Changes in your vision. ? Tingling or numbness in your hands or feet. ? Changes in hunger, thirst, and urination that are not otherwise explained by your pregnancy. What is being tested? This test measures the amount of glucose in your blood at different times during a period of 3 hours. This indicates how well your body is able to process glucose. What kind of sample is taken?  Blood samples are required for this test. They are usually collected by inserting a needle into a blood vessel. How do I prepare for  this test?  For 3 days before your test, eat normally. Have plenty of carbohydrate-rich foods.  Follow instructions from your health care provider about: ? Eating or drinking restrictions on the day of the test. You may be asked to not eat or drink anything other than water (fast) starting 8-10 hours before the test. ? Changing or stopping your regular medicines. Some medicines may interfere with this test. Tell a health care  provider about:  All medicines you are taking, including vitamins, herbs, eye drops, creams, and over-the-counter medicines.  Any blood disorders you have.  Any surgeries you have had.  Any medical conditions you have. What happens during the test? First, your blood glucose will be measured. This is referred to as your fasting blood glucose, since you fasted before the test. Then, you will drink a glucose solution that contains a certain amount of glucose. Your blood glucose will be measured again 1, 2, and 3 hours after drinking the solution. This test takes about 3 hours to complete. You will need to stay at the testing location during this time. During the testing period:  Do not eat or drink anything other than the glucose solution.  Do not exercise.  Do not use any products that contain nicotine or tobacco, such as cigarettes and e-cigarettes. If you need help stopping, ask your health care provider. The testing procedure may vary among health care providers and hospitals. How are the results reported? Your results will be reported as milligrams of glucose per deciliter of blood (mg/dL) or millimoles per liter (mmol/L). Your health care provider will compare your results to normal ranges that were established after testing a large group of people (reference ranges). Reference ranges may vary among labs and hospitals. For this test, common reference ranges are:  Fasting: less than 95-105 mg/dL (2.4-4.05.3-5.8 mmol/L).  1 hour after drinking glucose: less than 180-190 mg/dL (10.2-72.510.0-10.5 mmol/L).  2 hours after drinking glucose: less than 155-165 mg/dL (3.6-6.48.6-9.2 mmol/L).  3 hours after drinking glucose: 140-145 mg/dL (4.0-3.47.8-8.1 mmol/L). What do the results mean? Results within reference ranges are considered normal, meaning that your glucose levels are well-controlled. If two or more of your blood glucose levels are high, you may be diagnosed with gestational diabetes. If only one level is high,  your health care provider may suggest repeat testing or other tests to confirm a diagnosis. Talk with your health care provider about what your results mean. Questions to ask your health care provider Ask your health care provider, or the department that is doing the test:  When will my results be ready?  How will I get my results?  What are my treatment options?  What other tests do I need?  What are my next steps? Summary  The glucose tolerance test (GTT) is one of several tests used to diagnose diabetes that develops during pregnancy (gestational diabetes mellitus). Gestational diabetes is a temporary form of diabetes that some women develop during pregnancy.  You may have the GTT test after having a 1-hour glucose screening test if the results from that test indicate that you may have gestational diabetes. You may also have this test if you have any symptoms or risk factors for gestational diabetes.  Talk with your health care provider about what your results mean. This information is not intended to replace advice given to you by your health care provider. Make sure you discuss any questions you have with your health care provider. Document Released: 07/02/2011 Document Revised: 08/12/2016 Document  Reviewed: 08/12/2016 Elsevier Interactive Patient Education  2019 [employer] Inc.  Back Pain in Pregnancy Back pain during pregnancy is common. Back pain may be caused by several factors that are related to changes during your pregnancy. Follow these instructions at home: Managing pain, stiffness, and swelling      If directed, for sudden (acute) back pain, put ice on the painful area. ? Put ice in a plastic bag. ? Place a towel between your skin and the bag. ? Leave the ice on for 20 minutes, 2-3 times per day.  If directed, apply heat to the affected area before you exercise. Use the heat source that your health care provider recommends, such as a moist heat pack or a heating  pad. ? Place a towel between your skin and the heat source. ? Leave the heat on for 20-30 minutes. ? Remove the heat if your skin turns bright red. This is especially important if you are unable to feel pain, heat, or cold. You may have a greater risk of getting burned.  If directed, massage the affected area. Activity  Exercise as told by your health care provider. Gentle exercise is the best way to prevent or manage back pain.  Listen to your body when lifting. If lifting hurts, ask for help or bend your knees. This uses your leg muscles instead of your back muscles.  Squat down when picking up something from the floor. Do not bend over.  Only use bed rest for short periods as told by your health care provider. Bed rest should only be used for the most severe episodes of back pain. Standing, sitting, and lying down  Do not stand in one place for long periods of time.  Use good posture when sitting. Make sure your head rests over your shoulders and is not hanging forward. Use a pillow on your lower back if necessary.  Try sleeping on your side, preferably the left side, with a pregnancy support pillow or 1-2 regular pillows between your legs. ? If you have back pain after a night's rest, your bed may be too soft. ? A firm mattress may provide more support for your back during pregnancy. General instructions  Do not wear high heels.  Eat a healthy diet. Try to gain weight within your health care provider's recommendations.  Use a maternity girdle, elastic sling, or back brace as told by your health care provider.  Take over-the-counter and prescription medicines only as told by your health care provider.  Work with a physical therapist or massage therapist to find ways to manage back pain. Acupuncture or massage therapy may be helpful.  Keep all follow-up visits as told by your health care provider. This is important. Contact a health care provider if:  Your back pain  interferes with your daily activities.  You have increasing pain in other parts of your body. Get help right away if:  You develop numbness, tingling, weakness, or problems with the use of your arms or legs.  You develop severe back pain that is not controlled with medicine.  You have a change in bowel or bladder control.  You develop shortness of breath, dizziness, or you faint.  You develop nausea, vomiting, or sweating.  You have back pain that is a rhythmic, cramping pain similar to labor pains. Labor pain is usually 1-2 minutes apart, lasts for about 1 minute, and involves a bearing down feeling or pressure in your pelvis.  You have back pain and your water breaks  or you have vaginal bleeding.  You have back pain or numbness that travels down your leg.  Your back pain developed after you fell.  You develop pain on one side of your back.  You see blood in your urine.  You develop skin blisters in the area of your back pain. Summary  Back pain may be caused by several factors that are related to changes during your pregnancy.  Follow instructions as told by your health care provider for managing pain, stiffness, and swelling.  Exercise as told by your health care provider. Gentle exercise is the best way to prevent or manage back pain.  Take over-the-counter and prescription medicines only as told by your health care provider.  Keep all follow-up visits as told by your health care provider. This is important. This information is not intended to replace advice given to you by your health care provider. Make sure you discuss any questions you have with your health care provider. Document Released: 04/10/2005 Document Revised: 06/18/2017 Document Reviewed: 06/18/2017 Elsevier Interactive Patient Education  2019 [employer].

## 2018-02-17 NOTE — Progress Notes (Signed)
ROB.  C/o being unable to sleep due to pain/pressure 5-6/10 x 2 weeks

## 2018-02-24 ENCOUNTER — Ambulatory Visit (INDEPENDENT_AMBULATORY_CARE_PROVIDER_SITE_OTHER): Payer: Medicaid Other | Admitting: Obstetrics

## 2018-02-24 ENCOUNTER — Encounter: Payer: Self-pay | Admitting: Obstetrics

## 2018-02-24 VITALS — BP 122/68 | HR 88 | Wt 222.4 lb

## 2018-02-24 DIAGNOSIS — R102 Pelvic and perineal pain: Secondary | ICD-10-CM

## 2018-02-24 DIAGNOSIS — Z3482 Encounter for supervision of other normal pregnancy, second trimester: Secondary | ICD-10-CM

## 2018-02-24 DIAGNOSIS — O26892 Other specified pregnancy related conditions, second trimester: Secondary | ICD-10-CM

## 2018-02-24 DIAGNOSIS — O26899 Other specified pregnancy related conditions, unspecified trimester: Secondary | ICD-10-CM

## 2018-02-24 NOTE — Progress Notes (Signed)
Subjective:  Helen Curtis is a 28 y.o. G3P1011 at [redacted]w[redacted]d being seen today for ongoing prenatal care.  She is currently monitored for the following issues for this low-risk pregnancy and has Obesity and Supervision of normal pregnancy on their problem list.  Patient reports pelvic symphysis pain.  Contractions: Irregular. Vag. Bleeding: None.  Movement: Present. Denies leaking of fluid.   The following portions of the patient's history were reviewed and updated as appropriate: allergies, current medications, past family history, past medical history, past social history, past surgical history and problem list. Problem list updated.  Objective:   Vitals:   02/24/18 1545  BP: 122/68  Pulse: 88  Weight: 222 lb 6.4 oz (100.9 kg)    Fetal Status: Fetal Heart Rate (bpm): 150   Movement: Present     General:  Alert, oriented and cooperative. Patient is in no acute distress.  Skin: Skin is warm and dry. No rash noted.   Cardiovascular: Normal heart rate noted  Respiratory: Normal respiratory effort, no problems with respiration noted  Abdomen: Soft, gravid, appropriate for gestational age. Pain/Pressure: Present     Pelvic:  Cervical exam deferred        Extremities: Normal range of motion.  Edema: None  Mental Status: Normal mood and affect. Normal behavior. Normal judgment and thought content.   Urinalysis:      Assessment and Plan:  Pregnancy: G3P1011 at [redacted]w[redacted]d  1. Encounter for supervision of other normal pregnancy in second trimester  2. Pregnancy related pelvic pain, antepartum - continue maternity belt - Tylenol prn  Preterm labor symptoms and general obstetric precautions including but not limited to vaginal bleeding, contractions, leaking of fluid and fetal movement were reviewed in detail with the patient. Please refer to After Visit Summary for other counseling recommendations.  Return in about 3 weeks (around 03/17/2018) for ROB, 2 hour OGTT.   Brock Bad, MD

## 2018-02-24 NOTE — Progress Notes (Signed)
Pt presents for ROB has questions about pelvic pressure.

## 2018-02-26 ENCOUNTER — Telehealth: Payer: Self-pay | Admitting: *Deleted

## 2018-02-26 NOTE — Telephone Encounter (Signed)
Pt called to office stating she wants a letter for work due to increase in pelvic pain/pressure.   Attempt to return call. No answer, VM full.

## 2018-03-11 DIAGNOSIS — Z3483 Encounter for supervision of other normal pregnancy, third trimester: Secondary | ICD-10-CM

## 2018-03-17 ENCOUNTER — Encounter: Payer: Self-pay | Admitting: Obstetrics

## 2018-03-17 ENCOUNTER — Other Ambulatory Visit: Payer: Self-pay

## 2018-03-17 ENCOUNTER — Other Ambulatory Visit: Payer: Medicaid Other

## 2018-03-17 ENCOUNTER — Ambulatory Visit (INDEPENDENT_AMBULATORY_CARE_PROVIDER_SITE_OTHER): Payer: Medicaid Other | Admitting: Obstetrics

## 2018-03-17 VITALS — BP 113/66 | HR 95 | Wt 226.0 lb

## 2018-03-17 DIAGNOSIS — Z23 Encounter for immunization: Secondary | ICD-10-CM | POA: Diagnosis not present

## 2018-03-17 DIAGNOSIS — Z3483 Encounter for supervision of other normal pregnancy, third trimester: Secondary | ICD-10-CM

## 2018-03-17 DIAGNOSIS — Z3480 Encounter for supervision of other normal pregnancy, unspecified trimester: Secondary | ICD-10-CM

## 2018-03-17 DIAGNOSIS — Z3A28 28 weeks gestation of pregnancy: Secondary | ICD-10-CM

## 2018-03-17 NOTE — Progress Notes (Signed)
Subjective:  Helen Curtis is a 29 y.o. G3P1011 at [redacted]w[redacted]d being seen today for ongoing prenatal care.  She is currently monitored for the following issues for this low-risk pregnancy and has Obesity and Supervision of normal pregnancy on their problem list.  Patient reports no complaints.  Contractions: Irritability. Vag. Bleeding: None.  Movement: Present. Denies leaking of fluid.   The following portions of the patient's history were reviewed and updated as appropriate: allergies, current medications, past family history, past medical history, past social history, past surgical history and problem list. Problem list updated.  Objective:   Vitals:   03/17/18 0846  BP: 113/66  Pulse: 95  Weight: 226 lb (102.5 kg)    Fetal Status:     Movement: Present     General:  Alert, oriented and cooperative. Patient is in no acute distress.  Skin: Skin is warm and dry. No rash noted.   Cardiovascular: Normal heart rate noted  Respiratory: Normal respiratory effort, no problems with respiration noted  Abdomen: Soft, gravid, appropriate for gestational age. Pain/Pressure: Present     Pelvic:  Cervical exam deferred        Extremities: Normal range of motion.  Edema: None  Mental Status: Normal mood and affect. Normal behavior. Normal judgment and thought content.   Urinalysis:      Assessment and Plan:  Pregnancy: G3P1011 at [redacted]w[redacted]d  1. Supervision of other normal pregnancy, antepartum Rx: - Glucose Tolerance, 2 Hours w/1 Hour - CBC - HIV antibody (with reflex) - RPR - Tdap vaccine greater than or equal to 7yo IM  Preterm labor symptoms and general obstetric precautions including but not limited to vaginal bleeding, contractions, leaking of fluid and fetal movement were reviewed in detail with the patient. Please refer to After Visit Summary for other counseling recommendations.  Return in about 2 weeks (around 03/31/2018) for ROB.   Brock Bad, MD

## 2018-03-17 NOTE — Progress Notes (Signed)
ROB.  TDAP given in LD, tolerated well. 

## 2018-03-18 ENCOUNTER — Inpatient Hospital Stay (HOSPITAL_COMMUNITY)
Admission: AD | Admit: 2018-03-18 | Discharge: 2018-03-18 | Disposition: A | Discharge status: Home / Self Care | Payer: Medicaid Other | Attending: Obstetrics and Gynecology | Admitting: Obstetrics and Gynecology

## 2018-03-18 ENCOUNTER — Encounter (HOSPITAL_COMMUNITY): Payer: Self-pay

## 2018-03-18 ENCOUNTER — Other Ambulatory Visit: Payer: Self-pay

## 2018-03-18 DIAGNOSIS — Z3A28 28 weeks gestation of pregnancy: Secondary | ICD-10-CM

## 2018-03-18 DIAGNOSIS — O26893 Other specified pregnancy related conditions, third trimester: Secondary | ICD-10-CM | POA: Diagnosis not present

## 2018-03-18 DIAGNOSIS — O99891 Other specified diseases and conditions complicating pregnancy: Secondary | ICD-10-CM

## 2018-03-18 DIAGNOSIS — M549 Dorsalgia, unspecified: Secondary | ICD-10-CM | POA: Diagnosis not present

## 2018-03-18 DIAGNOSIS — M545 Low back pain: Secondary | ICD-10-CM | POA: Insufficient documentation

## 2018-03-18 DIAGNOSIS — O9989 Other specified diseases and conditions complicating pregnancy, childbirth and the puerperium: Secondary | ICD-10-CM | POA: Diagnosis not present

## 2018-03-18 DIAGNOSIS — Z3480 Encounter for supervision of other normal pregnancy, unspecified trimester: Secondary | ICD-10-CM

## 2018-03-18 LAB — URINALYSIS, ROUTINE W REFLEX MICROSCOPIC
Bilirubin Urine: NEGATIVE
Glucose, UA: NEGATIVE mg/dL
HGB URINE DIPSTICK: NEGATIVE
Ketones, ur: 20 mg/dL — AB
Leukocytes,Ua: NEGATIVE
Nitrite: NEGATIVE
Protein, ur: NEGATIVE mg/dL
Specific Gravity, Urine: 1.019 (ref 1.005–1.030)
pH: 6 (ref 5.0–8.0)

## 2018-03-18 LAB — CBC
Hematocrit: 30.7 % — ABNORMAL LOW (ref 34.0–46.6)
Hemoglobin: 10.4 g/dL — ABNORMAL LOW (ref 11.1–15.9)
MCH: 27.8 pg (ref 26.6–33.0)
MCHC: 33.9 g/dL (ref 31.5–35.7)
MCV: 82 fL (ref 79–97)
Platelets: 192 10*3/uL (ref 150–450)
RBC: 3.74 x10E6/uL — ABNORMAL LOW (ref 3.77–5.28)
RDW: 14.2 % (ref 11.7–15.4)
WBC: 7.3 10*3/uL (ref 3.4–10.8)

## 2018-03-18 LAB — GLUCOSE TOLERANCE, 2 HOURS W/ 1HR
Glucose, 1 hour: 115 mg/dL (ref 65–179)
Glucose, 2 hour: 81 mg/dL (ref 65–152)
Glucose, Fasting: 68 mg/dL (ref 65–91)

## 2018-03-18 LAB — HIV ANTIBODY (ROUTINE TESTING W REFLEX): HIV Screen 4th Generation wRfx: NONREACTIVE

## 2018-03-18 LAB — RPR: RPR Ser Ql: NONREACTIVE

## 2018-03-18 MED ORDER — CYCLOBENZAPRINE HCL 10 MG PO TABS
10.0000 mg | ORAL_TABLET | Freq: Once | ORAL | Status: AC
  Administered 2018-03-18: 10 mg via ORAL
  Filled 2018-03-18: qty 1

## 2018-03-18 MED ORDER — CYCLOBENZAPRINE HCL 10 MG PO TABS: 10.0000 mg | ORAL_TABLET | Freq: Two times a day (BID) | ORAL | 0 refills | Status: DC | PRN

## 2018-03-18 NOTE — MAU Note (Signed)
Back pain and pressure, tightness and pressure in abd.  Started around 1300, comes and goes. No bleeding or leaking.  Denies PTL

## 2018-03-18 NOTE — MAU Provider Note (Signed)
History     CSN: [id number]  Arrival date and time: 03/18/18 1606   First Provider Initiated Contact with Patient 03/18/18 1712      Chief Complaint  Patient presents with  . Back Pain  . Abdominal Pain   HPI  Ms.  Helen Curtis is a 28 y.o. year old G54P1011 female at [redacted]w[redacted]d weeks gestation who presents to MAU reporting lower back pain and pressure, intermittent tightness that started at 1300 after she got off work today. She denies VB or LOF. Unsure of UC's. (+) FM.  Past Medical History:  Diagnosis Date  . 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 History of chicken pox   . History of chlamydia   . No pertinent past medical history     Past Surgical History:  Procedure Laterality Date  . THERAPEUTIC ABORTION      Family History  Problem Relation Age of Onset  . Hypertension Mother   . Seizures Mother   . Hypertension Maternal Grandmother   . Cancer Paternal Grandmother   . Alzheimer's disease Paternal Grandmother   . Cancer Paternal Grandfather   . Anesthesia problems Neg Hx     Social History   Tobacco Use  . Smoking status: Never Smoker  . Smokeless tobacco: Never Used  Substance Use Topics  . Alcohol use: Not Currently  . Drug use: No    Allergies: No Known Allergies  Medications Prior to Admission  Medication Sig Dispense Refill Last Dose  . acetaminophen (TYLENOL) 325 MG tablet Take 650 mg by mouth every 6 (six) hours as needed for headache.    Not Taking  . butalbital-acetaminophen-caffeine (FIORICET, ESGIC) 50-325-40 MG tablet Take 1-2 tablets by mouth every 6 (six) hours as needed for headache. (Patient not taking: Reported on 02/24/2018) 20 tablet 0 Not Taking  . cetirizine-pseudoephedrine (ZYRTEC-D) 5-120 MG tablet Take 1 tablet by mouth daily. (Patient not taking: Reported on 02/24/2018) 30 tablet 0 Not Taking  . docusate sodium (COLACE) 100 MG capsule Take 1 capsule (100 mg total) by mouth 2 (two) times daily. (Patient  not taking: Reported on 02/24/2018) 60 capsule 5 Not Taking  . Doxylamine-Pyridoxine (DICLEGIS) 10-10 MG TBEC Take 2 tablets by mouth at bedtime as needed. (Patient not taking: Reported on 01/20/2018) 100 tablet 5 Not Taking  . Elastic Bandages & Supports (COMFORT FIT MATERNITY SUPP SM) MISC Wear as directed. 1 each 0 Taking  . magnesium oxide (MAG-OX) 400 (241.3 Mg) MG tablet Take 1 tablet (400 mg total) by mouth daily. (Patient not taking: Reported on 01/20/2018) 60 tablet 1 Not Taking  . metoCLOPramide (REGLAN) 10 MG tablet Take 1 tablet (10 mg total) by mouth every 6 (six) hours. (Patient not taking: Reported on 02/24/2018) 30 tablet 0 Not Taking  . ondansetron (ZOFRAN ODT) 4 MG disintegrating tablet Take 1 tablet (4 mg total) by mouth every 8 (eight) hours as needed for nausea or vomiting. (Patient not taking: Reported on 12/23/2017) 3 tablet 0 Not Taking  . pantoprazole (PROTONIX) 20 MG tablet Take 1 tablet (20 mg total) by mouth daily. (Patient not taking: Reported on 02/24/2018) 30 tablet 0 Not Taking  . Prenatal Vit-Fe Phos-FA-Omega (VITAFOL GUMMIES) 3.33-0.333-34.8 MG CHEW Chew 3 tablets by mouth daily before breakfast. 90 tablet 11 Taking  . Prenatal-Fe Fum-Methf-FA w/o A (VITAFOL-NANO) 18-0.6-0.4 MG TABS Take 1 tablet by mouth daily with breakfast. (Patient not taking: Reported on 02/24/2018) 90 tablet 4 Not Taking  Review of Systems  Constitutional: Negative.   HENT: Negative.   Eyes: Negative.   Respiratory: Negative.   Cardiovascular: Negative.   Gastrointestinal: Negative.   Endocrine: Negative.   Genitourinary: Positive for pelvic pain (pressure).  Musculoskeletal: Positive for back pain.  Skin: Negative.   Allergic/Immunologic: Negative.   Neurological: Negative.   Hematological: Negative.   Psychiatric/Behavioral: Negative.    Physical Exam   Blood pressure (!) 116/56, pulse (!) 109, temperature 98.6 F (37 C), temperature source Oral, resp. rate 18, weight 102.5 kg, last  menstrual period 08/29/2017, SpO2 100 %.  Physical Exam  Nursing note and vitals reviewed. Constitutional: She is oriented to person, place, and time. She appears well-developed and well-nourished.  HENT:  Head: Normocephalic and atraumatic.  Eyes: Pupils are equal, round, and reactive to light.  Neck: Normal range of motion.  Cardiovascular: Normal rate, regular rhythm and normal heart sounds.  Respiratory: Effort normal and breath sounds normal.  GI: Soft. Bowel sounds are normal. There is no CVA tenderness.  Genitourinary:    Genitourinary Comments: Pelvic declined   Musculoskeletal: Normal range of motion.  Neurological: She is alert and oriented to person, place, and time. She has normal reflexes.  Skin: Skin is warm and dry.  Psychiatric: She has a normal mood and affect. Her behavior is normal. Judgment and thought content normal.    MAU Course  Procedures  MDM CCUA Flexeril 10 mg -- resolved pain NST - FHR: 140 bpm / moderate variability / accels present / decels absent / TOCO: UI noted  Results for orders placed or performed during the hospital encounter of 03/18/18 (from the past 24 hour(s))  Urinalysis, Routine w reflex microscopic     Status: Abnormal   Collection Time: 03/18/18  4:45 PM  Result Value Ref Range   Color, Urine YELLOW YELLOW   APPearance HAZY (A) CLEAR   Specific Gravity, Urine 1.019 1.005 - 1.030   pH 6.0 5.0 - 8.0   Glucose, UA NEGATIVE NEGATIVE mg/dL   Hgb urine dipstick NEGATIVE NEGATIVE   Bilirubin Urine NEGATIVE NEGATIVE   Ketones, ur 20 (A) NEGATIVE mg/dL   Protein, ur NEGATIVE NEGATIVE mg/dL   Nitrite NEGATIVE NEGATIVE   Leukocytes,Ua NEGATIVE NEGATIVE    Assessment and Plan  Back pain affecting pregnancy in third trimester - Plan: Discharge patient - Rx Flexeril 10 mg BID prn pain - Information provided on back pain in pregnancy - Keep scheduled appt with [facility] - Patient verbalized an understanding of the plan of care and agrees.      Helen Mora, MSN, CNM 03/18/2018, 5:12 PM

## 2018-03-29 ENCOUNTER — Inpatient Hospital Stay (HOSPITAL_COMMUNITY)
Admission: AD | Admit: 2018-03-29 | Discharge: 2018-03-29 | Disposition: A | Discharge status: Home / Self Care | Payer: Medicaid Other | Attending: Obstetrics & Gynecology | Admitting: Obstetrics & Gynecology

## 2018-03-29 ENCOUNTER — Encounter (HOSPITAL_COMMUNITY): Payer: Self-pay

## 2018-03-29 ENCOUNTER — Other Ambulatory Visit: Payer: Self-pay

## 2018-03-29 DIAGNOSIS — O26893 Other specified pregnancy related conditions, third trimester: Secondary | ICD-10-CM | POA: Diagnosis not present

## 2018-03-29 DIAGNOSIS — N898 Other specified noninflammatory disorders of vagina: Secondary | ICD-10-CM | POA: Insufficient documentation

## 2018-03-29 DIAGNOSIS — M549 Dorsalgia, unspecified: Secondary | ICD-10-CM | POA: Insufficient documentation

## 2018-03-29 DIAGNOSIS — Z0371 Encounter for suspected problem with amniotic cavity and membrane ruled out: Secondary | ICD-10-CM

## 2018-03-29 DIAGNOSIS — Z3A31 31 weeks gestation of pregnancy: Secondary | ICD-10-CM

## 2018-03-29 DIAGNOSIS — R109 Unspecified abdominal pain: Secondary | ICD-10-CM | POA: Insufficient documentation

## 2018-03-29 DIAGNOSIS — O99891 Other specified diseases and conditions complicating pregnancy: Secondary | ICD-10-CM

## 2018-03-29 DIAGNOSIS — O9989 Other specified diseases and conditions complicating pregnancy, childbirth and the puerperium: Secondary | ICD-10-CM

## 2018-03-29 LAB — URINALYSIS, ROUTINE W REFLEX MICROSCOPIC
Bilirubin Urine: NEGATIVE
Glucose, UA: NEGATIVE mg/dL
Hgb urine dipstick: NEGATIVE
Ketones, ur: NEGATIVE mg/dL
Leukocytes,Ua: NEGATIVE
Nitrite: NEGATIVE
Protein, ur: NEGATIVE mg/dL
Specific Gravity, Urine: 1.014 (ref 1.005–1.030)
pH: 7 (ref 5.0–8.0)

## 2018-03-29 LAB — POCT FERN TEST: POCT Fern Test: NEGATIVE

## 2018-03-29 MED ORDER — HYDROMORPHONE HCL 1 MG/ML IJ SOLN: 0.5000 mg | Freq: Once | INTRAMUSCULAR | Status: DC

## 2018-03-29 MED ORDER — LACTATED RINGERS IV SOLN: INTRAVENOUS | Status: DC

## 2018-03-29 NOTE — MAU Provider Note (Signed)
Chief Complaint:  Abdominal Pain; Back Pain; and Rupture of Membranes   First Provider Initiated Contact with Patient 03/29/18 1302     HPI: Helen Curtis is a 28 y.o. G3P1011 at [redacted]w[redacted]d who presents to maternity admissions reporting back pain, abdominal pain, & vaginal leaking. Symptoms began this morning. Feels intermittent low back pain that radiates to her lower abdomen. Can't tell how frequently this occurs. Also feels leaking every time the baby moves. Has not seen fluid in her underwear. Denies vaginal bleeding, dysuria, n/v/d, or recent intercourse.  Normal fetal movement.   Location: back/abdomen Quality: aching/pressure Severity: 10/6 /10 in pain scale Duration: 1 day Timing: intermittent Modifying factors: none Associated signs and symptoms: vaginal discharge  Past Medical History:  Diagnosis Date  . 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 History of chicken pox   . History of chlamydia   . No pertinent past medical history    OB History  Gravida Para Term Preterm AB Living  3 1 1  0 1 1  SAB TAB Ectopic Multiple Live Births  0 1 0 0 1    # Outcome Date GA Lbr Len/2nd Weight Sex Delivery Anes PTL Lv  3 Current           2 Term 01/12/11 [redacted]w[redacted]d 17:39 / 00:39 3184 g F Vag-Spont EPI  LIV     Birth Comments: Molding & R sided caput  1 TAB            Past Surgical History:  Procedure Laterality Date  . THERAPEUTIC ABORTION     Family History  Problem Relation Age of Onset  . Hypertension Mother   . Seizures Mother   . Hypertension Maternal Grandmother   . Cancer Paternal Grandmother   . Alzheimer's disease Paternal Grandmother   . Cancer Paternal Grandfather   . Anesthesia problems Neg Hx    Social History   Tobacco Use  . Smoking status: Never Smoker  . Smokeless tobacco: Never Used  Substance Use Topics  . Alcohol use: Not Currently  . Drug use: No   No Known Allergies No medications prior to admission.    I have reviewed  patient's Past Medical Hx, Surgical Hx, Family Hx, Social Hx, medications and allergies.   ROS:  Review of Systems  Constitutional: Negative.   Gastrointestinal: Positive for abdominal pain.  Genitourinary: Positive for vaginal discharge. Negative for vaginal bleeding.  Musculoskeletal: Positive for back pain.    Physical Exam   Patient Vitals for the past 24 hrs:  BP Temp Temp src Pulse Resp SpO2 Weight  03/29/18 1335 (!) 113/53 - - 89 - - -  03/29/18 1236 104/71 98.6 F (37 C) Oral 90 18 100 % 103.8 kg    Constitutional: Well-developed, well-nourished female in no acute distress.  Cardiovascular: normal rate & rhythm, no murmur Respiratory: normal effort, lung sounds clear throughout GI: Abd soft, non-tender, gravid appropriate for gestational age. Pos BS x 4 MS: Extremities nontender, no edema, normal ROM Neurologic: Alert and oriented x 4.  GU:      SSE: No pooling of fluid. No blood.    Dilation: Closed Effacement (%): Thick Cervical Position: Posterior Exam by:: Judeth Horn NP  NST:  Baseline: 145 bpm, Variability: Good {> 6 bpm), Accelerations: Reactive and Decelerations: Absent   Labs: Results for orders placed or performed during the hospital encounter of 03/29/18 (from the past 24 hour(s))  Urinalysis, Routine w reflex microscopic  Status: Abnormal   Collection Time: 03/29/18 12:43 PM  Result Value Ref Range   Color, Urine YELLOW YELLOW   APPearance HAZY (A) CLEAR   Specific Gravity, Urine 1.014 1.005 - 1.030   pH 7.0 5.0 - 8.0   Glucose, UA NEGATIVE NEGATIVE mg/dL   Hgb urine dipstick NEGATIVE NEGATIVE   Bilirubin Urine NEGATIVE NEGATIVE   Ketones, ur NEGATIVE NEGATIVE mg/dL   Protein, ur NEGATIVE NEGATIVE mg/dL   Nitrite NEGATIVE NEGATIVE   Leukocytes,Ua NEGATIVE NEGATIVE  POCT fern test     Status: None   Collection Time: 03/29/18  1:23 PM  Result Value Ref Range   POCT Fern Test Negative = intact amniotic membranes     Imaging:  No results  found.  MAU Course: Orders Placed This Encounter  Procedures  . Urinalysis, Routine w reflex microscopic  . POCT fern test  . Discharge patient   Meds ordered this encounter  Medications  . DISCONTD: lactated ringers infusion  . DISCONTD: HYDROmorphone (DILAUDID) injection 0.5 mg    MDM: No contractions on monitor. Cervix closed/thick.  SSE performed; no pooling of fluid. Fern negative U/a with no signs of infection  Assessment: 1. Encounter for suspected PROM, with rupture of membranes not found   2. Back pain affecting pregnancy in third trimester   3. [redacted] weeks gestation of pregnancy     Plan: Discharge home in stable condition.  Preterm Labor precautions and fetal kick counts   Allergies as of 03/29/2018   No Known Allergies     Medication List    STOP taking these medications   butalbital-acetaminophen-caffeine 50-325-40 MG tablet Commonly known as:  FIORICET, ESGIC   cetirizine-pseudoephedrine 5-120 MG tablet Commonly known as:  ZYRTEC-D   docusate sodium 100 MG capsule Commonly known as:  Colace   Doxylamine-Pyridoxine 10-10 MG Tbec Commonly known as:  Diclegis   magnesium oxide 400 (241.3 Mg) MG tablet Commonly known as:  MAG-OX   metoCLOPramide 10 MG tablet Commonly known as:  REGLAN   ondansetron 4 MG disintegrating tablet Commonly known as:  Zofran ODT   pantoprazole 20 MG tablet Commonly known as:  Protonix   Vitafol-Nano 18-0.6-0.4 MG Tabs     TAKE these medications   acetaminophen 325 MG tablet Commonly known as:  TYLENOL Take 650 mg by mouth every 6 (six) hours as needed for headache.   Comfort Fit Maternity Supp Sm Misc Wear as directed.   cyclobenzaprine 10 MG tablet Commonly known as:  FLEXERIL Take 1 tablet (10 mg total) by mouth 2 (two) times daily as needed for muscle spasms.   Vitafol Gummies 3.33-0.333-34.8 MG Chew Chew 3 tablets by mouth daily before breakfast.       Judeth Horn, NP 03/29/2018 2:19 PM

## 2018-03-29 NOTE — Discharge Instructions (Signed)
Warning Signs During Pregnancy °A pregnancy lasts about 40 weeks, starting from the first day of your last period until the baby is born. Pregnancy is divided into three phases called trimesters. °· The first trimester refers to week 1 through week 13 of pregnancy. °· The second trimester is the start of week 14 through the end of week 27. °· The third trimester is the start of week 28 until you deliver your baby. °During each trimester of pregnancy, certain signs and symptoms may indicate a problem. Talk with your health care provider about your current health and any medical conditions you have. Make sure you know the symptoms that you should watch for and report. °How does this affect me? ° °Warning signs in the first trimester °While some changes during the first trimester may be uncomfortable, most do not represent a serious problem. Let your health care provider know if you have any of the following warning signs in the first trimester: °· You cannot eat or drink without vomiting, and this lasts for longer than a day. °· You have vaginal bleeding or spotting along with menstrual-like cramping. °· You have diarrhea for longer than a day. °· You have a fever or other signs of infection, such as: °? Pain or burning when you urinate. °? Foul smelling or thick or yellowish vaginal discharge. °Warning signs in the second trimester °As your baby grows and changes during the second trimester, there are additional signs and symptoms that may indicate a problem. These include: °· Signs and symptoms of infection, including a fever. °· Signs or symptoms of a miscarriage or preterm labor, such as regular contractions, menstrual-like cramping, or lower abdominal pain. °· Bloody or watery vaginal discharge or obvious vaginal bleeding. °· Feeling like your heart is pounding. °· Having trouble breathing. °· Nausea, vomiting, or diarrhea that lasts for longer than a day. °· Craving non-food items, such as clay, chalk, or dirt.  This may be a sign of a very treatable medical condition called pica. °Later in your second trimester, watch for signs and symptoms of a serious medical condition called preeclampsia.These include: °· Changes in your vision. °· A severe headache that does not go away. °· Nausea and vomiting. °It is also important to notice if your baby stops moving or moves less than usual during this time. °Warning signs in the third trimester °As you approach the third trimester, your baby is growing and your body is preparing for the birth of your baby. In your third trimester, be sure to let your health care provider know if: °· You have signs and symptoms of infection, including a fever. °· You have vaginal bleeding. °· You notice that your baby is moving less than usual or is not moving. °· You have nausea, vomiting, or diarrhea that lasts for longer than a day. °· You have a severe headache that does not go away. °· You have vision changes, including seeing spots or having blurry or double vision. °· You have increased swelling in your hands or face. °How does this affect my baby? °Throughout your pregnancy, always report any of the warning signs of a problem to your health care provider. This can help prevent complications that may affect your baby, including: °· Increased risk for premature birth. °· Infection that may be transmitted to your baby. °· Increased risk for stillbirth. °Contact a health care provider if: °· You have any of the warning signs of a problem for the current trimester of your pregnancy. °·   Any of the following apply to you during any trimester of pregnancy: °? You have strong emotions, such as sadness or anxiety, that interfere with work or personal relationships. °? You feel unsafe in your home and need help finding a safe place to live. °? You are using tobacco products, alcohol, or drugs and you need help to stop. °Get help right away if: °You have signs or symptoms of labor before 37 weeks of  pregnancy. These include: °· Contractions that are 5 minutes or less apart, or that increase in frequency, intensity, or length. °· Sudden, sharp abdominal pain or low back pain. °· Uncontrolled gush or trickle of fluid from your vagina. °Summary °· A pregnancy lasts about 40 weeks, starting from the first day of your last period until the baby is born. Pregnancy is divided into three phases called trimesters. Each trimester has warning signs to watch for. °· Always report any warning signs to your health care provider in order to prevent complications that may affect both you and your baby. °· Talk with your health care provider about your current health and any medical conditions you have. Make sure you know the symptoms that you should watch for and report. °This information is not intended to replace advice given to you by your health care provider. Make sure you discuss any questions you have with your health care provider. °Document Released: 10/17/2016 Document Revised: 10/17/2016 Document Reviewed: 10/17/2016 °Elsevier Interactive Patient Education © 2019 [employer] ° ° ° ° °Fetal Movement Counts °Patient Name: ________________________________________________ Patient Due Date: ____________________ °What is a fetal movement count? ° °A fetal movement count is the number of times that you feel your baby move during a certain amount of time. This may also be called a fetal kick count. A fetal movement count is recommended for every pregnant woman. You may be asked to start counting fetal movements as early as week 28 of your pregnancy. °Pay attention to when your baby is most active. You may notice your baby's sleep and wake cycles. You may also notice things that make your baby move more. You should do a fetal movement count: °· When your baby is normally most active. °· At the same time each day. °A good time to count movements is while you are resting, after having something to eat and drink. °How do I  count fetal movements? °1. Find a quiet, comfortable area. Sit, or lie down on your side. °2. Write down the date, the start time and stop time, and the number of movements that you felt between those two times. Take this information with you to your health care visits. °3. For 2 hours, count kicks, flutters, swishes, rolls, and jabs. You should feel at least 10 movements during 2 hours. °4. You may stop counting after you have felt 10 movements. °5. If you do not feel 10 movements in 2 hours, have something to eat and drink. Then, keep resting and counting for 1 hour. If you feel at least 4 movements during that hour, you may stop counting. °Contact a health care provider if: °· You feel fewer than 4 movements in 2 hours. °· Your baby is not moving like he or she usually does. °Date: ____________ Start time: ____________ Stop time: ____________ Movements: ____________ °Date: ____________ Start time: ____________ Stop time: ____________ Movements: ____________ °Date: ____________ Start time: ____________ Stop time: ____________ Movements: ____________ °Date: ____________ Start time: ____________ Stop time: ____________ Movements: ____________ °Date: ____________ Start time: ____________ Stop time:   ____________ Movements: ____________ °Date: ____________ Start time: ____________ Stop time: ____________ Movements: ____________ °Date: ____________ Start time: ____________ Stop time: ____________ Movements: ____________ °Date: ____________ Start time: ____________ Stop time: ____________ Movements: ____________ °Date: ____________ Start time: ____________ Stop time: ____________ Movements: ____________ °This information is not intended to replace advice given to you by your health care provider. Make sure you discuss any questions you have with your health care provider. °Document Released: 01/30/2006 Document Revised: 08/30/2015 Document Reviewed: 02/09/2015 °Elsevier Interactive Patient Education © 2019 [employer] ° °

## 2018-03-29 NOTE — MAU Note (Signed)
started about 0600,  Pain in abd and low back.  Every time she feels a movement, there is a little gush; not really seeing anything, but she feels it.

## 2018-03-31 ENCOUNTER — Encounter: Payer: Medicaid Other | Admitting: Obstetrics

## 2018-04-01 ENCOUNTER — Encounter: Payer: Self-pay | Admitting: Obstetrics

## 2018-04-01 ENCOUNTER — Other Ambulatory Visit: Payer: Self-pay

## 2018-04-01 ENCOUNTER — Ambulatory Visit (INDEPENDENT_AMBULATORY_CARE_PROVIDER_SITE_OTHER): Payer: Medicaid Other | Admitting: Obstetrics

## 2018-04-01 DIAGNOSIS — Z3483 Encounter for supervision of other normal pregnancy, third trimester: Secondary | ICD-10-CM

## 2018-04-01 DIAGNOSIS — Z3480 Encounter for supervision of other normal pregnancy, unspecified trimester: Secondary | ICD-10-CM

## 2018-04-01 NOTE — Progress Notes (Signed)
Subjective:  Helen Curtis is a 28 y.o. G3P1011 at [redacted]w[redacted]d being seen today for ongoing prenatal care.  She is currently monitored for the following issues for this low-risk pregnancy and has Obesity; Supervision of normal pregnancy; and Back pain affecting pregnancy in third trimester on their problem list.  Patient reports no complaints.  Contractions: Irritability. Vag. Bleeding: None.  Movement: Present. Denies leaking of fluid.   The following portions of the patient's history were reviewed and updated as appropriate: allergies, current medications, past family history, past medical history, past social history, past surgical history and problem list. Problem list updated.  Objective:   Vitals:   04/01/18 1002  BP: 117/67  Pulse: 97  Weight: 230 lb (104.3 kg)    Fetal Status:     Movement: Present     General:  Alert, oriented and cooperative. Patient is in no acute distress.  Skin: Skin is warm and dry. No rash noted.   Cardiovascular: Normal heart rate noted  Respiratory: Normal respiratory effort, no problems with respiration noted  Abdomen: Soft, gravid, appropriate for gestational age. Pain/Pressure: Present     Pelvic:  Cervical exam deferred        Extremities: Normal range of motion.  Edema: Trace  Mental Status: Normal mood and affect. Normal behavior. Normal judgment and thought content.   Urinalysis:      Assessment and Plan:  Pregnancy: G3P1011 at [redacted]w[redacted]d  1. Supervision of other normal pregnancy, antepartum   Preterm labor symptoms and general obstetric precautions including but not limited to vaginal bleeding, contractions, leaking of fluid and fetal movement were reviewed in detail with the patient. Please refer to After Visit Summary for other counseling recommendations.  Return in about 2 weeks (around 04/15/2018) for ROB.   Brock Bad, MD

## 2018-04-01 NOTE — Progress Notes (Signed)
Pt recently seen at [facility] 03/29/18

## 2018-04-08 ENCOUNTER — Telehealth: Payer: Self-pay | Admitting: *Deleted

## 2018-04-08 NOTE — Telephone Encounter (Signed)
Pt called to office with back pain and pressure.  Return call to pt. Pt states she has been having severe pressure and increase back pain since last night.  Pt states it starts in her back and will go around to her abd.  Pt reports +FM, denies LOF or bleeding.   Pt was advised to be seen at [facility] for eval for preterm ctx. Pt states she may be able to go today if she can get care for her other child.  Pt advised to contact office in morning if she is unable to go.   Pt made aware if she is to have any leaking/fluid or bleeding to go to hospital urgently. Pt states understanding.

## 2018-04-15 ENCOUNTER — Other Ambulatory Visit: Payer: Self-pay

## 2018-04-15 ENCOUNTER — Encounter: Payer: Self-pay | Admitting: Obstetrics

## 2018-04-15 ENCOUNTER — Ambulatory Visit (INDEPENDENT_AMBULATORY_CARE_PROVIDER_SITE_OTHER): Payer: Medicaid Other | Admitting: Obstetrics

## 2018-04-15 DIAGNOSIS — Z3A32 32 weeks gestation of pregnancy: Secondary | ICD-10-CM

## 2018-04-15 DIAGNOSIS — Z3480 Encounter for supervision of other normal pregnancy, unspecified trimester: Secondary | ICD-10-CM

## 2018-04-15 DIAGNOSIS — Z3483 Encounter for supervision of other normal pregnancy, third trimester: Secondary | ICD-10-CM

## 2018-04-15 NOTE — Progress Notes (Addendum)
Subjective:  Helen Curtis is a 28 y.o. G3P1011 at [redacted]w[redacted]d being seen today for ongoing prenatal care.  She is currently monitored for the following issues for this low-risk pregnancy and has Obesity; Supervision of normal pregnancy; and Back pain affecting pregnancy in third trimester on their problem list.  Patient reports backache and heartburn.  Contractions: Irritability. Vag. Bleeding: None.  Movement: Present. Denies leaking of fluid.   The following portions of the patient's history were reviewed and updated as appropriate: allergies, current medications, past family history, past medical history, past social history, past surgical history and problem list. Problem list updated.  Objective:  There were no vitals filed for this visit.  Fetal Status:     Movement: Present      PE:  Deferred  Urinalysis:      Assessment and Plan:  Pregnancy: G3P1011 at [redacted]w[redacted]d  1. Supervision of other normal pregnancy, antepartum Rx: - Babyscripts Schedule Optimization  Preterm labor symptoms and general obstetric precautions including but not limited to vaginal bleeding, contractions, leaking of fluid and fetal movement were reviewed in detail with the patient. Please refer to After Visit Summary for other counseling recommendations.  Return in about 2 weeks (around 04/29/2018) for ROB.   Brock Bad, MD

## 2018-04-15 NOTE — Progress Notes (Signed)
ROB telehealth visit - No complaints per pt.

## 2018-04-30 ENCOUNTER — Inpatient Hospital Stay (HOSPITAL_COMMUNITY)
Admission: AD | Admit: 2018-04-30 | Discharge: 2018-04-30 | Disposition: A | Discharge status: Home / Self Care | Payer: Medicaid Other | Source: Ambulatory Visit | Attending: Family Medicine | Admitting: Family Medicine

## 2018-04-30 ENCOUNTER — Other Ambulatory Visit: Payer: Self-pay

## 2018-04-30 ENCOUNTER — Encounter (HOSPITAL_COMMUNITY): Payer: Self-pay | Admitting: *Deleted

## 2018-04-30 DIAGNOSIS — O9989 Other specified diseases and conditions complicating pregnancy, childbirth and the puerperium: Secondary | ICD-10-CM | POA: Diagnosis present

## 2018-04-30 DIAGNOSIS — O26892 Other specified pregnancy related conditions, second trimester: Secondary | ICD-10-CM

## 2018-04-30 DIAGNOSIS — R102 Pelvic and perineal pain: Secondary | ICD-10-CM

## 2018-04-30 DIAGNOSIS — M549 Dorsalgia, unspecified: Secondary | ICD-10-CM

## 2018-04-30 DIAGNOSIS — Z711 Person with feared health complaint in whom no diagnosis is made: Secondary | ICD-10-CM

## 2018-04-30 DIAGNOSIS — Z809 Family history of malignant neoplasm, unspecified: Secondary | ICD-10-CM | POA: Diagnosis not present

## 2018-04-30 DIAGNOSIS — Z3A14 14 weeks gestation of pregnancy: Secondary | ICD-10-CM

## 2018-04-30 DIAGNOSIS — M5430 Sciatica, unspecified side: Secondary | ICD-10-CM | POA: Diagnosis not present

## 2018-04-30 DIAGNOSIS — O26893 Other specified pregnancy related conditions, third trimester: Secondary | ICD-10-CM

## 2018-04-30 DIAGNOSIS — O26899 Other specified pregnancy related conditions, unspecified trimester: Secondary | ICD-10-CM

## 2018-04-30 DIAGNOSIS — O99891 Other specified diseases and conditions complicating pregnancy: Secondary | ICD-10-CM

## 2018-04-30 DIAGNOSIS — Z3A34 34 weeks gestation of pregnancy: Secondary | ICD-10-CM | POA: Insufficient documentation

## 2018-04-30 LAB — URINALYSIS, ROUTINE W REFLEX MICROSCOPIC
Bilirubin Urine: NEGATIVE
Glucose, UA: NEGATIVE mg/dL
Hgb urine dipstick: NEGATIVE
Ketones, ur: NEGATIVE mg/dL
Leukocytes,Ua: NEGATIVE
Nitrite: NEGATIVE
Protein, ur: NEGATIVE mg/dL
Specific Gravity, Urine: 1.006 (ref 1.005–1.030)
pH: 7 (ref 5.0–8.0)

## 2018-04-30 NOTE — Discharge Instructions (Signed)
Third Trimester of Pregnancy  The third trimester is from week 28 through week 40 (months 7 through 9). This trimester is when your unborn baby (fetus) is growing very fast. At the end of the ninth month, the unborn baby is about 20 inches in length. It weighs about 6-10 pounds. Follow these instructions at home: Medicines  Take over-the-counter and prescription medicines only as told by your doctor. Some medicines are safe and some medicines are not safe during pregnancy.  Take a prenatal vitamin that contains at least 600 micrograms (mcg) of folic acid.  If you have trouble pooping (constipation), take medicine that will make your stool soft (stool softener) if your doctor approves. Eating and drinking   Eat regular, healthy meals.  Avoid raw meat and uncooked cheese.  If you get low calcium from the food you eat, talk to your doctor about taking a daily calcium supplement.  Eat four or five small meals rather than three large meals a day.  Avoid foods that are high in fat and sugars, such as fried and sweet foods.  To prevent constipation: ? Eat foods that are high in fiber, like fresh fruits and vegetables, whole grains, and beans. ? Drink enough fluids to keep your pee (urine) clear or pale yellow. Activity  Exercise only as told by your doctor. Stop exercising if you start to have cramps.  Avoid heavy lifting, wear low heels, and sit up straight.  Do not exercise if it is too hot, too humid, or if you are in a place of great height (high altitude).  You may continue to have sex unless your doctor tells you not to. Relieving pain and discomfort  Wear a good support bra if your breasts are tender.  Take frequent breaks and rest with your legs raised if you have leg cramps or low back pain.  Take warm water baths (sitz baths) to soothe pain or discomfort caused by hemorrhoids. Use hemorrhoid cream if your doctor approves.  If you develop puffy, bulging veins (varicose  veins) in your legs: ? Wear support hose or compression stockings as told by your doctor. ? Raise (elevate) your feet for 15 minutes, 3-4 times a day. ? Limit salt in your food. Safety  Wear your seat belt when driving.  Make a list of emergency phone numbers, including numbers for family, friends, the hospital, and police and fire departments. Preparing for your baby's arrival To prepare for the arrival of your baby:  Take prenatal classes.  Practice driving to the hospital.  Visit the hospital and tour the maternity area.  Talk to your work about taking leave once the baby comes.  Pack your hospital bag.  Prepare the baby's room.  Go to your doctor visits.  Buy a rear-facing car seat. Learn how to install it in your car. General instructions  Do not use hot tubs, steam rooms, or saunas.  Do not use any products that contain nicotine or tobacco, such as cigarettes and e-cigarettes. If you need help quitting, ask your doctor.  Do not drink alcohol.  Do not douche or use tampons or scented sanitary pads.  Do not cross your legs for long periods of time.  Do not travel for long distances unless you must. Only do so if your doctor says it is okay.  Visit your dentist if you have not gone during your pregnancy. Use a soft toothbrush to brush your teeth. Be gentle when you floss.  Avoid cat litter boxes and soil  used by cats. These carry germs that can cause birth defects in the baby and can cause a loss of your baby (miscarriage) or stillbirth.  Keep all your prenatal visits as told by your doctor. This is important. Contact a doctor if:  You are not sure if you are in labor or if your water has broken.  You are dizzy.  You have mild cramps or pressure in your lower belly.  You have a nagging pain in your belly area.  You continue to feel sick to your stomach, you throw up, or you have watery poop.  You have bad smelling fluid coming from your vagina.  You have  pain when you pee. Get help right away if:  You have a fever.  You are leaking fluid from your vagina.  You are spotting or bleeding from your vagina.  You have severe belly cramps or pain.  You lose or gain weight quickly.  You have trouble catching your breath and have chest pain.  You notice sudden or extreme puffiness (swelling) of your face, hands, ankles, feet, or legs.  You have not felt the baby move in over an hour.  You have severe headaches that do not go away with medicine.  You have trouble seeing.  You are leaking, or you are having a gush of fluid, from your vagina before you are 37 weeks.  You have regular belly spasms (contractions) before you are 37 weeks. Summary  The third trimester is from week 28 through week 40 (months 7 through 9). This time is when your unborn baby is growing very fast.  Follow your doctor's advice about medicine, food, and activity.  Get ready for the arrival of your baby by taking prenatal classes, getting all the baby items ready, preparing the baby's room, and visiting your doctor to be checked.  Get help right away if you are bleeding from your vagina, or you have chest pain and trouble catching your breath, or if you have not felt your baby move in over an hour. This information is not intended to replace advice given to you by your health care provider. Make sure you discuss any questions you have with your health care provider. Document Released: 03/27/2009 Document Revised: 02/06/2016 Document Reviewed: 02/06/2016 Elsevier Interactive Patient Education  2019 [employer] Back Pain in Pregnancy Back pain during pregnancy is common. Back pain may be caused by several factors that are related to changes during your pregnancy. Follow these instructions at home: Managing pain, stiffness, and swelling      If directed, for sudden (acute) back pain, put ice on the painful area. ? Put ice in a plastic bag. ? Place a towel  between your skin and the bag. ? Leave the ice on for 20 minutes, 2-3 times per day.  If directed, apply heat to the affected area before you exercise. Use the heat source that your health care provider recommends, such as a moist heat pack or a heating pad. ? Place a towel between your skin and the heat source. ? Leave the heat on for 20-30 minutes. ? Remove the heat if your skin turns bright red. This is especially important if you are unable to feel pain, heat, or cold. You may have a greater risk of getting burned.  If directed, massage the affected area. Activity  Exercise as told by your health care provider. Gentle exercise is the best way to prevent or manage back pain.  Listen to your body when  lifting. If lifting hurts, ask for help or bend your knees. This uses your leg muscles instead of your back muscles.  Squat down when picking up something from the floor. Do not bend over.  Only use bed rest for short periods as told by your health care provider. Bed rest should only be used for the most severe episodes of back pain. Standing, sitting, and lying down  Do not stand in one place for long periods of time.  Use good posture when sitting. Make sure your head rests over your shoulders and is not hanging forward. Use a pillow on your lower back if necessary.  Try sleeping on your side, preferably the left side, with a pregnancy support pillow or 1-2 regular pillows between your legs. ? If you have back pain after a night's rest, your bed may be too soft. ? A firm mattress may provide more support for your back during pregnancy. General instructions  Do not wear high heels.  Eat a healthy diet. Try to gain weight within your health care provider's recommendations.  Use a maternity girdle, elastic sling, or back brace as told by your health care provider.  Take over-the-counter and prescription medicines only as told by your health care provider.  Work with a physical  therapist or massage therapist to find ways to manage back pain. Acupuncture or massage therapy may be helpful.  Keep all follow-up visits as told by your health care provider. This is important. Contact a health care provider if:  Your back pain interferes with your daily activities.  You have increasing pain in other parts of your body. Get help right away if:  You develop numbness, tingling, weakness, or problems with the use of your arms or legs.  You develop severe back pain that is not controlled with medicine.  You have a change in bowel or bladder control.  You develop shortness of breath, dizziness, or you faint.  You develop nausea, vomiting, or sweating.  You have back pain that is a rhythmic, cramping pain similar to labor pains. Labor pain is usually 1-2 minutes apart, lasts for about 1 minute, and involves a bearing down feeling or pressure in your pelvis.  You have back pain and your water breaks or you have vaginal bleeding.  You have back pain or numbness that travels down your leg.  Your back pain developed after you fell.  You develop pain on one side of your back.  You see blood in your urine.  You develop skin blisters in the area of your back pain. Summary  Back pain may be caused by several factors that are related to changes during your pregnancy.  Follow instructions as told by your health care provider for managing pain, stiffness, and swelling.  Exercise as told by your health care provider. Gentle exercise is the best way to prevent or manage back pain.  Take over-the-counter and prescription medicines only as told by your health care provider.  Keep all follow-up visits as told by your health care provider. This is important. This information is not intended to replace advice given to you by your health care provider. Make sure you discuss any questions you have with your health care provider. Document Released: 04/10/2005 Document Revised:  06/18/2017 Document Reviewed: 06/18/2017 Elsevier Interactive Patient Education  2019 [employer] Sciatica  Sciatica is pain, numbness, weakness, or tingling along the path of the sciatic nerve. The sciatic nerve starts in the lower back and runs down the back  of each leg. The nerve controls the muscles in the lower leg and in the back of the knee. It also provides feeling (sensation) to the back of the thigh, the lower leg, and the sole of the foot. Sciatica is a symptom of another medical condition that pinches or puts pressure on the sciatic nerve. Generally, sciatica only affects one side of the body. Sciatica usually goes away on its own or with treatment. In some cases, sciatica may keep coming back (recur). What are the causes? This condition is caused by pressure on the sciatic nerve, or pinching of the sciatic nerve. This may be the result of:  A disk in between the bones of the spine (vertebrae) bulging out too far (herniated disk).  Age-related changes in the spinal disks (degenerative disk disease).  A pain disorder that affects a muscle in the buttock (piriformis syndrome).  Extra bone growth (bone spur) near the sciatic nerve.  An injury or break (fracture) of the pelvis.  Pregnancy.  Tumor (rare). What increases the risk? The following factors may make you more likely to develop this condition:  Playing sports that place pressure or stress on the spine, such as football or weight lifting.  Having poor strength and flexibility.  A history of back injury.  A history of back surgery.  Sitting for long periods of time.  Doing activities that involve repetitive bending or lifting.  Obesity. What are the signs or symptoms? Symptoms can vary from mild to very severe, and they may include:  Any of these problems in the lower back, leg, hip, or buttock: ? Mild tingling or dull aches. ? Burning sensations. ? Sharp pains.  Numbness in the back of the calf or the  sole of the foot.  Leg weakness.  Severe back pain that makes movement difficult. These symptoms may get worse when you cough, sneeze, or laugh, or when you sit or stand for long periods of time. Being overweight may also make symptoms worse. In some cases, symptoms may recur over time. How is this diagnosed? This condition may be diagnosed based on:  Your symptoms.  A physical exam. Your health care provider may ask you to do certain movements to check whether those movements trigger your symptoms.  You may have tests, including: ? Blood tests. ? X-rays. ? MRI. ? CT scan. How is this treated? In many cases, this condition improves on its own, without any treatment. However, treatment may include:  Reducing or modifying physical activity during periods of pain.  Exercising and stretching to strengthen your abdomen and improve the flexibility of your spine.  Icing and applying heat to the affected area.  Medicines that help: ? To relieve pain and swelling. ? To relax your muscles.  Injections of medicines that help to relieve pain, irritation, and inflammation around the sciatic nerve (steroids).  Surgery. Follow these instructions at home: Medicines  Take over-the-counter and prescription medicines only as told by your health care provider.  Do not drive or operate heavy machinery while taking prescription pain medicine. Managing pain  If directed, apply ice to the affected area. ? Put ice in a plastic bag. ? Place a towel between your skin and the bag. ? Leave the ice on for 20 minutes, 2-3 times a day.  After icing, apply heat to the affected area before you exercise or as often as told by your health care provider. Use the heat source that your health care provider recommends, such as a moist heat  pack or a heating pad. ? Place a towel between your skin and the heat source. ? Leave the heat on for 20-30 minutes. ? Remove the heat if your skin turns bright red.  This is especially important if you are unable to feel pain, heat, or cold. You may have a greater risk of getting burned. Activity  Return to your normal activities as told by your health care provider. Ask your health care provider what activities are safe for you. ? Avoid activities that make your symptoms worse.  Take brief periods of rest throughout the day. Resting in a lying or standing position is usually better than sitting to rest. ? When you rest for longer periods, mix in some mild activity or stretching between periods of rest. This will help to prevent stiffness and pain. ? Avoid sitting for long periods of time without moving. Get up and move around at least one time each hour.  Exercise and stretch regularly, as told by your health care provider.  Do not lift anything that is heavier than 10 lb (4.5 kg) while you have symptoms of sciatica. When you do not have symptoms, you should still avoid heavy lifting, especially repetitive heavy lifting.  When you lift objects, always use proper lifting technique, which includes: ? Bending your knees. ? Keeping the load close to your body. ? Avoiding twisting. General instructions  Use good posture. ? Avoid leaning forward while sitting. ? Avoid hunching over while standing.  Maintain a healthy weight. Excess weight puts extra stress on your back and makes it difficult to maintain good posture.  Wear supportive, comfortable shoes. Avoid wearing high heels.  Avoid sleeping on a mattress that is too soft or too hard. A mattress that is firm enough to support your back when you sleep may help to reduce your pain.  Keep all follow-up visits as told by your health care provider. This is important. Contact a health care provider if:  You have pain that wakes you up when you are sleeping.  You have pain that gets worse when you lie down.  Your pain is worse than you have experienced in the past.  Your pain lasts longer than 4  weeks.  You experience unexplained weight loss. Get help right away if:  You lose control of your bowel or bladder (incontinence).  You have: ? Weakness in your lower back, pelvis, buttocks, or legs that gets worse. ? Redness or swelling of your back. ? A burning sensation when you urinate. This information is not intended to replace advice given to you by your health care provider. Make sure you discuss any questions you have with your health care provider. Document Released: 12/25/2000 Document Revised: 06/06/2015 Document Reviewed: 09/09/2014 Elsevier Interactive Patient Education  2019 [employer].

## 2018-04-30 NOTE — MAU Note (Signed)
Pt reports to MAU c/o vaginal pressure and is requesting a cervical exam or a [place] because she has not had a appt since March. Pt reports it is hard to walk at times and reports SOB only when she feels the severe pelvic pressure. She is not SOB when she is comfortable. Pt reports +FM. No bleeding or LOF. Pt reports no ctx.

## 2018-04-30 NOTE — MAU Note (Signed)
Pt reports she fell on Saturday. Pt denies hitting her abdomen but states she kind of fell into a split and hit her butt.
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
## 2018-05-06 ENCOUNTER — Other Ambulatory Visit: Payer: Self-pay

## 2018-05-06 ENCOUNTER — Other Ambulatory Visit: Payer: Self-pay | Admitting: Obstetrics

## 2018-05-06 ENCOUNTER — Encounter (HOSPITAL_COMMUNITY): Payer: Self-pay | Admitting: *Deleted

## 2018-05-06 ENCOUNTER — Inpatient Hospital Stay (HOSPITAL_COMMUNITY)
Admission: AD | Admit: 2018-05-06 | Discharge: 2018-05-06 | Disposition: A | Discharge status: Home / Self Care | Payer: Medicaid Other | Attending: Obstetrics and Gynecology | Admitting: Obstetrics and Gynecology

## 2018-05-06 ENCOUNTER — Telehealth: Payer: Self-pay | Admitting: *Deleted

## 2018-05-06 DIAGNOSIS — Z3A35 35 weeks gestation of pregnancy: Secondary | ICD-10-CM | POA: Diagnosis not present

## 2018-05-06 DIAGNOSIS — Z0371 Encounter for suspected problem with amniotic cavity and membrane ruled out: Secondary | ICD-10-CM

## 2018-05-06 LAB — POCT FERN TEST: POCT Fern Test: NEGATIVE

## 2018-05-06 NOTE — MAU Provider Note (Signed)
First Provider Initiated Contact with Patient 05/06/18 [phone]       S: Ms. Helen Curtis is a 28 y.o. G3P1011 at [redacted]w[redacted]d  who presents to MAU today complaining of leaking of fluid since 7 am. She denies vaginal bleeding. She denies contractions. She reports normal fetal movement.    O: BP (!) 116/53   Pulse 94   Temp 98.4 F (36.9 C)   Resp 18   Ht 5\' 7"  (1.702 m)   Wt 108 kg   LMP 08/29/2017   SpO2 100%   BMI 37.28 kg/m  GENERAL: Well-developed, well-nourished female in no acute distress.  HEAD: Normocephalic, atraumatic.  CHEST: Normal effort of breathing, regular heart rate ABDOMEN: Soft, nontender, gravid PELVIC: Normal external female genitalia. Vagina is pink and rugated. Cervix with normal contour, no lesions. Normal discharge.  No pooling.   Cervical exam: visually closed  Fetal Monitoring: Baseline: 140 Variability: moderate Accelerations: 15x15 Decelerations: none   Results for orders placed or performed during the hospital encounter of 05/06/18 (from the past 24 hour(s))  POCT fern test     Status: None   Collection Time: 05/06/18  9:31 AM  Result Value Ref Range   POCT Fern Test Negative = intact amniotic membranes      A: [facility] at [redacted]w[redacted]d  Membranes intact  P: Discharge home  Discussed reasons to return to MAU  Judeth Horn, NP 05/06/2018 9:52 AM

## 2018-05-06 NOTE — MAU Note (Signed)
Pt presents to MAU with complaints of rupture of membranes around 7 this morning. Irregular contractions

## 2018-05-06 NOTE — Telephone Encounter (Signed)
Pt called to office stating she thinks her water has broke today. Pt advised to be seen at hospital for eval.  Pt states understanding.

## 2018-05-06 NOTE — Discharge Instructions (Signed)
Fetal Movement Counts °Patient Name: ________________________________________________ Patient Due Date: ____________________ °What is a fetal movement count? ° °A fetal movement count is the number of times that you feel your baby move during a certain amount of time. This may also be called a fetal kick count. A fetal movement count is recommended for every pregnant woman. You may be asked to start counting fetal movements as early as week 28 of your pregnancy. °Pay attention to when your baby is most active. You may notice your baby's sleep and wake cycles. You may also notice things that make your baby move more. You should do a fetal movement count: °· When your baby is normally most active. °· At the same time each day. °A good time to count movements is while you are resting, after having something to eat and drink. °How do I count fetal movements? °1. Find a quiet, comfortable area. Sit, or lie down on your side. °2. Write down the date, the start time and stop time, and the number of movements that you felt between those two times. Take this information with you to your health care visits. °3. For 2 hours, count kicks, flutters, swishes, rolls, and jabs. You should feel at least 10 movements during 2 hours. °4. You may stop counting after you have felt 10 movements. °5. If you do not feel 10 movements in 2 hours, have something to eat and drink. Then, keep resting and counting for 1 hour. If you feel at least 4 movements during that hour, you may stop counting. °Contact a health care provider if: °· You feel fewer than 4 movements in 2 hours. °· Your baby is not moving like he or she usually does. °Date: ____________ Start time: ____________ Stop time: ____________ Movements: ____________ °Date: ____________ Start time: ____________ Stop time: ____________ Movements: ____________ °Date: ____________ Start time: ____________ Stop time: ____________ Movements: ____________ °Date: ____________ Start time:  ____________ Stop time: ____________ Movements: ____________ °Date: ____________ Start time: ____________ Stop time: ____________ Movements: ____________ °Date: ____________ Start time: ____________ Stop time: ____________ Movements: ____________ °Date: ____________ Start time: ____________ Stop time: ____________ Movements: ____________ °Date: ____________ Start time: ____________ Stop time: ____________ Movements: ____________ °Date: ____________ Start time: ____________ Stop time: ____________ Movements: ____________ °This information is not intended to replace advice given to you by your health care provider. Make sure you discuss any questions you have with your health care provider. °Document Released: 01/30/2006 Document Revised: 08/30/2015 Document Reviewed: 02/09/2015 °Elsevier Interactive Patient Education © 2019 [employer] °Signs and Symptoms of Labor °Labor is your body's natural process of moving your baby, placenta, and umbilical cord out of your uterus. The process of labor usually starts when your baby is full-term, between 37 and 40 weeks of pregnancy. °How will I know when I am close to going into labor? °As your body prepares for labor and the birth of your baby, you may notice the following symptoms in the weeks and days before true labor starts: °· Having a strong desire to get your home ready to receive your new baby. This is called nesting. Nesting may be a sign that labor is approaching, and it may occur several weeks before birth. Nesting may involve cleaning and organizing your home. °· Passing a small amount of thick, bloody mucus out of your vagina (normal bloody show or losing your mucus plug). This may happen more than a week before labor begins, or it might occur right before labor begins as the opening of the cervix   starts to widen (dilate). For some women, the entire mucus plug passes at once. For others, smaller portions of the mucus plug may gradually pass over several  days. °· Your baby moving (dropping) lower in your pelvis to get into position for birth (lightening). When this happens, you may feel more pressure on your bladder and pelvic bone and less pressure on your ribs. This may make it easier to breathe. It may also cause you to need to urinate more often and have problems with bowel movements. °· Having "practice contractions" (Braxton Hicks contractions) that occur at irregular (unevenly spaced) intervals that are more than 10 minutes apart. This is also called false labor. False labor contractions are common after exercise or sexual activity, and they will stop if you change position, rest, or drink fluids. These contractions are usually mild and do not get stronger over time. They may feel like: °? A backache or back pain. °? Mild cramps, similar to menstrual cramps. °? Tightening or pressure in your abdomen. °Other early symptoms that labor may be starting soon include: °· Nausea or loss of appetite. °· Diarrhea. °· Having a sudden burst of energy, or feeling very tired. °· Mood changes. °· Having trouble sleeping. °How will I know when labor has begun? °Signs that true labor has begun may include: °· Having contractions that come at regular (evenly spaced) intervals and increase in intensity. This may feel like more intense tightening or pressure in your abdomen that moves to your back. °? Contractions may also feel like rhythmic pain in your upper thighs or back that comes and goes at regular intervals. °? For first-time mothers, this change in intensity of contractions often occurs at a more gradual pace. °? Women who have given birth before may notice a more rapid progression of contraction changes. °· Having a feeling of pressure in the vaginal area. °· Your water breaking (rupture of membranes). This is when the sac of fluid that surrounds your baby breaks. When this happens, you will notice fluid leaking from your vagina. This may be clear or blood-tinged.  Labor usually starts within 24 hours of your water breaking, but it may take longer to begin. °? Some women notice this as a gush of fluid. °? Others notice that their underwear repeatedly becomes damp. °Follow these instructions at home: ° °· When labor starts, or if your water breaks, call your health care provider or nurse care line. Based on your situation, they will determine when you should go in for an exam. °· When you are in early labor, you may be able to rest and manage symptoms at home. Some strategies to try at home include: °? Breathing and relaxation techniques. °? Taking a warm bath or shower. °? Listening to music. °? Using a heating pad on the lower back for pain. If you are directed to use heat: °§ Place a towel between your skin and the heat source. °§ Leave the heat on for 20-30 minutes. °§ Remove the heat if your skin turns bright red. This is especially important if you are unable to feel pain, heat, or cold. You may have a greater risk of getting burned. °Get help right away if: °· You have painful, regular contractions that are 5 minutes apart or less. °· Labor starts before you are [redacted] weeks along in your pregnancy. °· You have a fever. °· You have a headache that does not go away. °· You have bright red blood coming from your vagina. °·   You do not feel your baby moving. °· You have a sudden onset of: °? Severe headache with vision problems. °? Nausea, vomiting, or diarrhea. °? Chest pain or shortness of breath. °These symptoms may be an emergency. If your health care provider recommends that you go to the hospital or birth center where you plan to deliver, do not drive yourself. Have someone else drive you, or call emergency services (911 in the [place] °Summary °· Labor is your body's natural process of moving your baby, placenta, and umbilical cord out of your uterus. °· The process of labor usually starts when your baby is full-term, between 37 and 40 weeks of pregnancy. °· When labor  starts, or if your water breaks, call your health care provider or nurse care line. Based on your situation, they will determine when you should go in for an exam. °This information is not intended to replace advice given to you by your health care provider. Make sure you discuss any questions you have with your health care provider. °Document Released: 06/07/2016 Document Revised: 06/07/2016 Document Reviewed: 06/07/2016 °Elsevier Interactive Patient Education © 2019 Elsevier Inc. ° °

## 2018-05-13 ENCOUNTER — Ambulatory Visit (INDEPENDENT_AMBULATORY_CARE_PROVIDER_SITE_OTHER): Payer: Medicaid Other | Admitting: Obstetrics & Gynecology

## 2018-05-13 ENCOUNTER — Other Ambulatory Visit (HOSPITAL_COMMUNITY)
Admission: RE | Admit: 2018-05-13 | Discharge: 2018-05-13 | Disposition: A | Discharge status: Home / Self Care | Payer: Medicaid Other | Source: Ambulatory Visit | Attending: Obstetrics & Gynecology | Admitting: Obstetrics & Gynecology

## 2018-05-13 ENCOUNTER — Other Ambulatory Visit: Payer: Self-pay

## 2018-05-13 VITALS — BP 109/69 | HR 94 | Wt 238.8 lb

## 2018-05-13 DIAGNOSIS — Z3483 Encounter for supervision of other normal pregnancy, third trimester: Secondary | ICD-10-CM | POA: Insufficient documentation

## 2018-05-13 DIAGNOSIS — Z3A36 36 weeks gestation of pregnancy: Secondary | ICD-10-CM

## 2018-05-13 NOTE — Progress Notes (Signed)
   PRENATAL VISIT NOTE  Subjective:  Helen Curtis is a 28 y.o. G3P1011 at [redacted]w[redacted]d being seen today for ongoing prenatal care.  She is currently monitored for the following issues for this low-risk pregnancy and has Obesity; Supervision of normal pregnancy; and Back pain affecting pregnancy in third trimester on their problem list.  Patient reports no complaints.  Contractions: Irritability. Vag. Bleeding: None.  Movement: Present. Denies leaking of fluid.   The following portions of the patient's history were reviewed and updated as appropriate: allergies, current medications, past family history, past medical history, past social history, past surgical history and problem list.   Objective:   Vitals:   05/13/18 1118  BP: 109/69  Pulse: 94  Weight: 238 lb 12.8 oz (108.3 kg)    Fetal Status: Fetal Heart Rate (bpm): 142   Movement: Present     General:  Alert, oriented and cooperative. Patient is in no acute distress.  Skin: Skin is warm and dry. No rash noted.   Cardiovascular: Normal heart rate noted  Respiratory: Normal respiratory effort, no problems with respiration noted  Abdomen: Soft, gravid, appropriate for gestational age.  Pain/Pressure: Present     Pelvic: Cervical exam performed        Extremities: Normal range of motion.  Edema: Trace  Mental Status: Normal mood and affect. Normal behavior. Normal judgment and thought content.   Assessment and Plan:  Pregnancy: G3P1011 at [redacted]w[redacted]d 1. Encounter for supervision of other normal pregnancy in third trimester 36 week labs, S>D - Strep Gp B NAA - GC/Chlamydia probe amp ()not at [facility] Babyscripts Schedule Optimization - [place] MFM OB FOLLOW UP; Future  Preterm labor symptoms and general obstetric precautions including but not limited to vaginal bleeding, contractions, leaking of fluid and fetal movement were reviewed in detail with the patient. Please refer to After Visit Summary for other counseling  recommendations.   Return in about 1 week (around 05/20/2018).  Future Appointments  Date Time Provider Department Center  05/20/2018  3:00 PM Constant, Gigi Gin, MD [facility] None    Scheryl Darter, MD

## 2018-05-13 NOTE — Progress Notes (Signed)
Patient reports fetal movement with pressure and uterine irritability.

## 2018-05-13 NOTE — Patient Instructions (Signed)

## 2018-05-14 LAB — GC/CHLAMYDIA PROBE AMP (~~LOC~~) NOT AT ARMC
Chlamydia: NEGATIVE
Neisseria Gonorrhea: NEGATIVE

## 2018-05-15 LAB — STREP GP B NAA: Strep Gp B NAA: POSITIVE — AB

## 2018-05-20 ENCOUNTER — Ambulatory Visit (INDEPENDENT_AMBULATORY_CARE_PROVIDER_SITE_OTHER): Payer: Medicaid Other | Admitting: Obstetrics

## 2018-05-20 ENCOUNTER — Other Ambulatory Visit: Payer: Self-pay

## 2018-05-20 ENCOUNTER — Encounter: Payer: Self-pay | Admitting: Obstetrics

## 2018-05-20 DIAGNOSIS — Z3483 Encounter for supervision of other normal pregnancy, third trimester: Secondary | ICD-10-CM

## 2018-05-20 DIAGNOSIS — Z3A37 37 weeks gestation of pregnancy: Secondary | ICD-10-CM

## 2018-05-20 MED ORDER — BLOOD PRESSURE MONITOR KIT: 1.0000 | PACK | 0 refills | Status: DC

## 2018-05-20 NOTE — Progress Notes (Signed)
   TELEHEALTH VIRTUAL OBSTETRICS PRENATAL VISIT ENCOUNTER NOTE  I connected with Helen Curtis on 05/20/18 at  3:00 PM EDT by WebEx at home and verified that I am speaking with the correct person using two identifiers.   I discussed the limitations, risks, security and privacy concerns of performing an evaluation and management service by telephone and the availability of in person appointments. I also discussed with the patient that there may be a patient responsible charge related to this service. The patient expressed understanding and agreed to proceed. Subjective:  Helen Curtis is a 28 y.o. G3P1011 at [redacted]w[redacted]d being seen today for ongoing prenatal care.  She is currently monitored for the following issues for this low-risk pregnancy and has Obesity; Supervision of normal pregnancy; and Back pain affecting pregnancy in third trimester on their problem list.  Patient reports no complaints.  Reports fetal movement. Contractions: Irritability. Vag. Bleeding: None.  Movement: Present. Denies any contractions, bleeding or leaking of fluid.   The following portions of the patient's history were reviewed and updated as appropriate: allergies, current medications, past family history, past medical history, past social history, past surgical history and problem list.   Objective:  There were no vitals filed for this visit.  Fetal Status:     Movement: Present     General:  Alert, oriented and cooperative. Patient is in no acute distress.  Respiratory: Normal respiratory effort, no problems with respiration noted  Mental Status: Normal mood and affect. Normal behavior. Normal judgment and thought content.  Rest of physical exam deferred due to type of encounter  Assessment and Plan:  Pregnancy: G3P1011 at [redacted]w[redacted]d 1. Encounter for supervision of other normal pregnancy in third trimester   Term labor symptoms and general obstetric precautions including but not limited to vaginal bleeding,  contractions, leaking of fluid and fetal movement were reviewed in detail with the patient. I discussed the assessment and treatment plan with the patient. The patient was provided an opportunity to ask questions and all were answered. The patient agreed with the plan and demonstrated an understanding of the instructions. The patient was advised to call back or seek an in-person office evaluation/go to MAU at [facility] for any urgent or concerning symptoms. Please refer to After Visit Summary for other counseling recommendations.   I provided 10 minutes of face-to-face via WebEx time during this encounter.  Return in about 1 week (around 05/27/2018) [facility].  Future Appointments  Date Time Provider Department Center  05/21/2018  3:15 PM [facility] [place] 4 [facility]    Coral Ceo, MD Center for [facility], [facility] 05-20-2018

## 2018-05-20 NOTE — Progress Notes (Signed)
Pt presents for webex visit. Pt identified with two pt identifiers. Pt is [redacted]w[redacted]d. Pt states that she has not been monitoring her bp due to not being able to locate her cuff. Pt does not have any concerns.

## 2018-05-21 ENCOUNTER — Ambulatory Visit (HOSPITAL_COMMUNITY)
Admission: RE | Admit: 2018-05-21 | Discharge: 2018-05-21 | Disposition: A | Discharge status: Home / Self Care | Payer: Medicaid Other | Source: Ambulatory Visit | Attending: Obstetrics and Gynecology | Admitting: Obstetrics and Gynecology

## 2018-05-21 DIAGNOSIS — Z362 Encounter for other antenatal screening follow-up: Secondary | ICD-10-CM

## 2018-05-21 DIAGNOSIS — Z3A37 37 weeks gestation of pregnancy: Secondary | ICD-10-CM | POA: Diagnosis not present

## 2018-05-21 DIAGNOSIS — O26843 Uterine size-date discrepancy, third trimester: Secondary | ICD-10-CM | POA: Diagnosis not present

## 2018-05-21 DIAGNOSIS — Z3483 Encounter for supervision of other normal pregnancy, third trimester: Secondary | ICD-10-CM | POA: Diagnosis present

## 2018-05-27 ENCOUNTER — Telehealth: Payer: Self-pay | Admitting: *Deleted

## 2018-05-27 ENCOUNTER — Encounter: Payer: Self-pay | Admitting: Obstetrics

## 2018-05-27 ENCOUNTER — Other Ambulatory Visit: Payer: Self-pay

## 2018-05-27 ENCOUNTER — Ambulatory Visit (INDEPENDENT_AMBULATORY_CARE_PROVIDER_SITE_OTHER): Payer: Medicaid Other | Admitting: Obstetrics

## 2018-05-27 DIAGNOSIS — Z3A38 38 weeks gestation of pregnancy: Secondary | ICD-10-CM

## 2018-05-27 DIAGNOSIS — Z3483 Encounter for supervision of other normal pregnancy, third trimester: Secondary | ICD-10-CM

## 2018-05-27 NOTE — Progress Notes (Signed)
   TELEHEALTH VIRTUAL OBSTETRICS PRENATAL VISIT ENCOUNTER NOTE  I connected with Helen Curtis on 05/27/18 at  2:00 PM EDT by WebEx at home and verified that I am speaking with the correct person using two identifiers.   I discussed the limitations, risks, security and privacy concerns of performing an evaluation and management service by telephone and the availability of in person appointments. I also discussed with the patient that there may be a patient responsible charge related to this service. The patient expressed understanding and agreed to proceed. Subjective:  Helen Curtis is a 28 y.o. G3P1011 at [redacted]w[redacted]d being seen today for ongoing prenatal care.  She is currently monitored for the following issues for this low-risk pregnancy and has Obesity; Supervision of normal pregnancy; and Back pain affecting pregnancy in third trimester on their problem list.  Patient reports no complaints.  Reports fetal movement. Contractions: Not present. Vag. Bleeding: None.  Movement: Present. Denies any contractions, bleeding or leaking of fluid.   The following portions of the patient's history were reviewed and updated as appropriate: allergies, current medications, past family history, past medical history, past social history, past surgical history and problem list.   Objective:  There were no vitals filed for this visit.  Fetal Status:     Movement: Present     General:  Alert, oriented and cooperative. Patient is in no acute distress.  Respiratory: Normal respiratory effort, no problems with respiration noted  Mental Status: Normal mood and affect. Normal behavior. Normal judgment and thought content.  Rest of physical exam deferred due to type of encounter  Assessment and Plan:  Pregnancy: G3P1011 at [redacted]w[redacted]d 1. Encounter for supervision of other normal pregnancy in third trimester   Term labor symptoms and general obstetric precautions including but not limited to vaginal bleeding,  contractions, leaking of fluid and fetal movement were reviewed in detail with the patient. I discussed the assessment and treatment plan with the patient. The patient was provided an opportunity to ask questions and all were answered. The patient agreed with the plan and demonstrated an understanding of the instructions. The patient was advised to call back or seek an in-person office evaluation/go to MAU at [facility] for any urgent or concerning symptoms. Please refer to After Visit Summary for other counseling recommendations.   I provided 10 minutes of face-to-face via WebEx time during this encounter.  Return in about 1 week (around 06/03/2018) for ROB.  No future appointments.  Coral Ceo, MD Center for [facility], [facility] 05-27-2018

## 2018-05-27 NOTE — Telephone Encounter (Signed)
Pt called to office to ask if she could be delivered earlier than 41 week. Pt made aware that unless she has a medical condition, she will not be scheduled to deliver until 41 weeks.  Pt made aware of home activities that may help to start contractions.    Pt made aware she may discuss at her web visit today with provider.

## 2018-05-27 NOTE — Progress Notes (Signed)
Pt presents for webex visit. Pt identified with two pt identifers. She is currently [redacted]w[redacted]d. Pt unable to check bp at this time, but will check and upload to babyrx later when she gets home. Pt has no concerns.

## 2018-06-03 ENCOUNTER — Other Ambulatory Visit: Payer: Self-pay

## 2018-06-03 ENCOUNTER — Encounter (HOSPITAL_COMMUNITY): Payer: Self-pay | Admitting: *Deleted

## 2018-06-03 ENCOUNTER — Ambulatory Visit (INDEPENDENT_AMBULATORY_CARE_PROVIDER_SITE_OTHER): Payer: Medicaid Other | Admitting: Obstetrics and Gynecology

## 2018-06-03 ENCOUNTER — Encounter: Payer: Self-pay | Admitting: Obstetrics and Gynecology

## 2018-06-03 ENCOUNTER — Telehealth (HOSPITAL_COMMUNITY): Payer: Self-pay | Admitting: *Deleted

## 2018-06-03 DIAGNOSIS — Z3A39 39 weeks gestation of pregnancy: Secondary | ICD-10-CM

## 2018-06-03 DIAGNOSIS — Z3483 Encounter for supervision of other normal pregnancy, third trimester: Secondary | ICD-10-CM

## 2018-06-03 DIAGNOSIS — O99820 Streptococcus B carrier state complicating pregnancy: Secondary | ICD-10-CM

## 2018-06-03 NOTE — Patient Instructions (Signed)

## 2018-06-03 NOTE — Progress Notes (Signed)
Subjective:  Helen Curtis is a 28 y.o. G3P1011 at [redacted]w[redacted]d being seen today for ongoing prenatal care.  She is currently monitored for the following issues for this low-risk pregnancy and has Obesity; Supervision of normal pregnancy; Back pain affecting pregnancy in third trimester; and GBS (group B Streptococcus carrier), +RV culture, currently pregnant on their problem list.  Patient reports general discomforts of pregnancy.  Contractions: Irritability. Vag. Bleeding: None.  Movement: Present. Denies leaking of fluid.   The following portions of the patient's history were reviewed and updated as appropriate: allergies, current medications, past family history, past medical history, past social history, past surgical history and problem list. Problem list updated.  Objective:   Vitals:   06/03/18 0925  BP: 119/71  Pulse: 99  Temp: 98.2 F (36.8 C)  Weight: 241 lb (109.3 kg)    Fetal Status: Fetal Heart Rate (bpm): 147   Movement: Present     General:  Alert, oriented and cooperative. Patient is in no acute distress.  Skin: Skin is warm and dry. No rash noted.   Cardiovascular: Normal heart rate noted  Respiratory: Normal respiratory effort, no problems with respiration noted  Abdomen: Soft, gravid, appropriate for gestational age. Pain/Pressure: Present     Pelvic:  Cervical exam performed        Extremities: Normal range of motion.  Edema: None  Mental Status: Normal mood and affect. Normal behavior. Normal judgment and thought content.   Urinalysis:      Assessment and Plan:  Pregnancy: G3P1011 at [redacted]w[redacted]d  1. Encounter for supervision of other normal pregnancy in third trimester Stable Labor precautions IOL scheduled for 41 weeks  2. GBS (group B Streptococcus carrier), +RV culture, currently pregnant Tx while in labor  Term labor symptoms and general obstetric precautions including but not limited to vaginal bleeding, contractions, leaking of fluid and fetal movement were  reviewed in detail with the patient. Please refer to After Visit Summary for other counseling recommendations.  Return in about 1 week (around 06/10/2018) for OB visit, face to face, NST.   Hermina Staggers, MD

## 2018-06-03 NOTE — Telephone Encounter (Signed)
Preadmission screen  

## 2018-06-03 NOTE — Progress Notes (Signed)
Pt desires cervix check today.

## 2018-06-09 ENCOUNTER — Telehealth: Payer: Self-pay | Admitting: *Deleted

## 2018-06-09 NOTE — Telephone Encounter (Signed)
Pt called to office with increase in pain and pressure. Pt states she is having some ctx and cramping.  Pt denies LOF or bleeding at this time. Pt made aware that if she is having consistent ctx, q3-5 min that she should be seen at hospital for labor eval.  Pt advised if she has any decrease in movement, LOF or bleeding to be seen at hospital.  Pt made aware she may ctx with irritability at this time and watch for signs of labor.  Pt states she needs to try to get change in Induction time/date as she does not have transportation at current time scheduled.  Pt made aware I will inquire about change to induction.

## 2018-06-11 ENCOUNTER — Other Ambulatory Visit: Payer: Self-pay

## 2018-06-11 ENCOUNTER — Other Ambulatory Visit (HOSPITAL_COMMUNITY)
Admission: RE | Admit: 2018-06-11 | Discharge: 2018-06-11 | Disposition: A | Discharge status: Home / Self Care | Payer: Medicaid Other | Source: Ambulatory Visit | Attending: Obstetrics and Gynecology | Admitting: Obstetrics and Gynecology

## 2018-06-11 ENCOUNTER — Ambulatory Visit (INDEPENDENT_AMBULATORY_CARE_PROVIDER_SITE_OTHER): Payer: Medicaid Other | Admitting: Obstetrics & Gynecology

## 2018-06-11 ENCOUNTER — Encounter: Payer: Self-pay | Admitting: Obstetrics & Gynecology

## 2018-06-11 VITALS — BP 118/67 | HR 99 | Wt 241.0 lb

## 2018-06-11 DIAGNOSIS — Z1159 Encounter for screening for other viral diseases: Secondary | ICD-10-CM | POA: Insufficient documentation

## 2018-06-11 DIAGNOSIS — Z3483 Encounter for supervision of other normal pregnancy, third trimester: Secondary | ICD-10-CM

## 2018-06-11 DIAGNOSIS — Z3A40 40 weeks gestation of pregnancy: Secondary | ICD-10-CM

## 2018-06-11 LAB — SARS CORONAVIRUS 2 BY RT PCR (HOSPITAL ORDER, PERFORMED IN ~~LOC~~ HOSPITAL LAB): SARS Coronavirus 2: NEGATIVE

## 2018-06-11 NOTE — Progress Notes (Signed)
   PRENATAL VISIT NOTE  Subjective:  Helen Curtis is a 28 y.o. G3P1011 at [redacted]w[redacted]d being seen today for ongoing prenatal care.  She is currently monitored for the following issues for this high-risk pregnancy and has Obesity; Supervision of normal pregnancy; Back pain affecting pregnancy in third trimester; and GBS (group B Streptococcus carrier), +RV culture, currently pregnant on their problem list.  Patient reports no complaints.  Contractions: Irritability. Vag. Bleeding: None.  Movement: Present. Denies leaking of fluid.   The following portions of the patient's history were reviewed and updated as appropriate: allergies, current medications, past family history, past medical history, past social history, past surgical history and problem list.   Objective:   Vitals:   06/11/18 1355  BP: 118/67  Pulse: 99  Weight: 241 lb (109.3 kg)    Fetal Status: Fetal Heart Rate (bpm): NST Fundal Height: 40 cm Movement: Present     General:  Alert, oriented and cooperative. Patient is in no acute distress.  Skin: Skin is warm and dry. No rash noted.   Cardiovascular: Normal heart rate noted  Respiratory: Normal respiratory effort, no problems with respiration noted  Abdomen: Soft, gravid, appropriate for gestational age.  Pain/Pressure: Present     Pelvic: Cervical exam deferred        Extremities: Normal range of motion.  Edema: Trace  Mental Status: Normal mood and affect. Normal behavior. Normal judgment and thought content.   Assessment and Plan:  Pregnancy: G3P1011 at [redacted]w[redacted]d 1. Encounter for supervision of other normal pregnancy in third trimester NST reactive - Fetal nonstress test  Term labor symptoms and general obstetric precautions including but not limited to vaginal bleeding, contractions, leaking of fluid and fetal movement were reviewed in detail with the patient. Please refer to After Visit Summary for other counseling recommendations.   No follow-ups on file.  Future  Appointments  Date Time Provider Department Center  06/14/2018  7:00 AM [facility] SCHED ROOM [facility] None    Scheryl Darter, MD

## 2018-06-11 NOTE — MAU Note (Signed)
Swab collected without difficulty. 

## 2018-06-11 NOTE — Patient Instructions (Signed)
Labor Induction    Labor induction is when steps are taken to cause a pregnant woman to begin the labor process. Most women go into labor on their own between 37 weeks and 42 weeks of pregnancy. When this does not happen or when there is a medical need for labor to begin, steps may be taken to induce labor. Labor induction causes a pregnant woman's uterus to contract. It also causes the cervix to soften (ripen), open (dilate), and thin out (efface). Usually, labor is not induced before 39 weeks of pregnancy unless there is a medical reason to do so. Your health care provider will determine if labor induction is needed.  Before inducing labor, your health care provider will consider a number of factors, including:  · Your medical condition and your baby's.  · How many weeks along you are in your pregnancy.  · How mature your baby's lungs are.  · The condition of your cervix.  · The position of your baby.  · The size of your birth canal.  What are some reasons for labor induction?  Labor may be induced if:  · Your health or your baby's health is at risk.  · Your pregnancy is overdue by 1 week or more.  · Your water breaks but labor does not start on its own.  · There is a low amount of amniotic fluid around your baby.  You may also choose (elect) to have labor induced at a certain time. Generally, elective labor induction is done no earlier than 39 weeks of pregnancy.  What methods are used for labor induction?  Methods used for labor induction include:  · Prostaglandin medicine. This medicine starts contractions and causes the cervix to dilate and ripen. It can be taken by mouth (orally) or by being inserted into the vagina (suppository).  · Inserting a small, thin tube (catheter) with a balloon into the vagina and then expanding the balloon with water to dilate the cervix.  · Stripping the membranes. In this method, your health care provider gently separates amniotic sac tissue from the cervix. This causes the  cervix to stretch, which in turn causes the release of a hormone called progesterone. The hormone causes the uterus to contract. This procedure is often done during an office visit, after which you will be sent home to wait for contractions to begin.  · Breaking the water. In this method, your health care provider uses a small instrument to make a small hole in the amniotic sac. This eventually causes the amniotic sac to break. Contractions should begin after a few hours.  · Medicine to trigger or strengthen contractions. This medicine is given through an IV that is inserted into a vein in your arm.  Except for membrane stripping, which can be done in a clinic, labor induction is done in the hospital so that you and your baby can be carefully monitored.  How long does it take for labor to be induced?  The length of time it takes to induce labor depends on how ready your body is for labor. Some inductions can take up to 2-3 days, while others may take less than a day. Induction may take longer if:  · You are induced early in your pregnancy.  · It is your first pregnancy.  · Your cervix is not ready.  What are some risks associated with labor induction?  Some risks associated with labor induction include:  · Changes in fetal heart rate, such as being too   high, too low, or irregular (erratic).  · Failed induction.  · Infection in the mother or the baby.  · Increased risk of having a cesarean delivery.  · Fetal death.  · Breaking off (abruption) of the placenta from the uterus (rare).  · Rupture of the uterus (very rare).  When induction is needed for medical reasons, the benefits of induction generally outweigh the risks.  What are some reasons for not inducing labor?  Labor induction should not be done if:  · Your baby does not tolerate contractions.  · You have had previous surgeries on your uterus, such as a myomectomy, removal of fibroids, or a vertical scar from a previous cesarean delivery.  · Your placenta lies  very low in your uterus and blocks the opening of the cervix (placenta previa).  · Your baby is not in a head-down position.  · The umbilical cord drops down into the birth canal in front of the baby.  · There are unusual circumstances, such as the baby being very early (premature).  · You have had more than 2 previous cesarean deliveries.  Summary  · Labor induction is when steps are taken to cause a pregnant woman to begin the labor process.  · Labor induction causes a pregnant woman's uterus to contract. It also causes the cervix to ripen, dilate, and efface.  · Labor is not induced before 39 weeks of pregnancy unless there is a medical reason to do so.  · When induction is needed for medical reasons, the benefits of induction generally outweigh the risks.  This information is not intended to replace advice given to you by your health care provider. Make sure you discuss any questions you have with your health care provider.  Document Released: 05/22/2006 Document Revised: 02/14/2016 Document Reviewed: 02/14/2016  Elsevier Interactive Patient Education © 2019 [employer]

## 2018-06-12 ENCOUNTER — Other Ambulatory Visit (HOSPITAL_COMMUNITY): Payer: Self-pay | Admitting: *Deleted

## 2018-06-13 ENCOUNTER — Other Ambulatory Visit: Payer: Self-pay | Admitting: Advanced Practice Midwife

## 2018-06-13 ENCOUNTER — Encounter (HOSPITAL_COMMUNITY): Payer: Medicaid Other

## 2018-06-13 NOTE — H&P (Signed)
Helen Curtis is a 28 y.o. female G3P1011 with IUP at [redacted]w[redacted]d presenting for IOL for postdates. [facility]  Prenatal History/Complications:  Term SVD Early TAB     Past Medical History: Past Medical History:  Diagnosis Date  . 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 History of chicken pox   . History of chlamydia   . No pertinent past medical history     Past Surgical History: Past Surgical History:  Procedure Laterality Date  . THERAPEUTIC ABORTION      Obstetrical History: OB History    Gravida  3   Para  1   Term  1   Preterm  0   AB  1   Living  1     SAB  0   TAB  1   Ectopic  0   Multiple  0   Live Births  1           Social History: Social History   Socioeconomic History  . Marital status: Single    Spouse name: Not on file  . Number of children: Not on file  . Years of education: Not on file  . Highest education level: Not on file  Occupational History  . Not on file  Social Needs  . Financial resource strain: Not hard at all  . Food insecurity:    Worry: Never true    Inability: Never true  . Transportation needs:    Medical: No    Non-medical: Not on file  Tobacco Use  . Smoking status: Never Smoker  . Smokeless tobacco: Never Used  Substance and Sexual Activity  . Alcohol use: Not Currently  . Drug use: No  . Sexual activity: Not Currently    Partners: Male    Birth control/protection: None  Lifestyle  . Physical activity:    Days per week: Not on file    Minutes per session: Not on file  . Stress: Very much  Relationships  . Social connections:    Talks on phone: Not on file    Gets together: Not on file    Attends religious service: Not on file    Active member of club or organization: Not on file    Attends meetings of clubs or organizations: Not on file    Relationship status: Not on file  Other Topics Concern  . Not on file  Social History Narrative  . Not on file     Family History: Family History  Problem Relation Age of Onset  . Hypertension Mother   . Seizures Mother   . Hypertension Maternal Grandmother   . Diabetes Maternal Grandmother   . Cancer Paternal Grandmother   . Alzheimer's disease Paternal Grandmother   . Cancer Paternal Grandfather   . Diabetes Maternal Grandfather   . Anesthesia problems Neg Hx     Allergies: No Known Allergies  (Not in a hospital admission)       Review of Systems   Constitutional: Negative for fever and chills Eyes: Negative for visual disturbances Respiratory: Negative for shortness of breath, dyspnea Cardiovascular: Negative for chest pain or palpitations  Gastrointestinal: Negative for abdominal pain, vomiting, diarrhea and constipation.   Genitourinary: Negative for dysuria and urgency Musculoskeletal: Negative for back pain, joint pain, myalgias  Neurological: Negative for dizziness and headaches      Last menstrual period 08/29/2017. General appearance: alert, cooperative and no distress Lungs: normal respiratory effort Heart:  regular rate and rhythm Abdomen: soft, non-tender; bowel sounds normal Extremities: Homans sign is negative, no sign of DVT DTR's 2+ Presentation: cephalic Fetal monitoring  Baseline: 140 bpm, Variability: Good {> 6 bpm), Accelerations: Reactive and Decelerations: Absent Uterine activity  None      Prenatal labs: ABO, Rh: B/Positive/-- (11/12 1422) Antibody: Negative (11/12 1422) Rubella: immune RPR: Non Reactive (03/03 0910)  HBsAg: Negative (11/12 1422)  HIV: Non Reactive (03/03 0910)  GBS: Positive (04/29 1152)   Nursing Staff Provider  Office Location  [facility]  Dating  LMP and 20 week US  Language  ENGLISH  Anatomy US  Normal 20 weeks  Flu Vaccine  Declined  Genetic Screen  NIPS: nl female  AFP:  normal    TDaP vaccine   03/17/2018 Hgb A1C or  GTT Early  Third trimester normal  Rhogam     LAB RESULTS   Feeding Plan BREAST  Blood Type  B/Positive/-- (11/12 1422)   Contraception Undecided/Nexplanon  Antibody Negative (11/12 1422)  Circumcision YES Rubella 1.34 (11/12 1422)  Pediatrician  [facility].  RPR Non Reactive (03/03 0910)   Support Person FOB and Family  HBsAg Negative (11/12 1422)   Prenatal Classes No  HIV Non Reactive (03/03 0910)  BTL Consent  GBS Positive (04/29 1152)(For PCN allergy, check sensitivities)   VBAC Consent  Pap 11/25/17    Hgb Electro  Normal AA    CF No variant    SMA Low risk    Waterbirth  [ ]  Class [ ]  Consent [ ]  CNM visit      Prenatal Transfer Tool  Maternal Diabetes: No Genetic Screening: Normal Maternal Ultrasounds/Referrals: Normal Fetal Ultrasounds or other Referrals:  None Maternal Substance Abuse:  No Significant Maternal Medications:  None Significant Maternal Lab Results: Lab values include: Group B Strep positive    No results found for this or any previous visit (from the past 24 hour(s)).  Assessment: Helen Curtis is a 28 y.o. G3P1011 with an IUP at 6613w1d presenting for IOL for postdates.  Plan: #Labor: Cytotec->Foley->pitocin #Pain:  Per request #FWB Cat 1 #ID: GBS: PCN     Helen Curtis 06/14/2018, 8:04 AM

## 2018-06-13 NOTE — H&P (Deleted)
  The note originally documented on this encounter has been moved the the encounter in which it belongs.  

## 2018-06-14 ENCOUNTER — Inpatient Hospital Stay (HOSPITAL_COMMUNITY): Payer: Medicaid Other

## 2018-06-14 ENCOUNTER — Other Ambulatory Visit: Payer: Self-pay

## 2018-06-14 ENCOUNTER — Encounter (HOSPITAL_COMMUNITY): Payer: Self-pay | Admitting: General Practice

## 2018-06-14 ENCOUNTER — Inpatient Hospital Stay (HOSPITAL_COMMUNITY)
Admission: AD | Admit: 2018-06-14 | Discharge: 2018-06-17 | DRG: 807 | Disposition: A | Discharge status: Home / Self Care | Payer: Medicaid Other | Attending: Obstetrics & Gynecology | Admitting: Obstetrics & Gynecology

## 2018-06-14 DIAGNOSIS — O480 Post-term pregnancy: Secondary | ICD-10-CM | POA: Diagnosis present

## 2018-06-14 DIAGNOSIS — O99891 Other specified diseases and conditions complicating pregnancy: Secondary | ICD-10-CM

## 2018-06-14 DIAGNOSIS — Z3A41 41 weeks gestation of pregnancy: Secondary | ICD-10-CM

## 2018-06-14 DIAGNOSIS — O99824 Streptococcus B carrier state complicating childbirth: Secondary | ICD-10-CM | POA: Diagnosis present

## 2018-06-14 DIAGNOSIS — Z3483 Encounter for supervision of other normal pregnancy, third trimester: Secondary | ICD-10-CM

## 2018-06-14 DIAGNOSIS — M549 Dorsalgia, unspecified: Secondary | ICD-10-CM

## 2018-06-14 LAB — CBC
HCT: 31.9 % — ABNORMAL LOW (ref 36.0–46.0)
Hemoglobin: 10.1 g/dL — ABNORMAL LOW (ref 12.0–15.0)
MCH: 23.8 pg — ABNORMAL LOW (ref 26.0–34.0)
MCHC: 31.7 g/dL (ref 30.0–36.0)
MCV: 75.1 fL — ABNORMAL LOW (ref 80.0–100.0)
Platelets: 190 10*3/uL (ref 150–400)
RBC: 4.25 MIL/uL (ref 3.87–5.11)
RDW: 17 % — ABNORMAL HIGH (ref 11.5–15.5)
WBC: 6.1 10*3/uL (ref 4.0–10.5)
nRBC: 0 % (ref 0.0–0.2)

## 2018-06-14 MED ORDER — FENTANYL-BUPIVACAINE-NACL 0.5-0.125-0.9 MG/250ML-% EP SOLN
EPIDURAL | Status: AC
  Filled 2018-06-14: qty 250

## 2018-06-14 MED ORDER — PHENYLEPHRINE 40 MCG/ML (10ML) SYRINGE FOR IV PUSH (FOR BLOOD PRESSURE SUPPORT): 80.0000 ug | PREFILLED_SYRINGE | INTRAVENOUS | Status: DC | PRN

## 2018-06-14 MED ORDER — SOD CITRATE-CITRIC ACID 500-334 MG/5ML PO SOLN: 30.0000 mL | ORAL | Status: DC | PRN

## 2018-06-14 MED ORDER — LIDOCAINE HCL (PF) 1 % IJ SOLN: 30.0000 mL | INTRAMUSCULAR | Status: DC | PRN

## 2018-06-14 MED ORDER — PENICILLIN G 3 MILLION UNITS IVPB - SIMPLE MED
3.0000 10*6.[IU] | INTRAVENOUS | Status: DC
  Administered 2018-06-14 – 2018-06-15 (×6): 3 10*6.[IU] via INTRAVENOUS
  Filled 2018-06-14 (×6): qty 100

## 2018-06-14 MED ORDER — FENTANYL CITRATE (PF) 100 MCG/2ML IJ SOLN
50.0000 ug | INTRAMUSCULAR | Status: DC | PRN
  Administered 2018-06-14: 18:00:00 50 ug via INTRAVENOUS
  Administered 2018-06-14: 21:00:00 100 ug via INTRAVENOUS
  Filled 2018-06-14 (×2): qty 2

## 2018-06-14 MED ORDER — LACTATED RINGERS IV SOLN
INTRAVENOUS | Status: DC
  Administered 2018-06-14 – 2018-06-15 (×4): via INTRAVENOUS

## 2018-06-14 MED ORDER — DIPHENHYDRAMINE HCL 50 MG/ML IJ SOLN: 12.5000 mg | INTRAMUSCULAR | Status: DC | PRN

## 2018-06-14 MED ORDER — OXYCODONE-ACETAMINOPHEN 5-325 MG PO TABS: 2.0000 | ORAL_TABLET | ORAL | Status: DC | PRN

## 2018-06-14 MED ORDER — NALBUPHINE HCL 10 MG/ML IJ SOLN
10.0000 mg | INTRAMUSCULAR | Status: DC | PRN
  Administered 2018-06-14: 22:00:00 10 mg via INTRAVENOUS
  Filled 2018-06-14: qty 1

## 2018-06-14 MED ORDER — LACTATED RINGERS IV SOLN: 500.0000 mL | INTRAVENOUS | Status: DC | PRN

## 2018-06-14 MED ORDER — OXYCODONE-ACETAMINOPHEN 5-325 MG PO TABS: 1.0000 | ORAL_TABLET | ORAL | Status: DC | PRN

## 2018-06-14 MED ORDER — LACTATED RINGERS IV SOLN
500.0000 mL | Freq: Once | INTRAVENOUS | Status: AC
  Administered 2018-06-14: 23:00:00 500 mL via INTRAVENOUS

## 2018-06-14 MED ORDER — EPHEDRINE 5 MG/ML INJ: 10.0000 mg | INTRAVENOUS | Status: DC | PRN

## 2018-06-14 MED ORDER — OXYTOCIN 40 UNITS IN NORMAL SALINE INFUSION - SIMPLE MED
2.5000 [IU]/h | INTRAVENOUS | Status: DC
  Filled 2018-06-14: qty 1000

## 2018-06-14 MED ORDER — MISOPROSTOL 25 MCG QUARTER TABLET
25.0000 ug | ORAL_TABLET | ORAL | Status: DC
  Administered 2018-06-14 (×3): 25 ug via VAGINAL
  Filled 2018-06-14 (×3): qty 1

## 2018-06-14 MED ORDER — OXYTOCIN BOLUS FROM INFUSION
500.0000 mL | Freq: Once | INTRAVENOUS | Status: AC
  Administered 2018-06-15: 500 mL via INTRAVENOUS

## 2018-06-14 MED ORDER — BUTORPHANOL TARTRATE 1 MG/ML IJ SOLN: 1.0000 mg | INTRAMUSCULAR | Status: DC | PRN

## 2018-06-14 MED ORDER — ONDANSETRON HCL 4 MG/2ML IJ SOLN: 4.0000 mg | Freq: Four times a day (QID) | INTRAMUSCULAR | Status: DC | PRN

## 2018-06-14 MED ORDER — SODIUM CHLORIDE 0.9 % IV SOLN
5.0000 10*6.[IU] | Freq: Once | INTRAVENOUS | Status: AC
  Administered 2018-06-14: 09:00:00 5 10*6.[IU] via INTRAVENOUS
  Filled 2018-06-14: qty 5

## 2018-06-14 MED ORDER — FENTANYL-BUPIVACAINE-NACL 0.5-0.125-0.9 MG/250ML-% EP SOLN
12.0000 mL/h | EPIDURAL | Status: DC | PRN
  Filled 2018-06-14: qty 250

## 2018-06-14 MED ORDER — ACETAMINOPHEN 325 MG PO TABS
650.0000 mg | ORAL_TABLET | ORAL | Status: DC | PRN
  Administered 2018-06-15: 650 mg via ORAL
  Filled 2018-06-14: qty 2

## 2018-06-14 MED ORDER — PROMETHAZINE HCL 25 MG/ML IJ SOLN
12.5000 mg | Freq: Once | INTRAMUSCULAR | Status: AC
  Administered 2018-06-14: 12.5 mg via INTRAVENOUS
  Filled 2018-06-14: qty 1

## 2018-06-14 NOTE — Progress Notes (Signed)
LABOR PROGRESS NOTE  Helen Curtis is a 28 y.o. G3P1011 at [redacted]w[redacted]d  admitted for IOL for postdates.   Subjective: Called to bedside due to RN having difficulty reaching cervix. Introduced myself to patient. She had no questions or concerns.   Objective: LMP 08/29/2017  or There were no vitals filed for this visit.  Dilation: Closed Effacement (%): Thick Station: -2 Presentation: Vertex Exam by:: Dr Aggie Hacker: baseline rate 140, moderate varibility, +acel, no decel Toco: Occasional   Labs: Lab Results  Component Value Date   WBC 6.1 06/14/2018   HGB 10.1 (L) 06/14/2018   HCT 31.9 (L) 06/14/2018   MCV 75.1 (L) 06/14/2018   PLT 190 06/14/2018    Patient Active Problem List   Diagnosis Date Noted  . Post-dates pregnancy 06/14/2018  . GBS (group B Streptococcus carrier), +RV culture, currently pregnant 06/03/2018  . Back pain affecting pregnancy in third trimester 03/18/2018  . Supervision of normal pregnancy 11/25/2017  . Obesity 01/12/2011    Assessment / Plan: 28 y.o. G3P1011 at [redacted]w[redacted]d here for IOL postdates.   Labor: Cervix long/thick/closed. Not favorable. Will start induction with cytotec.  Fetal Wellbeing:  Cat I  Pain Control: Maternal support for now. Not currently feeling contractions.  Anticipated MOD:  NSVD   Marcy Siren, D.O. OB Fellow  06/14/2018, 9:01 AM

## 2018-06-14 NOTE — Progress Notes (Signed)
LABOR PROGRESS NOTE  Helen Curtis is a 28 y.o. G3P1011 at [redacted]w[redacted]d  admitted for IOL for postdates   Subjective: Patient breathing through contractions, not coping well with contractions and pain, requesting additional medication   Objective: BP (!) 113/57   Pulse 88   Temp 98.2 F (36.8 C) (Oral)   Resp 16   Ht 5\' 7"  (1.702 m)   Wt 109.6 kg   LMP 08/29/2017   BMI 37.84 kg/m  or  Vitals:   06/14/18 1902 06/14/18 2001 06/14/18 2035 06/14/18 2221  BP: (!) 96/43 107/66 (!) 105/53 (!) 113/57  Pulse: 76 74 83 88  Resp: 16 16 16 16   Temp:  98.2 F (36.8 C)    TempSrc:  Oral    Weight:      Height:        FB placed @ 2200  Dilation: 2 Effacement (%): 90 Cervical Position: Posterior Station: -2 Presentation: Vertex Exam by:: Lanice Shirts, CNM FHT: baseline rate 130, moderate varibility, +accel, no decel Toco: 1.5-4  Labs: Lab Results  Component Value Date   WBC 6.1 06/14/2018   HGB 10.1 (L) 06/14/2018   HCT 31.9 (L) 06/14/2018   MCV 75.1 (L) 06/14/2018   PLT 190 06/14/2018    Patient Active Problem List   Diagnosis Date Noted  . Post-dates pregnancy 06/14/2018  . GBS (group B Streptococcus carrier), +RV culture, currently pregnant 06/03/2018  . Back pain affecting pregnancy in third trimester 03/18/2018  . Supervision of normal pregnancy 11/25/2017  . Obesity 01/12/2011    Assessment / Plan: 28 y.o. G3P1011 at [redacted]w[redacted]d here for IOL for PD   Labor: FB placed @ 2200, plan for pitocin once FB out  Fetal Wellbeing:  Cat I  Pain Control:  IV nubain and phenergan, plans epidural once FB out  Anticipated MOD:  SVD  [name], CNM 06/14/2018, 10:32 PM

## 2018-06-15 ENCOUNTER — Inpatient Hospital Stay (HOSPITAL_COMMUNITY): Payer: Medicaid Other | Admitting: Anesthesiology

## 2018-06-15 ENCOUNTER — Encounter (HOSPITAL_COMMUNITY): Payer: Self-pay | Admitting: *Deleted

## 2018-06-15 DIAGNOSIS — O480 Post-term pregnancy: Secondary | ICD-10-CM

## 2018-06-15 DIAGNOSIS — Z3A41 41 weeks gestation of pregnancy: Secondary | ICD-10-CM

## 2018-06-15 LAB — TYPE AND SCREEN
ABO/RH(D): B POS
Antibody Screen: NEGATIVE

## 2018-06-15 LAB — RPR: RPR Ser Ql: NONREACTIVE

## 2018-06-15 LAB — ABO/RH: ABO/RH(D): B POS

## 2018-06-15 MED ORDER — SIMETHICONE 80 MG PO CHEW: 80.0000 mg | CHEWABLE_TABLET | ORAL | Status: DC | PRN

## 2018-06-15 MED ORDER — IBUPROFEN 600 MG PO TABS
600.0000 mg | ORAL_TABLET | Freq: Four times a day (QID) | ORAL | Status: DC
  Administered 2018-06-15 – 2018-06-17 (×8): 600 mg via ORAL
  Filled 2018-06-15 (×8): qty 1

## 2018-06-15 MED ORDER — ONDANSETRON HCL 4 MG/2ML IJ SOLN: 4.0000 mg | INTRAMUSCULAR | Status: DC | PRN

## 2018-06-15 MED ORDER — LIDOCAINE HCL (PF) 1 % IJ SOLN
INTRAMUSCULAR | Status: DC | PRN
  Administered 2018-06-15 (×2): 5 mL via EPIDURAL

## 2018-06-15 MED ORDER — ACETAMINOPHEN 500 MG PO TABS: 1000.0000 mg | ORAL_TABLET | Freq: Once | ORAL | Status: DC

## 2018-06-15 MED ORDER — HYDROXYZINE HCL 50 MG/ML IM SOLN: 25.0000 mg | Freq: Once | INTRAMUSCULAR | Status: DC

## 2018-06-15 MED ORDER — DIBUCAINE (PERIANAL) 1 % EX OINT: 1.0000 "application " | TOPICAL_OINTMENT | CUTANEOUS | Status: DC | PRN

## 2018-06-15 MED ORDER — SODIUM CHLORIDE (PF) 0.9 % IJ SOLN
INTRAMUSCULAR | Status: DC | PRN
  Administered 2018-06-15: 12 mL/h via EPIDURAL

## 2018-06-15 MED ORDER — TETANUS-DIPHTH-ACELL PERTUSSIS 5-2.5-18.5 LF-MCG/0.5 IM SUSP: 0.5000 mL | Freq: Once | INTRAMUSCULAR | Status: DC

## 2018-06-15 MED ORDER — DOCUSATE SODIUM 100 MG PO CAPS
100.0000 mg | ORAL_CAPSULE | Freq: Two times a day (BID) | ORAL | Status: DC
  Administered 2018-06-15 – 2018-06-17 (×4): 100 mg via ORAL
  Filled 2018-06-15 (×4): qty 1

## 2018-06-15 MED ORDER — MEASLES, MUMPS & RUBELLA VAC IJ SOLR: 0.5000 mL | Freq: Once | INTRAMUSCULAR | Status: DC

## 2018-06-15 MED ORDER — HYDROXYZINE HCL 50 MG/ML IM SOLN: 25.0000 mg | Freq: Four times a day (QID) | INTRAMUSCULAR | Status: DC | PRN

## 2018-06-15 MED ORDER — BENZOCAINE-MENTHOL 20-0.5 % EX AERO
1.0000 "application " | INHALATION_SPRAY | CUTANEOUS | Status: DC | PRN
  Administered 2018-06-15: 1 via TOPICAL
  Filled 2018-06-15: qty 56

## 2018-06-15 MED ORDER — COCONUT OIL OIL: 1.0000 "application " | TOPICAL_OIL | Status: DC | PRN

## 2018-06-15 MED ORDER — PRENATAL MULTIVITAMIN CH
1.0000 | ORAL_TABLET | Freq: Every day | ORAL | Status: DC
  Administered 2018-06-16 – 2018-06-17 (×2): 1 via ORAL
  Filled 2018-06-15 (×2): qty 1

## 2018-06-15 MED ORDER — OXYTOCIN 40 UNITS IN NORMAL SALINE INFUSION - SIMPLE MED
1.0000 m[IU]/min | INTRAVENOUS | Status: DC
  Administered 2018-06-15: 2 m[IU]/min via INTRAVENOUS
  Administered 2018-06-15: 1 m[IU]/min via INTRAVENOUS

## 2018-06-15 MED ORDER — WITCH HAZEL-GLYCERIN EX PADS
1.0000 "application " | MEDICATED_PAD | CUTANEOUS | Status: DC | PRN
  Administered 2018-06-15: 1 via TOPICAL

## 2018-06-15 MED ORDER — DIPHENHYDRAMINE HCL 25 MG PO CAPS: 25.0000 mg | ORAL_CAPSULE | Freq: Four times a day (QID) | ORAL | Status: DC | PRN

## 2018-06-15 MED ORDER — ONDANSETRON HCL 4 MG PO TABS: 4.0000 mg | ORAL_TABLET | ORAL | Status: DC | PRN

## 2018-06-15 MED ORDER — LACTATED RINGERS IV SOLN: 500.0000 mL | Freq: Once | INTRAVENOUS | Status: DC

## 2018-06-15 MED ORDER — ACETAMINOPHEN 325 MG PO TABS
650.0000 mg | ORAL_TABLET | ORAL | Status: DC | PRN
  Administered 2018-06-15 – 2018-06-17 (×4): 650 mg via ORAL
  Filled 2018-06-15 (×4): qty 2

## 2018-06-15 NOTE — Progress Notes (Signed)
LABOR PROGRESS NOTE  Helen Curtis is a 28 y.o. G3P1011 at [redacted]w[redacted]d  admitted for IOL for PD  Subjective: Patient reports being extremely anxious and nervous, hx of anxiety was prescribed medication but never took (unsure what she was prescribed).  Objective: BP (!) 106/59   Pulse 79   Temp 98.1 F (36.7 C) (Oral)   Resp 16   Ht 5\' 7"  (1.702 m)   Wt 109.6 kg   LMP 08/29/2017   BMI 37.84 kg/m  or  Vitals:   06/14/18 2035 06/14/18 2221 06/15/18 0010 06/15/18 0131  BP: (!) 105/53 (!) 113/57 (!) 115/52 (!) 106/59  Pulse: 83 88 76 79  Resp: 16 16 16 16   Temp:   98.1 F (36.7 C) 98.1 F (36.7 C)  TempSrc:   Oral Oral  Weight:      Height:        AROM @0135 - mod mec  Dilation: 6.5 Effacement (%): 90 Cervical Position: Posterior Station: -2 Presentation: Vertex Exam by:: Lanice Shirts, CNM FHT: baseline rate 135, moderate varibility, +accel, variable decel Toco: 3  Labs: Lab Results  Component Value Date   WBC 6.1 06/14/2018   HGB 10.1 (L) 06/14/2018   HCT 31.9 (L) 06/14/2018   MCV 75.1 (L) 06/14/2018   PLT 190 06/14/2018    Patient Active Problem List   Diagnosis Date Noted  . Post-dates pregnancy 06/14/2018  . GBS (group B Streptococcus carrier), +RV culture, currently pregnant 06/03/2018  . Back pain affecting pregnancy in third trimester 03/18/2018  . Supervision of normal pregnancy 11/25/2017  . Obesity 01/12/2011    Assessment / Plan: 28 y.o. G3P1011 at [redacted]w[redacted]d here for IOL for PD   Labor: Progressing well, AROM and pitocin initiated Fetal Wellbeing:  Cat II  Pain Control:  Epidural  Anticipated MOD:  SVD Anxiety: vistaril one time dose ordered   [name], CNM 06/15/2018, 1:47 AM

## 2018-06-15 NOTE — Progress Notes (Signed)
Vitals:   06/15/18 0500 06/15/18 0530 06/15/18 0600 06/15/18 0731  BP: 119/78 (!) 110/57 103/60 (!) 97/50  Pulse: 100 87 87 95  Resp: 16 16 16 18   Temp:      TempSrc:      Weight:      Height:        Patient pushed for 1 hour + with little move of baby  Patient reports feeling intermittent pressure and "is tired"  Given option of laboring down and resting, will reassess in 1 hour and begin pushing again   Dilation: 10 Dilation Complete Date: 06/15/18 Dilation Complete Time: 0450 Effacement (%): 100 Cervical Position: Posterior Station: 0, Plus 1 Presentation: Vertex Exam by:: [place], rn  Patient positioned on peanut ball, continue titrating pitocin during this time  Cat II tracing, occasional variables  Plans SVD   [name], CNM 06/15/18, 7:50 AM

## 2018-06-15 NOTE — Anesthesia Preprocedure Evaluation (Signed)
Anesthesia Evaluation  Patient identified by MRN, date of birth, ID band Patient awake    Reviewed: Allergy & Precautions, H&P , NPO status , Patient's Chart, lab work & pertinent test results  Airway Mallampati: II   Neck ROM: full    Dental   Pulmonary neg pulmonary ROS,    breath sounds clear to auscultation       Cardiovascular negative cardio ROS   Rhythm:regular Rate:Normal     Neuro/Psych    GI/Hepatic   Endo/Other    Renal/GU      Musculoskeletal   Abdominal   Peds  Hematology  (+) anemia ,   Anesthesia Other Findings   Reproductive/Obstetrics (+) Pregnancy                             Anesthesia Physical Anesthesia Plan  ASA: II  Anesthesia Plan: Epidural   Post-op Pain Management:    Induction: Intravenous  PONV Risk Score and Plan: 2 and Treatment may vary due to age or medical condition  Airway Management Planned: Natural Airway  Additional Equipment:   Intra-op Plan:   Post-operative Plan:   Informed Consent: I have reviewed the patients History and Physical, chart, labs and discussed the procedure including the risks, benefits and alternatives for the proposed anesthesia with the patient or authorized representative who has indicated his/her understanding and acceptance.     Plan Discussed with: Anesthesiologist  Anesthesia Plan Comments:         Anesthesia Quick Evaluation  

## 2018-06-15 NOTE — Anesthesia Procedure Notes (Signed)
Epidural Patient location during procedure: OB Start time: 06/15/2018 12:10 AM End time: 06/15/2018 12:20 AM  Staffing Anesthesiologist: Achille Rich, MD Performed: anesthesiologist   Preanesthetic Checklist Completed: patient identified, site marked, pre-op evaluation, timeout performed, IV checked, risks and benefits discussed and monitors and equipment checked  Epidural Patient position: sitting Prep: DuraPrep Patient monitoring: heart rate, cardiac monitor, continuous pulse ox and blood pressure Approach: midline Location: L2-L3 Injection technique: LOR saline  Needle:  Needle type: Tuohy  Needle gauge: 17 G Needle length: 9 cm Needle insertion depth: 5 cm Catheter type: closed end flexible Catheter size: 19 Gauge Catheter at skin depth: 12 cm Test dose: negative and Other  Assessment Events: blood not aspirated, injection not painful, no injection resistance and negative IV test  Additional Notes Informed consent obtained prior to proceeding including risk of failure, 1% risk of PDPH, risk of minor discomfort and bruising.  Discussed rare but serious complications including epidural abscess, permanent nerve injury, epidural hematoma.  Discussed alternatives to epidural analgesia and patient desires to proceed.  Timeout performed pre-procedure verifying patient name, procedure, and platelet count.  Patient tolerated procedure well. Reason for block:procedure for pain

## 2018-06-15 NOTE — Anesthesia Postprocedure Evaluation (Signed)
Anesthesia Post Note  Patient: Helen Curtis  Procedure(s) Performed: AN AD HOC LABOR EPIDURAL     Patient location during evaluation: Mother Baby Anesthesia Type: Epidural Level of consciousness: awake Pain management: satisfactory to patient Vital Signs Assessment: post-procedure vital signs reviewed and stable Respiratory status: spontaneous breathing Cardiovascular status: stable Anesthetic complications: no    Last Vitals:  Vitals:   06/15/18 1145 06/15/18 1235  BP:  120/61  Pulse:  81  Resp: 18 17  Temp:  36.7 C    Last Pain:  Vitals:   06/15/18 1235  TempSrc: Oral  PainSc: 0-No pain   Pain Goal:                   [employer]

## 2018-06-15 NOTE — Discharge Summary (Signed)
Obstetrics Discharge Summary [facility]N [facility]   Patient Name: Helen Curtis DOB: 1990/09/12 MRN: [id number]  Date of admission: 06/14/2018 Delivering MD: Glenice Bow   Date of discharge: 06/16/2018  Admitting diagnosis: post-dates pregnancy Intrauterine pregnancy: [redacted]w[redacted]d    Secondary diagnosis:   Active Problems:   Post-dates pregnancy    Discharge diagnosis: Term Pregnancy Delivered                                            Postpartum procedures: None Complications: vacuum-assisted delivery (prolonged 2nd stage, deep variables with pushing)  2nd degree laceration  Outpatient Follow-Up: PP visit with Nexplanon insertion scheduled 07/15/18  Hospital course: Helen Curtis 28y.o. 437w3dho was admitted for induction of labor for post-dates pregnancy. Her pregnancy was complicated by same. Her labor course was notable for induction with FB, cytotec, pitocin, AROM with moderate meconium, epidural placement. Delivery was complicated by vacuum-assisted delivery, repaired 2nd degree laceration. Please see delivery/op note for additional details. Her postpartum course was uncomplicated. She was breastfeeding without difficulty. By day of discharge, she was passing flatus, urinating, eating and drinking without difficulty. Her pain was well-controlled, and she was discharged home with Ibuprofen. She will follow-up in clinic in 4 weeks.   Physical exam  Vitals:   06/15/18 1345 06/15/18 1734 06/15/18 2150 06/16/18 0430  BP: 115/65 (!) 121/59 122/60 (!) 104/55  Pulse: 85 84 82 71  Resp: 16 17 18 18   Temp: 97.7 F (36.5 C) 98.3 F (36.8 C) 98.1 F (36.7 C) 98.3 F (36.8 C)  TempSrc: Oral Oral Oral Oral  Weight:      Height:       General: Alert, cooperative, no distress Lochia: appropriate Uterine Fundus: firm Incision: N/Curtis DVT Evaluation: No evidence of DVT seen on physical exam. No cords or calf tenderness. No significant calf/ankle edema.  Labs: Lab  Results  Component Value Date   WBC 6.1 06/14/2018   HGB 10.1 (L) 06/14/2018   HCT 31.9 (L) 06/14/2018   MCV 75.1 (L) 06/14/2018   PLT 190 06/14/2018   CMP Latest Ref Rng & Units 11/12/2017  Glucose 70 - 99 mg/dL 86  BUN 6 - 20 mg/dL 10  Creatinine 0.44 - 1.00 mg/dL 0.59  Sodium 135 - 145 mmol/L 135  Potassium 3.5 - 5.1 mmol/L 3.8  Chloride 98 - 111 mmol/L 104  CO2 22 - 32 mmol/L 25  Calcium 8.9 - 10.3 mg/dL 8.9  Total Protein 6.0 - 8.3 g/dL -  Total Bilirubin 0.3 - 1.2 mg/dL -  Alkaline Phos 39 - 117 U/L -  AST 0 - 37 U/L -  ALT 0 - 35 U/L -    Discharge instructions: Per After Visit Summary and "Baby and Me Booklet"  After visit meds:  Allergies as of 06/16/2018   No Known Allergies     Medication List    TAKE these medications   acetaminophen 325 MG tablet Commonly known as:  TYLENOL Take 650 mg by mouth every 6 (six) hours as needed for headache.   Blood Pressure Monitor Kit Apply 1 each topically 1 day or 1 dose for 1 dose.   cyclobenzaprine 10 MG tablet Commonly known as:  FLEXERIL Take 1 tablet (10 mg total) by mouth 2 (two) times daily as needed for muscle spasms.   ibuprofen 600 MG tablet Commonly known as:  ADVIL Take 1 tablet (600 mg total) by mouth every 6 (six) hours.   Vitafol Gummies 3.33-0.333-34.8 MG Chew Chew 3 tablets by mouth daily before breakfast.       Postpartum contraception:  Nexplanon - outpatient at [facility] visit Diet: Routine Diet Activity: Advance as tolerated. Pelvic rest for 6 weeks.   Follow-up Appt: Future Appointments  Date Time Provider [place]  07/15/2018  1:00 PM Lajean Manes, CNM [facility] None   Follow-up Visit:No follow-ups on file.  Please schedule this patient for Postpartum visit in: 4 weeks with the following provider: Any provider Low risk pregnancy complicated by: none Delivery mode:  SVD Anticipated Birth Control:  Nexplanon PP Procedures needed: none  Schedule Integrated [place] visit: no  Newborn  Data: Live born female  Birth Weight: 9 lb 3.8 oz (4190 g) APGAR: 8, 9  Newborn Delivery   Birth date/time:  06/15/2018 09:40:00 Delivery type:  Vaginal, Vacuum (Extractor)    Baby Feeding: Breast Disposition:home with mother   Danielle Rankin 06/16/2018 10:12 AM

## 2018-06-16 ENCOUNTER — Encounter (HOSPITAL_COMMUNITY): Payer: Self-pay | Admitting: Family Medicine

## 2018-06-16 MED ORDER — IBUPROFEN 600 MG PO TABS: 600.0000 mg | ORAL_TABLET | Freq: Four times a day (QID) | ORAL | 0 refills | Status: DC

## 2018-06-16 NOTE — Progress Notes (Signed)
Post Partum Day 1 Subjective: Pt reports feeling SOB with walking and some difficulty walking in her hips and butt. She is otherwise tolerating PE, up ad lib and voiding. No stool.  Objective: Blood pressure (!) 104/55, pulse 71, temperature 98.3 F (36.8 C), temperature source Oral, resp. rate 18, height 5\' 7"  (1.702 m), weight 109.6 kg, last menstrual period 08/29/2017, unknown if currently breastfeeding.  Intake/Output      06/01 0701 - 06/02 0700 06/02 0701 - 06/03 0700   Urine (mL/kg/hr) 1250 (0.5)    Blood 246    Total Output 1496    Net -1496           Physical Exam:  General: alert, cooperative and no distress  Lungs: CTAB, no crackles Heart: 1/6 systolic ejection murmur Lochia: appropriate Uterine Fundus: firm DVT Evaluation: No evidence of DVT seen on physical exam. No cords or calf tenderness. No significant calf/ankle edema.  Recent Labs    06/14/18 0713  HGB 10.1*  HCT 31.9*    Assessment/Plan: Plan for discharge tomorrow, Breastfeeding, Lactation consult and Contraception - patient is unsure about whether she will get Nexplanon placed here or at her OBGYN f/u.  - Up ad lib - monitor SOB - breastfeeding going well   LOS: 2 days   Melene Plan 06/16/2018, 7:41 AM

## 2018-06-16 NOTE — Discharge Instructions (Signed)
Etonogestrel implant What is this medicine? ETONOGESTREL (et oh noe JES trel) is a contraceptive (birth control) device. It is used to prevent pregnancy. It can be used for up to 3 years. This medicine may be used for other purposes; ask your health care provider or pharmacist if you have questions. COMMON BRAND NAME(S): Implanon, Nexplanon What should I tell my health care provider before I take this medicine? They need to know if you have any of these conditions: -abnormal vaginal bleeding -blood vessel disease or blood clots -breast, cervical, endometrial, ovarian, liver, or uterine cancer -diabetes -gallbladder disease -heart disease or recent heart attack -high blood pressure -high cholesterol or triglycerides -kidney disease -liver disease -migraine headaches -seizures -stroke -tobacco smoker -an unusual or allergic reaction to etonogestrel, anesthetics or antiseptics, other medicines, foods, dyes, or preservatives -pregnant or trying to get pregnant -breast-feeding How should I use this medicine? This device is inserted just under the skin on the inner side of your upper arm by a health care professional. Talk to your pediatrician regarding the use of this medicine in children. Special care may be needed. Overdosage: If you think you have taken too much of this medicine contact a poison control center or emergency room at once. NOTE: This medicine is only for you. Do not share this medicine with others. What if I miss a dose? This does not apply. What may interact with this medicine? Do not take this medicine with any of the following medications: -amprenavir -fosamprenavir This medicine may also interact with the following medications: -acitretin -aprepitant -armodafinil -bexarotene -bosentan -carbamazepine -certain medicines for fungal infections like fluconazole, ketoconazole, itraconazole and voriconazole -certain medicines to treat hepatitis, HIV or  AIDS -cyclosporine -felbamate -griseofulvin -lamotrigine -modafinil -oxcarbazepine -phenobarbital -phenytoin -primidone -rifabutin -rifampin -rifapentine -St. John's wort -topiramate This list may not describe all possible interactions. Give your health care provider a list of all the medicines, herbs, non-prescription drugs, or dietary supplements you use. Also tell them if you smoke, drink alcohol, or use illegal drugs. Some items may interact with your medicine. What should I watch for while using this medicine? This product does not protect you against HIV infection (AIDS) or other sexually transmitted diseases. You should be able to feel the implant by pressing your fingertips over the skin where it was inserted. Contact your doctor if you cannot feel the implant, and use a non-hormonal birth control method (such as condoms) until your doctor confirms that the implant is in place. Contact your doctor if you think that the implant may have broken or become bent while in your arm. You will receive a user card from your health care provider after the implant is inserted. The card is a record of the location of the implant in your upper arm and when it should be removed. Keep this card with your health records. What side effects may I notice from receiving this medicine? Side effects that you should report to your doctor or health care professional as soon as possible: -allergic reactions like skin rash, itching or hives, swelling of the face, lips, or tongue -breast lumps, breast tissue changes, or discharge -breathing problems -changes in emotions or moods -if you feel that the implant may have broken or bent while in your arm -high blood pressure -pain, irritation, swelling, or bruising at the insertion site -scar at site of insertion -signs of infection at the insertion site such as fever, and skin redness, pain or discharge -signs and symptoms of a blood clot such  as breathing  problems; changes in vision; chest pain; severe, sudden headache; pain, swelling, warmth in the leg; trouble speaking; sudden numbness or weakness of the face, arm or leg -signs and symptoms of liver injury like dark yellow or brown urine; general ill feeling or flu-like symptoms; light-colored stools; loss of appetite; nausea; right upper belly pain; unusually weak or tired; yellowing of the eyes or skin -unusual vaginal bleeding, discharge Side effects that usually do not require medical attention (report to your doctor or health care professional if they continue or are bothersome): -acne -breast pain or tenderness -headache -irregular menstrual bleeding -nausea This list may not describe all possible side effects. Call your doctor for medical advice about side effects. You may report side effects to FDA at 1-800-FDA-1088. Where should I keep my medicine? This drug is given in a hospital or clinic and will not be stored at home. NOTE: This sheet is a summary. It may not cover all possible information. If you have questions about this medicine, talk to your doctor, pharmacist, or health care provider.  2019 Elsevier/Gold Standard (2016-11-19 14:11:42) Vaginal Delivery, Care After Refer to this sheet in the next few weeks. These instructions provide you with information about caring for yourself after vaginal delivery. Your health care provider may also give you more specific instructions. Your treatment has been planned according to current medical practices, but problems sometimes occur. Call your health care provider if you have any problems or questions. What can I expect after the procedure? After vaginal delivery, it is common to have:  Some bleeding from your vagina.  Soreness in your abdomen, your vagina, and the area of skin between your vaginal opening and your anus (perineum).  Pelvic cramps.  Fatigue. Follow these instructions at home: Medicines  Take over-the-counter and  prescription medicines only as told by your health care provider.  If you were prescribed an antibiotic medicine, take it as told by your health care provider. Do not stop taking the antibiotic until it is finished. Driving   Do not drive or operate heavy machinery while taking prescription pain medicine.  Do not drive for 24 hours if you received a sedative. Lifestyle  Do not drink alcohol. This is especially important if you are breastfeeding or taking medicine to relieve pain.  Do not use tobacco products, including cigarettes, chewing tobacco, or e-cigarettes. If you need help quitting, ask your health care provider. Eating and drinking  Drink at least 8 eight-ounce glasses of water every day unless you are told not to by your health care provider. If you choose to breastfeed your baby, you may need to drink more water than this.  Eat high-fiber foods every day. These foods may help prevent or relieve constipation. High-fiber foods include: ? Whole grain cereals and breads. ? Brown rice. ? Beans. ? Fresh fruits and vegetables. Activity  Return to your normal activities as told by your health care provider. Ask your health care provider what activities are safe for you.  Rest as much as possible. Try to rest or take a nap when your baby is sleeping.  Do not lift anything that is heavier than your baby or 10 lb (4.5 kg) until your health care provider says that it is safe.  Talk with your health care provider about when you can engage in sexual activity. This may depend on your: ? Risk of infection. ? Rate of healing. ? Comfort and desire to engage in sexual activity. Vaginal Care  If you  have an episiotomy or a vaginal tear, check the area every day for signs of infection. Check for: ? More redness, swelling, or pain. ? More fluid or blood. ? Warmth. ? Pus or a bad smell.  Do not use tampons or douches until your health care provider says this is safe.  Watch for any  blood clots that may pass from your vagina. These may look like clumps of dark red, brown, or black discharge. General instructions  Keep your perineum clean and dry as told by your health care provider.  Wear loose, comfortable clothing.  Wipe from front to back when you use the toilet.  Ask your health care provider if you can shower or take a bath. If you had an episiotomy or a perineal tear during labor and delivery, your health care provider may tell you not to take baths for a certain length of time.  Wear a bra that supports your breasts and fits you well.  If possible, have someone help you with household activities and help care for your baby for at least a few days after you leave the hospital.  Keep all follow-up visits for you and your baby as told by your health care provider. This is important. Contact a health care provider if:  You have: ? Vaginal discharge that has a bad smell. ? Difficulty urinating. ? Pain when urinating. ? A sudden increase or decrease in the frequency of your bowel movements. ? More redness, swelling, or pain around your episiotomy or vaginal tear. ? More fluid or blood coming from your episiotomy or vaginal tear. ? Pus or a bad smell coming from your episiotomy or vaginal tear. ? A fever. ? A rash. ? Little or no interest in activities you used to enjoy. ? Questions about caring for yourself or your baby.  Your episiotomy or vaginal tear feels warm to the touch.  Your episiotomy or vaginal tear is separating or does not appear to be healing.  Your breasts are painful, hard, or turn red.  You feel unusually sad or worried.  You feel nauseous or you vomit.  You pass large blood clots from your vagina. If you pass a blood clot from your vagina, save it to show to your health care provider. Do not flush blood clots down the toilet without having your health care provider look at them.  You urinate more than usual.  You are dizzy or  light-headed.  You have not breastfed at all and you have not had a menstrual period for 12 weeks after delivery.  You have stopped breastfeeding and you have not had a menstrual period for 12 weeks after you stopped breastfeeding. Get help right away if:  You have: ? Pain that does not go away or does not get better with medicine. ? Chest pain. ? Difficulty breathing. ? Blurred vision or spots in your vision. ? Thoughts about hurting yourself or your baby.  You develop pain in your abdomen or in one of your legs.  You develop a severe headache.  You faint.  You bleed from your vagina so much that you fill two sanitary pads in one hour. This information is not intended to replace advice given to you by your health care provider. Make sure you discuss any questions you have with your health care provider. Document Released: 12/29/1999 Document Revised: 06/14/2015 Document Reviewed: 01/15/2015 Elsevier Interactive Patient Education  2019 [employer]

## 2018-06-16 NOTE — Lactation Note (Signed)
This note was copied from a baby's chart. Lactation Consultation Note  Patient Name: Helen Curtis Date: 06/16/2018 Reason for consult: Initial assessment;Term P2. 20 hour female infant. Mom receives   [facility] in [place]. Mom has DEBP at home. Mom previously breastfeed her 29 year old for 3 months. Per mom, latching is improving infant latched 5 x since birth. Per mom,  infant breastfeed 17 minutes at 4 am and then 30 minutes from 5:30 to 6 pm nurse help latch. Aunt holding infant in room. LC did not observe a latch at this time. LC discussed hand expression and mom taught back infant was given 4 ml of colostrum by spoon. Infant had 2 voids since birth. LC observed pacifier in basinet and LC discussed possible risk factors with pacifier usage mom decided she will wait one month before offering pacifier  again.  Mom knows to breastfeed according hunger cues, 8 to 12 times within 24 hours and on demand. Mom knows to call Nurse or LC if she needs assistance with latching infant to breast.  LC discussed I & O. Reviewed Baby & Me book's Breastfeeding Basics.  Mom made aware of O/P services, breastfeeding support groups, community resources, and our phone # for post-discharge questions.  Maternal Data Formula Feeding for Exclusion: No Has patient been taught Hand Expression?: Yes Does the patient have breastfeeding experience prior to this delivery?: Yes  Feeding Feeding Type: Breast Fed  LATCH Score                   Interventions Interventions: Breast feeding basics reviewed;Skin to skin;Hand express;Expressed milk;Position options  Lactation Tools Discussed/Used WIC Program: Yes   Consult Status Consult Status: Follow-up Date: 06/16/18 Follow-up type: In-patient    Danelle Earthly 06/16/2018, 6:38 AM

## 2018-06-16 NOTE — Lactation Note (Signed)
This note was copied from a baby's chart. Lactation Consultation Note  Patient Name: Helen Curtis [id number] Date: 06/16/2018 Reason for consult: Follow-up assessment;Infant weight loss(Early D/C pending the serum Bili rubin )  Baby is 25 hours old  As LC entered the room baby was just finishing the 2nd breast x 20 mins per mom.  And also fed the 1st breast at 9:30. After the Dr. Francia Greaves baby re-latched with minimal assist for depth.  Swallows noted and per mom comfortable.  Per mom with her 1st baby  had engorgement issues.  LC explained sore nipple and engorgement prevention and tx, and it's easier to prevent  And more difficult to  Tc engorgement. Per mom has DEBP at home.  LC stressed the importance of STS Feedings until the baby is back to birth weight, gaining steadily and can  Stay awake for majority of feeding. Discussed nutritive vs non- nutritive feeding patterns and the importance of  Watching the baby for hanging out latched.  If the baby only feeds 1st breast and the other breast is full to release to comfort. Can spoon feed back to baby Mom aware how to spoon feed. Per mom active with WIC / [place].  Mom aware of the  Lactation resources and has the pamphlet with phone number.   Mom receptive to [facility] assistance and care.    Maternal Data Has patient been taught Hand Expression?: Yes  Feeding Feeding Type: Breast Fed  LATCH Score Latch: Grasps breast easily, tongue down, lips flanged, rhythmical sucking.  Audible Swallowing: Spontaneous and intermittent  Type of Nipple: Everted at rest and after stimulation  Comfort (Breast/Nipple): Soft / non-tender  Hold (Positioning): Assistance needed to correctly position infant at breast and maintain latch.  LATCH Score: 9  Interventions Interventions: Breast feeding basics reviewed;Assisted with latch;Skin to skin;Breast compression;Adjust position;Support pillows;Position options  Lactation Tools Discussed/Used WIC  Program: Yes Pump Review: Milk Storage   Consult Status Consult Status: Complete Date: 06/16/18 Follow-up type: In-patient    Matilde Sprang Baldo Hufnagle 06/16/2018, 11:07 AM

## 2018-06-16 NOTE — Lactation Note (Signed)
This note was copied from a baby's chart. Lactation Consultation Note  Patient Name: Boy Brayah Haenel [id number] Date: 06/16/2018  P2, 16 hour female infant. LC entered room Mom and infant asleep.    Maternal Data    Feeding    LATCH Score                   Interventions    Lactation Tools Discussed/Used     Consult Status      Danelle Earthly 06/16/2018, 1:51 AM

## 2018-06-17 MED ORDER — MEDROXYPROGESTERONE ACETATE 150 MG/ML IM SUSP
150.0000 mg | Freq: Once | INTRAMUSCULAR | Status: AC
  Administered 2018-06-17: 150 mg via INTRAMUSCULAR
  Filled 2018-06-17: qty 1

## 2018-06-17 NOTE — Lactation Note (Signed)
This note was copied from a baby's chart. Lactation Consultation Note  Patient Name: Helen Curtis [id number] Date: 06/17/2018 Reason for consult: Follow-up assessment;Infant weight loss;Other (Comment)(3% weightg loss / NO stool since large mec at birth per Pedis MD / post dates )   Baby is 80 hours old , Bili 8.3 at 44 hours  Per mom formula was started due to the baby not having a stool since birth.  Mom has been breast / formula 8-25 ml and breast.  Mom expressed she really wants to breast feed.  LC reviewed supply and demand and the importance of consistent latching around the clock.  Since the baby has been introduced to formula may have to give an appetizer prior to latching  Due to the baby being inpatient with the slower flow.  Per mom will have a pump at home. LC encouraged mom to 1st get a nap at home and after feeding  At the breast to post pump both breast for 15 -20 mins until the milk comes in. Once the milk comes in  Always soften the 1st breast and offer the 2nd breast is still hungry. If EBM Is available supplement with EBM 1st and make Up the difference with formula. LC explained to mom the EBM will clean the gut out faster and better than formula.  Mom mentioned her nipples are sensitive and requested for the [facility] to check. LC noted 2 small areas on the  The left breast that appeared to be reabsorbed and intake. No breakdown.  Sore nipple and engorgement prevention and tx reviewed. ( per mom familiar due to having engorgement with her  1st baby).  LC instructed mom on the use shells between feedings except when sleeping.  Mom will have F/U tomorrow for the Pedis visit.      Maternal Data Has patient been taught Hand Expression?: Yes(LC reviewed with mom and she was able to demo back with several drops noted )  Feeding - baby last fed at 930 20 ml , and 0815 prior to that feeding.   LATCH Score                   Interventions Interventions: Breast  feeding basics reviewed;Shells  Lactation Tools Discussed/Used WIC Program: Yes   Consult Status Consult Status: Complete Date: 06/17/18    Kathrin Greathouse 06/17/2018, 10:34 AM

## 2018-06-17 NOTE — Lactation Note (Signed)
This note was copied from a baby's chart. Lactation Consultation Note  Patient Name: Boy Metta Pingree MAUQJ'F Date: 06/17/2018 Reason for consult: Follow-up assessment;Infant weight loss;Other (Comment)(3% weightg loss / NO stool since large mec at birth per Pedis MD / post dates )  LC recommended to the [facility] Nila Nephew to set up a DEBP so mom could pump.  LC explained to mom is she get any EBM to feed that to the baby for supplement.  Per Dr. Ronalee Red baby needs to stool prior to D/C . Mom aware.    Maternal Data Has patient been taught Hand Expression?: Yes(LC reviewed with mom and she was able to demo back with several drops noted )  Feeding Feeding Type: (per Aunt baby last efd ta 0930 ) Nipple Type: Slow - flow  LATCH Score                   Interventions Interventions: Breast feeding basics reviewed;Shells  Lactation Tools Discussed/Used WIC Program: Yes   Consult Status Consult Status: Complete Date: 06/17/18    Kathrin Greathouse 06/17/2018, 12:00 PM

## 2018-06-17 NOTE — Discharge Summary (Signed)
Obstetrics Discharge Summary OB/GYN Faculty Practice   Patient Name: Helen Curtis DOB: [birth year] MRN: [id number]  Date of admission: 06/14/2018 Delivering MD: Glenice Bow   Date of discharge: 06/17/2018  Admitting diagnosis: post-dates pregnancy Intrauterine pregnancy: [redacted]w[redacted]d    Secondary diagnosis:   Active Problems: Post-dates pregnancy                          Discharge diagnosis: Term Pregnancy Delivered                                            Postpartum procedures: None Complications: vacuum-assisted delivery (prolonged 2nd stage, deep variables with pushing)  2nd degree laceration  Outpatient Follow-Up: PP visit with Nexplanon insertion scheduled 07/15/18  Hospital course: Helen Schambergeris a 28y.o. 449w3dho was admitted for induction of labor for post-dates pregnancy. Her pregnancy was complicated by same. Her labor course was notable for induction with FB, cytotec, pitocin, AROM with moderate meconium, epidural placement. Delivery was complicated by vacuum-assisted delivery, repaired 2nd degree laceration. Please see delivery/op note for additional details. Her postpartum course was uncomplicated. She was breastfeeding without difficulty. By day of discharge, she was passing flatus, urinating, eating and drinking without difficulty. Her pain was well-controlled, and she was discharged home with Ibuprofen. She will follow-up in clinic in 4 weeks.   Physical exam        Vitals:   06/15/18 1345 06/15/18 1734 06/15/18 2150 06/16/18 0430  BP: 115/65 (!) 121/59 122/60 (!) 104/55  Pulse: 85 84 82 71  Resp: _0 Temp: 97.7 F (36.5 C) 98.3 F (36.8 C) 98.1 F (36.7 C) 98.3 F (36.8 C)  TempSrc: Oral Oral Oral Oral  Weight:      Height:       Blood pressure (!) 108/59, pulse 62, temperature 98 F (36.7 C), temperature source Oral, resp. rate 16, height _1  (1.702 m), weight 109.6 kg, last menstrual period 08/29/2017, unknown if  currently breastfeeding.   General: Alert, cooperative, no distress Lochia: appropriate Uterine Fundus: firm Incision: N/A DVT Evaluation: No evidence of DVT seen on physical exam. No cords or calf tenderness. No significant calf/ankle edema.  Labs: RecentLabs       Lab Results  Component Value Date   WBC 6.1 06/14/2018   HGB 10.1 (L) 06/14/2018   HCT 31.9 (L) 06/14/2018   MCV 75.1 (L) 06/14/2018   PLT 190 06/14/2018     CMP Latest Ref Rng & Units 11/12/2017  Glucose 70 - 99 mg/dL 86  BUN 6 - 20 mg/dL 10  Creatinine 0.44 - 1.00 mg/dL 0.59  Sodium 135 - 145 mmol/L 135  Potassium 3.5 - 5.1 mmol/L 3.8  Chloride 98 - 111 mmol/L 104  CO2 22 - 32 mmol/L 25  Calcium 8.9 - 10.3 mg/dL 8.9  Total Protein 6.0 - 8.3 g/dL -  Total Bilirubin 0.3 - 1.2 mg/dL -  Alkaline Phos 39 - 117 U/L -  AST 0 - 37 U/L -  ALT 0 - 35 U/L -    Discharge instructions: Per After Visit Summary and "Baby and Me Booklet"  After visit meds:  Allergies as of 06/16/2018   No Known Allergies        Medication List    TAKE these medications   acetaminophen 325 MG tablet Commonly  known as:  TYLENOL Take 650 mg by mouth every 6 (six) hours as needed for headache.   Blood Pressure Monitor Kit Apply 1 each topically 1 day or 1 dose for 1 dose.   cyclobenzaprine 10 MG tablet Commonly known as:  FLEXERIL Take 1 tablet (10 mg total) by mouth 2 (two) times daily as needed for muscle spasms.   ibuprofen 600 MG tablet Commonly known as:  ADVIL Take 1 tablet (600 mg total) by mouth every 6 (six) hours.   Vitafol Gummies 3.33-0.333-34.8 MG Chew Chew 3 tablets by mouth daily before breakfast.       Postpartum contraception:  Nexplanon - outpatient at [facility] visit Diet: Routine Diet Activity: Advance as tolerated. Pelvic rest for 6 weeks.   Follow-up Appt:       Future Appointments  Date Time Provider [place]  07/15/2018  1:00 PM Lajean Manes, CNM [facility] None    Follow-up Visit:No follow-ups on file.  Please schedule this patient for Postpartum visit in: 4 weeks with the following provider: Any provider Low risk pregnancy complicated by: none Delivery mode:  SVD Anticipated Birth Control: Depo PP Procedures needed: none  Schedule Integrated BH visit: no  Newborn Data: Live born female  Birth Weight: 9 lb 3.8 oz (4190 g) APGAR: 8, 9  Newborn Delivery   Birth date/time:  06/15/2018 09:40:00 Delivery type:  Vaginal, Vacuum (Extractor)    Baby Feeding: Breast Disposition:home with mother     Helen Curtis I, NP 06/17/2018 9:55 AM

## 2018-06-17 NOTE — Progress Notes (Signed)
Please see update discharge summary.  Helen Curtis I, NP 06/17/2018 10:18 AM

## 2018-07-15 ENCOUNTER — Ambulatory Visit: Payer: Medicaid Other | Admitting: Certified Nurse Midwife

## 2018-07-20 ENCOUNTER — Ambulatory Visit: Payer: Medicaid Other

## 2018-07-22 ENCOUNTER — Telehealth: Payer: Medicaid Other | Admitting: Advanced Practice Midwife

## 2018-07-30 ENCOUNTER — Encounter: Payer: Self-pay | Admitting: Advanced Practice Midwife

## 2018-07-30 ENCOUNTER — Telehealth: Payer: Self-pay | Admitting: Advanced Practice Midwife

## 2019-06-10 ENCOUNTER — Emergency Department (HOSPITAL_COMMUNITY): Payer: Medicaid Other

## 2019-06-10 ENCOUNTER — Encounter: Payer: Self-pay | Admitting: Emergency Medicine

## 2019-06-10 ENCOUNTER — Emergency Department (HOSPITAL_COMMUNITY)
Admission: EM | Admit: 2019-06-10 | Discharge: 2019-06-10 | Disposition: A | Discharge status: Home / Self Care | Payer: Medicaid Other | Attending: Emergency Medicine | Admitting: Emergency Medicine

## 2019-06-10 DIAGNOSIS — Z79899 Other long term (current) drug therapy: Secondary | ICD-10-CM | POA: Insufficient documentation

## 2019-06-10 DIAGNOSIS — R0789 Other chest pain: Secondary | ICD-10-CM | POA: Diagnosis present

## 2019-06-10 LAB — CBC
HCT: 41.3 % (ref 36.0–46.0)
Hemoglobin: 13.2 g/dL (ref 12.0–15.0)
MCH: 27.6 pg (ref 26.0–34.0)
MCHC: 32 g/dL (ref 30.0–36.0)
MCV: 86.2 fL (ref 80.0–100.0)
Platelets: 276 10*3/uL (ref 150–400)
RBC: 4.79 MIL/uL (ref 3.87–5.11)
RDW: 14.2 % (ref 11.5–15.5)
WBC: 5 10*3/uL (ref 4.0–10.5)
nRBC: 0 % (ref 0.0–0.2)

## 2019-06-10 LAB — BASIC METABOLIC PANEL
Anion gap: 11 (ref 5–15)
BUN: 9 mg/dL (ref 6–20)
CO2: 25 mmol/L (ref 22–32)
Calcium: 9.4 mg/dL (ref 8.9–10.3)
Chloride: 102 mmol/L (ref 98–111)
Creatinine, Ser: 0.77 mg/dL (ref 0.44–1.00)
GFR calc Af Amer: >60 mL/min (ref >60)
GFR calc non Af Amer: >60 mL/min (ref >60)
Glucose, Bld: 91 mg/dL (ref 70–99)
Potassium: 3.7 mmol/L (ref 3.5–5.1)
Sodium: 138 mmol/L (ref 135–145)

## 2019-06-10 LAB — TROPONIN I (HIGH SENSITIVITY): Troponin I (High Sensitivity): <2 ng/L (ref <18)

## 2019-06-10 LAB — I-STAT BETA HCG BLOOD, ED (MC, WL, AP ONLY): I-stat hCG, quantitative: <5 m[IU]/mL (ref <5)

## 2019-06-10 MED ORDER — NAPROXEN 250 MG PO TABS
500.0000 mg | ORAL_TABLET | Freq: Once | ORAL | Status: AC
  Administered 2019-06-10: 500 mg via ORAL
  Filled 2019-06-10: qty 2

## 2019-06-10 MED ORDER — LIDOCAINE 5 % EX PTCH
1.0000 | MEDICATED_PATCH | CUTANEOUS | Status: DC
  Administered 2019-06-10: 1 via TRANSDERMAL
  Filled 2019-06-10: qty 1

## 2019-06-10 MED ORDER — SODIUM CHLORIDE 0.9% FLUSH: 3.0000 mL | Freq: Once | INTRAVENOUS | Status: DC

## 2019-06-10 MED ORDER — METHOCARBAMOL 500 MG PO TABS
500.0000 mg | ORAL_TABLET | Freq: Once | ORAL | Status: AC
  Administered 2019-06-10: 500 mg via ORAL
  Filled 2019-06-10: qty 1

## 2019-06-10 MED ORDER — NAPROXEN 500 MG PO TABS
500.0000 mg | ORAL_TABLET | Freq: Two times a day (BID) | ORAL | 0 refills | Status: DC
Start: 2019-06-10 — End: 2019-11-15

## 2019-06-10 MED ORDER — METHOCARBAMOL 500 MG PO TABS
500.0000 mg | ORAL_TABLET | Freq: Two times a day (BID) | ORAL | 0 refills | Status: DC
Start: 2019-06-10 — End: 2019-11-15

## 2019-06-10 NOTE — ED Provider Notes (Signed)
[facility] EMERGENCY DEPARTMENT Provider Note   CSN: [id number] Arrival date & time: 06/10/19  2202     History Chief Complaint  Patient presents with  . Chest Pain    Helen Curtis is a 29 y.o. female.  HPI  Helen Curtis is a 29 y.o. female with a history of heart murmur, who presents to the emergency department for evaluation of chest pain.  She reports pain started about 4 days ago was initially present on the right side of her chest, she states over the past 2 days pain has moved to the center and left side of her chest.  She reports pain is primarily worse with movement.  She reports she finds it difficult to pick up her son, he is about 60-year-old and she states she has to carry him around frequently as he is not yet walking.  She reports that when pain started she felt it more when she took a deep breath, but denies associated shortness of breath, pain is nonradiating.  No numbness weakness or tingling in her arms.  No associated headaches.  No diaphoresis, chills or syncope.  No nausea, vomiting or abdominal pain.  No associated leg pain or swelling.  No cough or fever.  No history of DVT/PE, not on OCPs, no recent long distance travel or surgery.  No history of tobacco use.  No personal or family history of CAD.   Past Medical History:  Diagnosis Date  . 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 History of chicken pox   . History of chlamydia   . No pertinent past medical history     Patient Active Problem List   Diagnosis Date Noted  . Post-dates pregnancy 06/14/2018  . GBS (group B Streptococcus carrier), +RV culture, currently pregnant 06/03/2018  . Back pain affecting pregnancy in third trimester 03/18/2018  . Supervision of normal pregnancy 11/25/2017  . Obesity 01/12/2011    Past Surgical History:  Procedure Laterality Date  . THERAPEUTIC ABORTION       OB History    Gravida  3   Para  2   Term  2   Preterm  0   AB  1   Living  2     SAB  0   TAB  1   Ectopic  0   Multiple  0   Live Births  2           Family History  Problem Relation Age of Onset  . Hypertension Mother   . Seizures Mother   . Hypertension Maternal Grandmother   . Diabetes Maternal Grandmother   . Cancer Paternal Grandmother   . Alzheimer's disease Paternal Grandmother   . Cancer Paternal Grandfather   . Diabetes Maternal Grandfather   . Anesthesia problems Neg Hx     Social History   Tobacco Use  . Smoking status: Never Smoker  . Smokeless tobacco: Never Used  Substance Use Topics  . Alcohol use: Not Currently  . Drug use: No    Home Medications Prior to Admission medications   Medication Sig Start Date End Date Taking? Authorizing Provider  Blood Pressure Monitor KIT Apply 1 each topically 1 day or 1 dose for 1 dose. Patient not taking: Reported on 06/10/2019 05/20/18 05/21/18  Constant, Peggy, MD  cyclobenzaprine (FLEXERIL) 10 MG tablet Take 1 tablet (10 mg total) by mouth 2 (two) times daily as needed for muscle spasms. Patient  not taking: Reported on 06/10/2019 03/18/18   Laury Deep, CNM  ibuprofen (ADVIL) 600 MG tablet Take 1 tablet (600 mg total) by mouth every 6 (six) hours. Patient not taking: Reported on 06/10/2019 06/16/18   Luvenia Redden, PA-C  methocarbamol (ROBAXIN) 500 MG tablet Take 1 tablet (500 mg total) by mouth 2 (two) times daily. 06/10/19   Jacqlyn Larsen, PA-C  naproxen (NAPROSYN) 500 MG tablet Take 1 tablet (500 mg total) by mouth 2 (two) times daily. 06/10/19   Jacqlyn Larsen, PA-C  Prenatal Vit-Fe Phos-FA-Omega (VITAFOL GUMMIES) 3.33-0.333-34.8 MG CHEW Chew 3 tablets by mouth daily before breakfast. Patient not taking: Reported on 06/10/2019 02/17/18   Shelly Bombard, MD    Allergies    Patient has no known allergies.  Review of Systems   Review of Systems  Constitutional: Negative for chills and fever.  HENT: Negative.   Respiratory: Negative for cough and  shortness of breath.   Cardiovascular: Positive for chest pain. Negative for palpitations and leg swelling.  Gastrointestinal: Negative for abdominal pain, diarrhea, nausea and vomiting.  Genitourinary: Negative for dysuria and frequency.  Musculoskeletal: Positive for arthralgias. Negative for back pain and myalgias.  Skin: Negative for color change and rash.  Neurological: Negative for dizziness, syncope and light-headedness.  All other systems reviewed and are negative  Physical Exam Updated Vital Signs BP (!) 118/53   Pulse 72   Temp 98.6 F (37 C) (Oral)   Resp 16   Ht 5' 7"  (1.702 m)   Wt 95.3 kg   LMP 06/02/2019 (Approximate)   SpO2 99%   BMI 32.89 kg/m   Physical Exam  Vitals and nursing note reviewed.  Constitutional:  General: She is not in acute distress. Appearance: She is well-developed. She is not ill-appearing or diaphoretic.  Comments: Well-appearing and in no distress  HENT:  Head: Normocephalic and atraumatic.  Eyes:  General:  Right eye: No discharge.  Left eye: No discharge.  Pupils: Pupils are equal, round, and reactive to light.  Neck:  Vascular: No JVD.  Trachea: No tracheal deviation.  Cardiovascular:  Rate and Rhythm: Normal rate and regular rhythm.  Pulses:  Radial pulses are 2+ on the right side and 2+ on the left side.  Heart sounds: Normal heart sounds. No murmur. No friction rub. No gallop.  Pulmonary:  Effort: Pulmonary effort is normal. No respiratory distress.  Breath sounds: Normal breath sounds. No wheezing or rales.  Comments: Respirations equal and unlabored, patient able to speak in full sentences, lungs clear to auscultation bilaterally Chest:  Chest wall: Tenderness present.  Comments: Pain is consistently reproducible with pain over the left upper chest wall, no overlying skin changes or palpable deformity Abdominal:  General: Bowel sounds are normal. There is no distension.  Palpations: Abdomen is soft. There is no mass.    Tenderness: There is no abdominal tenderness. There is no guarding.  Musculoskeletal:  General: No deformity.  Cervical back: Neck supple.  Right lower leg: No edema.  Left lower leg: No edema.  Comments: Bilateral lower extremities are warm and well perfused without edema  Skin:  General: Skin is warm and dry.  Capillary Refill: Capillary refill takes less than 2 seconds.  Neurological:  Mental Status: She is alert.  Coordination: Coordination normal.  Comments: Speech is clear, able to follow commands Moves extremities without ataxia, coordination intact  Psychiatric:  Mood and Affect: Mood normal.  Behavior: Behavior normal.    ED Results / Procedures /  Treatments   Labs (all labs ordered are listed, but only abnormal results are displayed) Labs Reviewed  BASIC METABOLIC PANEL  CBC  I-STAT BETA HCG BLOOD, ED ([facility], [facility], [facility] ONLY)  TROPONIN I (HIGH SENSITIVITY)    EKG EKG Interpretation  Date/Time:  Thursday Jun 10 2019 09:08:18 EDT Ventricular Rate:  78 PR Interval:  168 QRS Duration: 78 QT Interval:  366 QTC Calculation: 417 R Axis:   117 Text Interpretation: Normal sinus rhythm Right axis deviation Nonspecific T wave abnormality Abnormal ECG Confirmed by Virgel Manifold [phone]) on 06/10/2019 11:35:27 AM   Radiology DG Chest 2 View  Result Date: 06/10/2019 CLINICAL DATA:  Right-sided chest pain. EXAM: CHEST - 2 VIEW COMPARISON:  08/30/2016. FINDINGS: Mediastinum and hilar structures normal. Lungs are clear. No pleural effusion or pneumothorax. Heart size normal. No acute bony abnormality. IMPRESSION: No acute cardiopulmonary disease. Electronically Signed   By: Marcello Moores  Register   On: 06/10/2019 09:35    Procedures Procedures (including critical care time)  Medications Ordered in ED Medications  sodium chloride flush (NS) 0.9 % injection 3 mL (has no administration in time range)  naproxen (NAPROSYN) tablet 500 mg (has no administration in time range)   methocarbamol (ROBAXIN) tablet 500 mg (has no administration in time range)  lidocaine (LIDODERM) 5 % 1 patch (has no administration in time range)    ED Course  I have reviewed the triage vital signs and the nursing notes.  Pertinent labs & imaging results that were available during my care of the patient were reviewed by me and considered in my medical decision making (see chart for details).    MDM Rules/Calculators/A&P                     Patient presents to the emergency department with chest pain. Patient nontoxic appearing, in no apparent distress, vitals without significant abnormality. Fairly benign physical exam.   DDX: ACS, pulmonary embolism, dissection, pneumothorax, pneumonia, arrhythmia, severe anemia, MSK, GERD, anxiety. Evaluation initiated with labs, EKG, and CXR. Patient on cardiac monitor.   CBC: No leukocytosis, normal hemoglobin BMP: No acute electrolyte derangements, normal renal function Troponin: <2, given pain has been present and constant for the past 4 days do not think that delta troponin is indicated EKG: Normal sinus rhythm without ischemic changes CXR:  Negative, without infiltrate, effusion, pneumothorax, or fracture/dislocation.   EKG without obvious acute ischemia, delta troponin negative, doubt ACS. Patient is low risk wells, PERC negative, doubt pulmonary embolism. Pain is not a tearing sensation, symmetric pulses, no widening of mediastinum on CXR, doubt dissection. Cardiac monitor reviewed, no notable arrhythmias or tachycardia.  Chest pain consistently reproducible with palpation and movement, high suspicion for musculoskeletal etiology.  Pain improved here in the ED.  Patient has appeared hemodynamically stable throughout ER visit and appears safe for discharge with close PCP/cardiology follow up. I discussed results, treatment plan, need for PCP follow-up, and return precautions with the patient. Provided opportunity for questions, patient  confirmed understanding and is in agreement with plan.   Final Clinical Impression(s) / ED Diagnoses Final diagnoses:  Atypical chest pain    Rx / DC Orders ED Discharge Orders         Ordered    methocarbamol (ROBAXIN) 500 MG tablet  2 times daily     06/10/19 1406    naproxen (NAPROSYN) 500 MG tablet  2 times daily     06/10/19 1406  Jacqlyn Larsen, PA-C 06/10/19 1414    Virgel Manifold, MD 06/11/19 [phone]

## 2019-06-10 NOTE — ED Triage Notes (Signed)
Pt endorses pain that began in her R chest and has now moved to her L chest/clavicle area. She states that the pain limits her movement, denies any diff activity prior to pain starting. Denies cough or sob. A/ox4, resp e/u, nad.

## 2019-06-10 NOTE — Discharge Instructions (Signed)
Today is reassuring and I suspect this pain is musculoskeletal, treat with naproxen, Tylenol and you can use prescribed muscle relaxer as needed.  This can cause drowsiness, do not take before driving.  You can also use over-the-counter lidocaine patches in this area.  If symptoms or not improving over the next week please follow-up with your PCP.  If you develop worsening chest pain, shortness of breath feel lightheaded or dizzy, develop fevers or any other new or concerning symptoms return to the ED for reevaluation.

## 2019-06-10 NOTE — ED Notes (Signed)
Patient verbalizes understanding of discharge instructions. Opportunity for questioning and answers were provided. Armband removed by staff, pt discharged from ED and ambulated to lobby to return home.   

## 2019-11-11 ENCOUNTER — Telehealth: Payer: Self-pay | Admitting: *Deleted

## 2019-11-11 ENCOUNTER — Ambulatory Visit (HOSPITAL_COMMUNITY)
Admission: EM | Admit: 2019-11-11 | Discharge: 2019-11-11 | Discharge status: Against Medical Advice | Payer: Medicaid Other

## 2019-11-11 ENCOUNTER — Emergency Department (HOSPITAL_COMMUNITY)
Admission: EM | Admit: 2019-11-11 | Discharge: 2019-11-11 | Disposition: A | Discharge status: Home / Self Care | Payer: Medicaid Other | Attending: Emergency Medicine | Admitting: Emergency Medicine

## 2019-11-11 ENCOUNTER — Encounter (HOSPITAL_COMMUNITY): Payer: Self-pay

## 2019-11-11 ENCOUNTER — Other Ambulatory Visit: Payer: Self-pay

## 2019-11-11 DIAGNOSIS — S63502A Unspecified sprain of left wrist, initial encounter: Secondary | ICD-10-CM

## 2019-11-11 DIAGNOSIS — X58XXXA Exposure to other specified factors, initial encounter: Secondary | ICD-10-CM | POA: Diagnosis not present

## 2019-11-11 DIAGNOSIS — M25532 Pain in left wrist: Secondary | ICD-10-CM | POA: Diagnosis present

## 2019-11-11 MED ORDER — DICLOFENAC SODIUM 1 % EX GEL: 2.0000 g | Freq: Four times a day (QID) | CUTANEOUS | 0 refills | Status: DC | PRN

## 2019-11-11 NOTE — Telephone Encounter (Signed)
Pt called needing clearance to return to work letter.  RNCM reviewed chart to find that pt was seen earlier today in ED and given letter to return on 11/01 with restrictions outlined.  When pt presented it to workplace, she was told she needed more detail.  RNCM reached out to [facility] for assistance.  [facility] states this is something they can help pt with, and that she can be seen by appointment and/or walk-in but she will have to make the call.

## 2019-11-11 NOTE — Discharge Instructions (Signed)
You were seen in the emergency room today with pain in the left wrist likely from tendon inflammation from repetitive use.  I am clearing you to go back to work on Monday but will advise that you use a wrist wrap and/or brace as needed for discomfort and take frequent breaks.  Please do not lift anything over 20 pounds.  I would like for you to follow-up both with your primary care doctor as well as an orthopedist.

## 2019-11-11 NOTE — ED Provider Notes (Signed)
Emergency Department Provider Note   I have reviewed the triage vital signs and the nursing notes.   HISTORY  Chief Complaint Wrist Pain   HPI Helen Curtis is a 29 y.o. female presents to the emergency department with left wrist pain in the setting of repetitive use at work.  She initially was following along as part of a Workmen's Comp. claim and has been following with an orthopedist.  She was made aware by her job that this will no longer be covered by [employer]. and so cannot follow-up further with her orthopedic team.  She has been on a prescription medication for non-opiate pain and until 1 week ago was using a wrist brace. Her pain is improved and ROM of the wrist is better than before. She denies any fever or joint redness. She is inquiring about return to work timeframe and restrictions. When pain is present it is over the wrist posteriorly and worse with repetitive movement.    Past Medical History:  Diagnosis Date  . 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 History of chicken pox   . History of chlamydia   . No pertinent past medical history     Patient Active Problem List   Diagnosis Date Noted  . Post-dates pregnancy 06/14/2018  . GBS (group B Streptococcus carrier), +RV culture, currently pregnant 06/03/2018  . Back pain affecting pregnancy in third trimester 03/18/2018  . Supervision of normal pregnancy 11/25/2017  . Obesity 01/12/2011    Past Surgical History:  Procedure Laterality Date  . THERAPEUTIC ABORTION      Allergies Patient has no known allergies.  Family History  Problem Relation Age of Onset  . Hypertension Mother   . Seizures Mother   . Hypertension Maternal Grandmother   . Diabetes Maternal Grandmother   . Cancer Paternal Grandmother   . Alzheimer's disease Paternal Grandmother   . Cancer Paternal Grandfather   . Diabetes Maternal Grandfather   . Anesthesia problems Neg Hx     Social  History Social History   Tobacco Use  . Smoking status: Never Smoker  . Smokeless tobacco: Never Used  Vaping Use  . Vaping Use: Never used  Substance Use Topics  . Alcohol use: Not Currently  . Drug use: No    Review of Systems  Constitutional: No fever/chills Musculoskeletal: Positive wrist pain with movement.  Skin: Negative for rash. Neurological: Negative for weakness or numbness.  ____________________________________________   PHYSICAL EXAM:  VITAL SIGNS: ED Triage Vitals  Enc Vitals Group     BP 11/11/19 0846 106/81     Pulse Rate 11/11/19 0846 67     Resp 11/11/19 0846 14     Temp 11/11/19 0846 98.5 F (36.9 C)     Temp Source 11/11/19 0846 Oral     SpO2 11/11/19 0846 98 %     Weight --      Height 11/11/19 0846 5\' 7"  (1.702 m)   Constitutional: Alert and oriented. Well appearing and in no acute distress. Eyes: Conjunctivae are normal.  Head: Atraumatic. Nose: No congestion/rhinnorhea. Mouth/Throat: Mucous membranes are moist.  Neck: No stridor.  Cardiovascular:  Good peripheral circulation.  Respiratory: Normal respiratory effort. Gastrointestinal: No distention.  Musculoskeletal: No wrist deformity or focal tenderness on exam.  No scaphoid tenderness.  Full range of motion without pain or difficulty.  Neurologic:  Normal strength and sensation over the left hand and forearm.  Skin:  Skin is  warm, dry and intact. No rash noted. No joint swelling or redness.   ____________________________________________  RADIOLOGY  None  ____________________________________________   PROCEDURES  Procedure(s) performed:   Procedures  None  ____________________________________________   INITIAL IMPRESSION / ASSESSMENT AND PLAN / ED COURSE  Pertinent labs & imaging results that were available during my care of the patient were reviewed by me and considered in my medical decision making (see chart for details).   Patient presents to the emergency  department with left wrist pain.  She has full range of motion with no focal bony tenderness.  My suspicion for underlying occult fracture is exceedingly low and I do not plan on imaging at this time.  Discussed compression, ice, and elevation strategies at home along with NSAID use. Will provide contact info for PCP and Ortho as an outpatient. After discussion and exam feel that patient can return to work with frequent breaks, allow for wrist compression/brace, and do not life greater than 20 lbs. Patient can return Monday and further restrictions or extensions will need to be done by PCP or Ortho. Voltaren gel called in to patient's pharmacy.    ____________________________________________  FINAL CLINICAL IMPRESSION(S) / ED DIAGNOSES  Final diagnoses:  Sprain of left wrist, initial encounter    NEW OUTPATIENT MEDICATIONS STARTED DURING THIS VISIT:  New Prescriptions   DICLOFENAC SODIUM (VOLTAREN) 1 % GEL    Apply 2 g topically 4 (four) times daily as needed.    Note:  This document was prepared using Dragon voice recognition software and may include unintentional dictation errors.  Alona Bene, MD, [facility] Emergency Medicine    Cheridan Kibler, Arlyss Repress, MD 11/11/19 [phone]

## 2019-11-11 NOTE — ED Triage Notes (Signed)
Pt presents with ongoing Left wrist irritation. Pt reports injury to area approx 10/12, seeing a orthopedic for said injury, they recently removed her from her wrist brace and pt reports "a twinge" with movement. Pt able to move wrist in all directions. Denies any pain at this time.

## 2019-11-15 ENCOUNTER — Encounter (HOSPITAL_COMMUNITY): Payer: Self-pay

## 2019-11-15 ENCOUNTER — Ambulatory Visit (HOSPITAL_COMMUNITY)
Admission: EM | Admit: 2019-11-15 | Discharge: 2019-11-15 | Disposition: A | Discharge status: Home / Self Care | Payer: Medicaid Other

## 2019-11-15 ENCOUNTER — Other Ambulatory Visit: Payer: Self-pay

## 2019-11-15 DIAGNOSIS — M25532 Pain in left wrist: Secondary | ICD-10-CM

## 2019-11-15 NOTE — ED Triage Notes (Signed)
Pt in with c/o left wrist pain that started about 2 weeks ago. States that she was lifting and pushing when she first noticed pain  States that she has been taking muscle relaxer and wearing a brace with relief  Denies swelling or bruising

## 2019-11-15 NOTE — Discharge Instructions (Signed)
I feel it is safe to return to work You  can wear soft brace as needed Medication as needed for pain Follow up as needed for continued or worsening symptoms

## 2019-11-15 NOTE — ED Provider Notes (Signed)
[place]    CSN: [id number] Arrival date & time: 11/15/19  0815      History   Chief Complaint Chief Complaint  Patient presents with  . Wrist Pain    HPI Helen Curtis is a 29 y.o. female.   Patient is a 29 year old female who presents today with wrist pain.  She was seen for this on 11/11/2019 in the ER.  First noticed the pain approximately 2 weeks ago when she was lifting and pushing at work.  Has been taking medication as prescribed to include NSAIDs and muscle relaxers with relief.  She is also wearing a brace.  Her symptoms have improved.  She is here today for clearance to go to work.      Past Medical History:  Diagnosis Date  . 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 History of chicken pox   . History of chlamydia   . No pertinent past medical history     Patient Active Problem List   Diagnosis Date Noted  . Post-dates pregnancy 06/14/2018  . GBS (group B Streptococcus carrier), +RV culture, currently pregnant 06/03/2018  . Back pain affecting pregnancy in third trimester 03/18/2018  . Supervision of normal pregnancy 11/25/2017  . Obesity 01/12/2011    Past Surgical History:  Procedure Laterality Date  . THERAPEUTIC ABORTION      OB History    Gravida  3   Para  2   Term  2   Preterm  0   AB  1   Living  2     SAB  0   TAB  1   Ectopic  0   Multiple  0   Live Births  2            Home Medications    Prior to Admission medications   Medication Sig Start Date End Date Taking? Authorizing Provider  Blood Pressure Monitor KIT Apply 1 each topically 1 day or 1 dose for 1 dose. Patient not taking: Reported on 06/10/2019 05/20/18 11/15/19  Constant, Peggy, MD    Family History Family History  Problem Relation Age of Onset  . Hypertension Mother   . Seizures Mother   . Hypertension Maternal Grandmother   . Diabetes Maternal Grandmother   . Cancer Paternal Grandmother   . Alzheimer's  disease Paternal Grandmother   . Cancer Paternal Grandfather   . Diabetes Maternal Grandfather   . Anesthesia problems Neg Hx     Social History Social History   Tobacco Use  . Smoking status: Never Smoker  . Smokeless tobacco: Never Used  Vaping Use  . Vaping Use: Never used  Substance Use Topics  . Alcohol use: Not Currently  . Drug use: No     Allergies   Patient has no known allergies.   Review of Systems Review of Systems   Physical Exam Triage Vital Signs ED Triage Vitals  Enc Vitals Group     BP 11/15/19 0842 106/62     Pulse Rate 11/15/19 0839 66     Resp 11/15/19 0839 19     Temp 11/15/19 0843 98.9 F (37.2 C)     Temp Source 11/15/19 0839 Oral     SpO2 11/15/19 0839 98 %     Weight --      Height --      Head Circumference --      Peak Flow --  Pain Score 11/15/19 0833 2     Pain Loc --      Pain Edu? --      Excl. in [place]? --    No data found.  Updated Vital Signs BP 106/62   Pulse 66   Temp 98.9 F (37.2 C) (Oral)   Resp 19   LMP 08/25/2019 (Approximate)   SpO2 98%   Breastfeeding No   Visual Acuity Right Eye Distance:   Left Eye Distance:   Bilateral Distance:    Right Eye Near:   Left Eye Near:    Bilateral Near:     Physical Exam Vitals and nursing note reviewed.  Constitutional:      General: She is not in acute distress.    Appearance: Normal appearance. She is not ill-appearing, toxic-appearing or diaphoretic.  HENT:     Head: Normocephalic.     Nose: Nose normal.  Eyes:     Conjunctiva/sclera: Conjunctivae normal.  Pulmonary:     Effort: Pulmonary effort is normal.  Musculoskeletal:        General: No swelling or tenderness. Normal range of motion.     Cervical back: Normal range of motion.  Skin:    General: Skin is warm and dry.     Findings: No rash.  Neurological:     Mental Status: She is alert.  Psychiatric:        Mood and Affect: Mood normal.      UC Treatments / Results  Labs (all labs  ordered are listed, but only abnormal results are displayed) Labs Reviewed - No data to display  EKG   Radiology No results found.  Procedures Procedures (including critical care time)  Medications Ordered in UC Medications - No data to display  Initial Impression / Assessment and Plan / UC Course  I have reviewed the triage vital signs and the nursing notes.  Pertinent labs & imaging results that were available during my care of the patient were reviewed by me and considered in my medical decision making (see chart for details).     Left wrist pain Patient's symptoms have improved with conservative treatment.  She is cleared to go back to work. She can wear the brace and use medication as needed in the future Follow up as needed for continued or worsening symptoms  Final Clinical Impressions(s) / UC Diagnoses   Final diagnoses:  Left wrist pain     Discharge Instructions     I feel it is safe to return to work You  can wear soft brace as needed Medication as needed for pain Follow up as needed for continued or worsening symptoms     ED Prescriptions    None     PDMP not reviewed this encounter.   Orvan July, NP 11/15/19 [phone]

## 2019-12-13 ENCOUNTER — Encounter (HOSPITAL_COMMUNITY): Payer: Self-pay | Admitting: Emergency Medicine

## 2019-12-13 ENCOUNTER — Emergency Department (HOSPITAL_COMMUNITY)
Admission: EM | Admit: 2019-12-13 | Discharge: 2019-12-13 | Disposition: A | Discharge status: Home / Self Care | Payer: No Typology Code available for payment source | Attending: Emergency Medicine | Admitting: Emergency Medicine

## 2019-12-13 ENCOUNTER — Emergency Department (HOSPITAL_COMMUNITY): Payer: No Typology Code available for payment source

## 2019-12-13 ENCOUNTER — Other Ambulatory Visit: Payer: Self-pay

## 2019-12-13 DIAGNOSIS — W208XXA Other cause of strike by thrown, projected or falling object, initial encounter: Secondary | ICD-10-CM | POA: Insufficient documentation

## 2019-12-13 DIAGNOSIS — S060X0A Concussion without loss of consciousness, initial encounter: Secondary | ICD-10-CM | POA: Insufficient documentation

## 2019-12-13 DIAGNOSIS — S0990XA Unspecified injury of head, initial encounter: Secondary | ICD-10-CM

## 2019-12-13 DIAGNOSIS — Y9389 Activity, other specified: Secondary | ICD-10-CM | POA: Diagnosis not present

## 2019-12-13 DIAGNOSIS — Y990 Civilian activity done for income or pay: Secondary | ICD-10-CM | POA: Diagnosis not present

## 2019-12-13 MED ORDER — ONDANSETRON 4 MG PO TBDP: 4.0000 mg | ORAL_TABLET | Freq: Three times a day (TID) | ORAL | 0 refills | Status: DC | PRN

## 2019-12-13 MED ORDER — IBUPROFEN 400 MG PO TABS
600.0000 mg | ORAL_TABLET | Freq: Once | ORAL | Status: AC
  Administered 2019-12-13: 600 mg via ORAL
  Filled 2019-12-13: qty 1

## 2019-12-13 NOTE — ED Provider Notes (Signed)
[facility] EMERGENCY DEPARTMENT Provider Note   CSN: [id number] Arrival date & time: 12/13/19  1802     History Chief Complaint  Patient presents with  . Head Injury    Helen Curtis is a 29 y.o. female who presents to the ED today via EMS with complaint of head injury.  Patient was at work when she was lifting a large metal rod above her head to get totes out from an area when the metal rod fell and landed directly on the top of her head.  She denies any loss of consciousness however states she was stunned and felt very off balance afterwards.  She is also complaining of nausea.  EMS was immediately called from patient's work and brought to the ED.  Patient is alert and oriented x4 with a GCS of 15 on arrival.  She was not provided with any medication via EMS.  And she has not taken anything for pain prior to arrival.  She states that she has never had a concussion in the past.  She is not anticoagulated.  She denies any wounds, vomiting, confusion, unilateral weakness or numbness, speech difficulties, any other associated symptoms.   The history is provided by the patient and medical records.       Past Medical History:  Diagnosis Date  . 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 History of chicken pox   . History of chlamydia   . No pertinent past medical history     Patient Active Problem List   Diagnosis Date Noted  . Post-dates pregnancy 06/14/2018  . GBS (group B Streptococcus carrier), +RV culture, currently pregnant 06/03/2018  . Back pain affecting pregnancy in third trimester 03/18/2018  . Supervision of normal pregnancy 11/25/2017  . Obesity 01/12/2011    Past Surgical History:  Procedure Laterality Date  . THERAPEUTIC ABORTION       OB History    Gravida  3   Para  2   Term  2   Preterm  0   AB  1   Living  2     SAB  0   TAB  1   Ectopic  0   Multiple  0   Live Births  2            Family History  Problem Relation Age of Onset  . Hypertension Mother   . Seizures Mother   . Hypertension Maternal Grandmother   . Diabetes Maternal Grandmother   . Cancer Paternal Grandmother   . Alzheimer's disease Paternal Grandmother   . Cancer Paternal Grandfather   . Diabetes Maternal Grandfather   . Anesthesia problems Neg Hx     Social History   Tobacco Use  . Smoking status: Never Smoker  . Smokeless tobacco: Never Used  Vaping Use  . Vaping Use: Never used  Substance Use Topics  . Alcohol use: Not Currently  . Drug use: No    Home Medications Prior to Admission medications   Medication Sig Start Date End Date Taking? Authorizing Provider  ondansetron (ZOFRAN ODT) 4 MG disintegrating tablet Take 1 tablet (4 mg total) by mouth every 8 (eight) hours as needed for nausea or vomiting. 12/13/19   Eustaquio Maize, PA-C  Blood Pressure Monitor KIT Apply 1 each topically 1 day or 1 dose for 1 dose. Patient not taking: Reported on 06/10/2019 05/20/18 11/15/19  Constant, Vickii Chafe, MD    Allergies  Patient has no known allergies.  Review of Systems   Review of Systems  Constitutional: Negative for chills and fever.  Eyes: Positive for visual disturbance.  Gastrointestinal: Positive for nausea. Negative for vomiting.  Neurological: Positive for light-headedness and headaches. Negative for dizziness.    Physical Exam Updated Vital Signs BP 140/85 (BP Location: Left Arm)   Pulse 72   Temp 98.3 F (36.8 C) (Oral)   Resp 16   Ht 5' 7"  (1.702 m)   Wt 95.3 kg   SpO2 98%   BMI 32.91 kg/m   Physical Exam Vitals and nursing note reviewed.  Constitutional:      Appearance: She is not ill-appearing or diaphoretic.  HENT:     Head: Normocephalic.     Comments: No lacerations appreciated. + Hematoma to midline of scalp with TTP. No deformities appreciated.  No raccoon's sign or battle's sign Eyes:     Extraocular Movements: Extraocular movements intact.      Conjunctiva/sclera: Conjunctivae normal.     Pupils: Pupils are equal, round, and reactive to light.  Cardiovascular:     Rate and Rhythm: Normal rate and regular rhythm.  Pulmonary:     Effort: Pulmonary effort is normal.     Breath sounds: Normal breath sounds.  Musculoskeletal:     Cervical back: Normal range of motion. No tenderness.  Skin:    General: Skin is warm and dry.     Coloration: Skin is not jaundiced.  Neurological:     Mental Status: She is alert.     Comments: Alert and oriented to self, place, time and event.   Speech is fluent, clear without dysarthria or dysphasia.   Strength 5/5 in upper/lower extremities  Sensation intact in upper/lower extremities   Normal gait.  Negative Romberg. No pronator drift.  Normal finger-to-nose and feet tapping.  CN I not tested  CN II grossly intact visual fields bilaterally. Did not visualize posterior eye.   CN III, IV, VI PERRLA and EOMs intact bilaterally  CN V Intact sensation to sharp and light touch to the face  CN VII facial movements symmetric  CN VIII not tested  CN IX, X no uvula deviation, symmetric rise of soft palate  CN XI 5/5 SCM and trapezius strength bilaterally  CN XII Midline tongue protrusion, symmetric L/R movements      ED Results / Procedures / Treatments   Labs (all labs ordered are listed, but only abnormal results are displayed) Labs Reviewed - No data to display  EKG None  Radiology No results found.  Procedures Procedures (including critical care time)  Medications Ordered in ED Medications  ibuprofen (ADVIL) tablet 600 mg (has no administration in time range)    ED Course  I have reviewed the triage vital signs and the nursing notes.  Pertinent labs & imaging results that were available during my care of the patient were reviewed by me and considered in my medical decision making (see chart for details).    MDM Rules/Calculators/A&P                          29 year old  female who presents to the ED today after sustaining a head injury at work where a large piece of metal fell on top of her head causing her to be dazed, lightheaded with blurry vision and nausea.  Patient denies loss of consciousness.  She is not anticoagulated.  She has never had a concussion in the  past.  Has not taken anything for pain prior to arrival.  With EMS patient was alert and oriented x4 with a GCS of 15 and sent to the waiting room.  On arrival to the ED vitals are stable.  Patient is afebrile, nontachycardic nontachypneic and appears to be in no acute distress.  On exam patient has no focal neuro deficits today.  She has no signs of posterior head trauma.  She has no lacerations to the scalp that need repair.  She does have a hematoma to the midline of her scalp with surrounding tenderness palpation, no step-offs or deformities appreciated.  Patient ambulated back and forth in her room without difficulty.  I do suspect she is suffering from a mild concussion at this time.  I do not feel she needs imaging today.  Have instructed on brain rest.  Have instructed on ibuprofen and Tylenol as needed for pain as well as ice to her head given her hematoma.  Patient is a ride of ibuprofen prior to discharge.  Will discharge home with Zofran as well.  Information given for low our concussion clinic if symptoms persist.  Patient is in agreement with plan and stable for discharge.   This note was prepared using Dragon voice recognition software and may include unintentional dictation errors due to the inherent limitations of voice recognition software.  Final Clinical Impression(s) / ED Diagnoses Final diagnoses:  Injury of head, initial encounter  Concussion without loss of consciousness, initial encounter    Rx / DC Orders ED Discharge Orders         Ordered    ondansetron (ZOFRAN ODT) 4 MG disintegrating tablet  Every 8 hours PRN        12/13/19 2136           Discharge Instructions      Your symptoms are likely related to a concussion today. Please see attached for additional information. It is very important to allow your brain to rest - this includes sitting in a darkened room whenever possible and avoiding bright lights specifically from cell phones, TV, computers, tablets, etc.   I have provided you with a prescription for zofran to take as needed for nausea/vomiting. I would also recommend Ibuprofen and Tylenol as needed for pain and ice to your scalp to help with the pain.   Please follow up with your PCP regarding ED visit. You can also follow up with [facility] for further evaluation.   If you, your child, or a loved one you care for has suffered a head injury or potential concussion, we know how important it is to have access to the best advice and treatment. The [facility] is the only comprehensive, holistic concussion clinic in the [place], [place] area. You can speak with one of our concussion-trained staff members during our regular office hours, Monday - Thursday from 7:30 AM to 4:30 PM, and Fridays from 7:30 AM to 12:00 PM, by calling our Concussion Hotline: [phone].       Eustaquio Maize, PA-C 12/13/19 2151    Carmin Muskrat, MD 12/13/19 [phone]

## 2019-12-13 NOTE — Discharge Instructions (Signed)
Your symptoms are likely related to a concussion today. Please see attached for additional information. It is very important to allow your brain to rest - this includes sitting in a darkened room whenever possible and avoiding bright lights specifically from cell phones, TV, computers, tablets, etc.   I have provided you with a prescription for zofran to take as needed for nausea/vomiting. I would also recommend Ibuprofen and Tylenol as needed for pain and ice to your scalp to help with the pain.   Please follow up with your PCP regarding ED visit. You can also follow up with [facility] for further evaluation.   If you, your child, or a loved one you care for has suffered a head injury or potential concussion, we know how important it is to have access to the best advice and treatment. The [facility] is the only comprehensive, holistic concussion clinic in the [place], [place] area. You can speak with one of our concussion-trained staff members during our regular office hours, Monday - Thursday from 7:30 AM to 4:30 PM, and Fridays from 7:30 AM to 12:00 PM, by calling our Concussion Hotline: [phone].

## 2019-12-13 NOTE — ED Triage Notes (Signed)
Pt brought to ED by [facility] from work after she got hit on her head with a pice of metal, pt is AO x 4 GCS-15, c/o dizziness and blurred vision.

## 2019-12-29 ENCOUNTER — Ambulatory Visit (HOSPITAL_COMMUNITY)
Admission: EM | Admit: 2019-12-29 | Discharge: 2019-12-29 | Disposition: A | Discharge status: Home / Self Care | Payer: Medicaid Other | Attending: Family Medicine | Admitting: Family Medicine

## 2019-12-29 ENCOUNTER — Encounter (HOSPITAL_COMMUNITY): Payer: Self-pay

## 2019-12-29 ENCOUNTER — Other Ambulatory Visit: Payer: Self-pay

## 2019-12-29 DIAGNOSIS — Z20822 Contact with and (suspected) exposure to covid-19: Secondary | ICD-10-CM | POA: Diagnosis not present

## 2019-12-29 DIAGNOSIS — J029 Acute pharyngitis, unspecified: Secondary | ICD-10-CM | POA: Insufficient documentation

## 2019-12-29 LAB — POCT RAPID STREP A, ED / UC: Streptococcus, Group A Screen (Direct): NEGATIVE

## 2019-12-29 NOTE — ED Notes (Signed)
Patient updated on wait time and reassured we are making every effort to see patients in a timely manner. Patient understanding.

## 2019-12-29 NOTE — Discharge Instructions (Addendum)
You may use over the counter ibuprofen or acetaminophen as needed.  For a sore throat, over the counter products such as [employer] Peroxyl Mouth Sore Rinse or Chloraseptic Sore Throat Spray may provide some temporary relief. Your rapid strep test was negative today. We have sent your throat swab for culture and will let you know of any positive results.  You have been tested for COVID-19 today. If your test returns positive, you will receive a phone call from [facility] regarding your results. Negative test results are not called. Both positive and negative results area always visible on MyChart. If you do not have a MyChart account, sign up instructions are provided in your discharge papers. Please do not hesitate to contact us should you have questions or concerns.  

## 2019-12-29 NOTE — ED Triage Notes (Signed)
Pt presents with sore throat X 2 days. 

## 2019-12-29 NOTE — ED Provider Notes (Signed)
[facility]   [id number] 12/29/19 Arrival Time: 1201  ASSESSMENT & PLAN:  1. Sore throat     No signs of peritonsillar abscess. Discussed. COVID testing sent.  Rapid strep negative. Throat culture pending.   Discharge Instructions      You may use over the counter ibuprofen or acetaminophen as needed.   For a sore throat, over the counter products such as [employer] Peroxyl Mouth Sore Rinse or Chloraseptic Sore Throat Spray may provide some temporary relief.  Your rapid strep test was negative today. We have sent your throat swab for culture and will let you know of any positive results.  You have been tested for COVID-19 today. If your test returns positive, you will receive a phone call from [facility] regarding your results. Negative test results are not called. Both positive and negative results area always visible on MyChart. If you do not have a MyChart account, sign up instructions are provided in your discharge papers. Please do not hesitate to contact us should you have questions or concerns.      Reviewed expectations re: course of current medical issues. Questions answered. Outlined signs and symptoms indicating need for more acute intervention. Patient verbalized understanding. After Visit Summary given.   SUBJECTIVE:  Helen Curtis is a 29 y.o. female who reports a sore throat. Gradual onset; 2 days. Pain with swallowing. Symptoms have progressed to a point and plateaued since beginning; without voice changes. No respiratory symptoms. Normal PO intake but reports discomfort with swallowing. No specific alleviating factors. Fever: absent. No neck pain or swelling. No associated nausea, vomiting, or abdominal pain. Known sick contacts: none. Recent travel: none. OTC treatment: none reported.    OBJECTIVE:  Vitals:   12/29/19 1310  BP: (!) 115/58  Pulse: 76  Resp: 17  Temp: 98 F (36.7 C)  TempSrc: Oral  SpO2: 100%     General  appearance: alert; no distress HEENT: throat with moderate erythema; without significant tonsil enlargement; no exudates; uvula is midline Neck: supple with FROM; no lymphadenopathy Lungs: speaks full sentences without difficulty; unlabored Abd: soft; non-tender Skin: reveals no rash; warm and dry Psychological: alert and cooperative; normal mood and affect  No Known Allergies  Past Medical History:  Diagnosis Date  . 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 History of chicken pox   . History of chlamydia   . No pertinent past medical history    Social History   Socioeconomic History  . Marital status: Single    Spouse name: Not on file  . Number of children: Not on file  . Years of education: Not on file  . Highest education level: Not on file  Occupational History  . Not on file  Tobacco Use  . Smoking status: Never Smoker  . Smokeless tobacco: Never Used  Vaping Use  . Vaping Use: Never used  Substance and Sexual Activity  . Alcohol use: Not Currently  . Drug use: No  . Sexual activity: Not Currently    Partners: Male    Birth control/protection: None  Other Topics Concern  . Not on file  Social History Narrative  . Not on file   Social Determinants of Health   Financial Resource Strain: Not on file  Food Insecurity: Not on file  Transportation Needs: Not on file  Physical Activity: Not on file  Stress: Not on file  Social Connections: Not on file  Intimate Partner Violence: Not  on file   Family History  Problem Relation Age of Onset  . Hypertension Mother   . Seizures Mother   . Hypertension Maternal Grandmother   . Diabetes Maternal Grandmother   . Cancer Paternal Grandmother   . Alzheimer's disease Paternal Grandmother   . Cancer Paternal Grandfather   . Diabetes Maternal Grandfather   . Anesthesia problems Neg Hx           Mardella Layman, MD 12/29/19 1540

## 2019-12-30 LAB — SARS CORONAVIRUS 2 (TAT 6-24 HRS): SARS Coronavirus 2: NEGATIVE

## 2019-12-31 ENCOUNTER — Telehealth (HOSPITAL_COMMUNITY): Payer: Self-pay | Admitting: Emergency Medicine

## 2019-12-31 LAB — CULTURE, GROUP A STREP (THRC)

## 2019-12-31 MED ORDER — PENICILLIN V POTASSIUM 500 MG PO TABS: 500.0000 mg | ORAL_TABLET | Freq: Two times a day (BID) | ORAL | 0 refills | Status: AC

## 2020-01-28 ENCOUNTER — Ambulatory Visit: Payer: Self-pay | Admitting: *Deleted

## 2020-01-28 NOTE — Telephone Encounter (Signed)
Patient is calling because she tested positive for pregnancy test 4 days ago. Patient starting bleeding this morning. Patient would like to know is this a period. She should have started her period yesterday. Or could this possibly be a miscarriage. Please advise  Patient called to review symptoms. Patient reports she woke up this am and noted bleeding with wiping. Denies blood filling pad, no abdominal pain, no fever, no lightheadedness or dizziness. Reports feeling more tired than normal. Took at home digital pregnancy and tested positive approx. 4 days ago . Patient has no PCP. Patient denies any gray-white tissue passed. Encouraged patient to do telemed visit for additional resource or go to ED for confirmation of pregnancy and assessment if she has increased bleeding. Emotional support provided. Care advise given. Patient verbalized understanding of care advise and to call back or go to ED if symptoms worsen.   Reason for Disposition . MILD vaginal bleeding (i.e., less than 1 pad / hour; less than patient's usual menstrual bleeding; not just spotting)  Answer Assessment - Initial Assessment Questions 1. ONSET: "When did this bleeding start?"       This am  2. DESCRIPTION: "Describe the bleeding that you are having." "How much bleeding is there?"    - SPOTTING: spotting, or pinkish / brownish mucous discharge; does not fill panti-liner or pad    - MILD:  less than 1 pad / hour; less than patient's usual menstrual bleeding   - MODERATE: 1-2 pads / hour; 1 menstrual cup every 6 hours; small-medium blood clots (e.g., pea, grape, small coin)   - SEVERE: soaking 2 or more pads/hour for 2 or more hours; 1 menstrual cup every 2 hours; bleeding not contained by pads or continuous red blood from vagina; large blood clots (e.g., golf ball, large coin)      mild 3. ABDOMINAL PAIN SEVERITY: If present, ask: "How bad is it?"  (e.g., Scale 1-10; mild, moderate, or severe)   - MILD (1-3): doesn't interfere  with normal activities, abdomen soft and not tender to touch    - MODERATE (4-7): interferes with normal activities or awakens from sleep, tender to touch    - SEVERE (8-10): excruciating pain, doubled over, unable to do any normal activities     none 4. PREGNANCY: "Do you know how many weeks or months pregnant you are?" "When was the first day of your last normal menstrual period?"     No took a pregnancy test and positive 4 days ago . Patient reports this week is the normal time she should start her period.  5. HEMODYNAMIC STATUS: "Are you weak or feeling lightheaded?" If Yes, ask: "Can you stand and walk normally?"      No weakness or lightheadedness.  6. OTHER SYMPTOMS: "What other symptoms are you having with the bleeding?" (e.g., passed tissue, vaginal discharge, fever, menstrual-type cramps)     No  Protocols used: PREGNANCY - VAGINAL BLEEDING LESS THAN [redacted] WEEKS EGA-A-AH

## 2020-05-25 IMAGING — US US MFM OB FOLLOW UP
1 series · 14 of 28 positions shown · non-contrast
Comparison: none

[Series 1: us mfm ob follow up · 50 acquisitions, 14 frames shown]
[im 2/50]
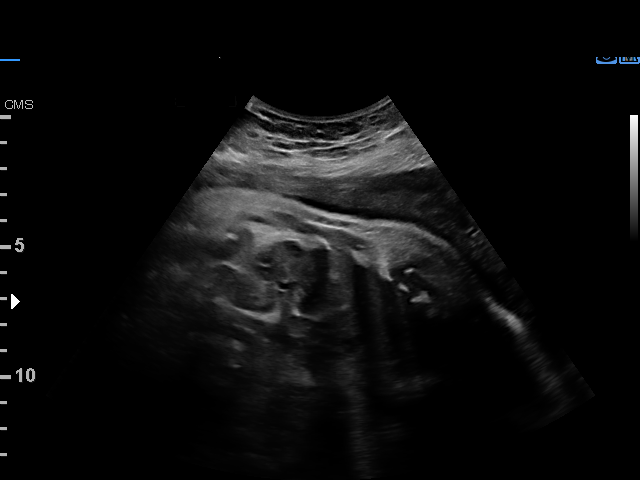
[im 6/50]
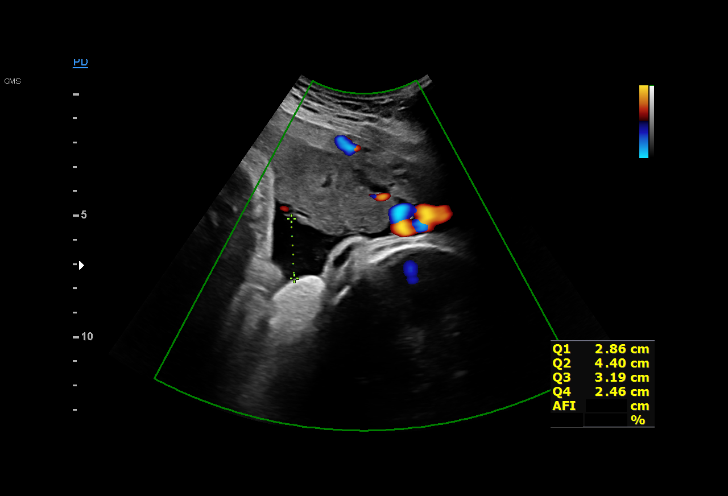
[im 10/50]
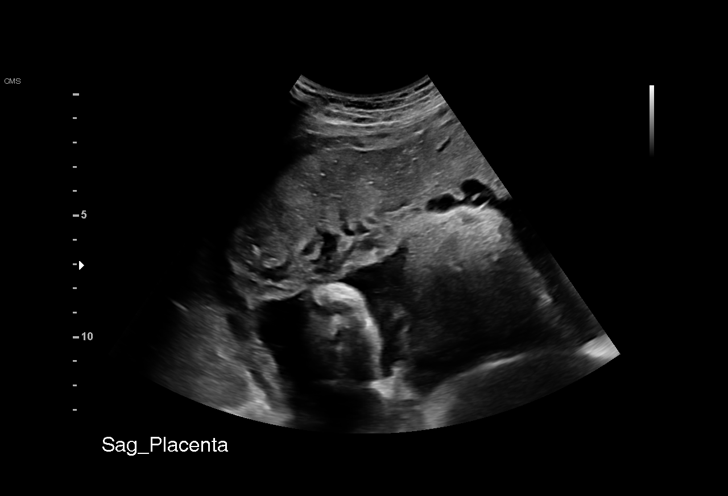
[im 13/50]
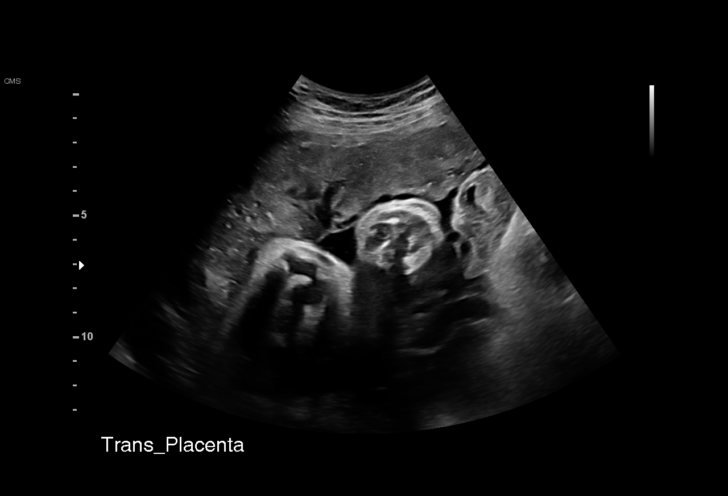
[im 17/50]
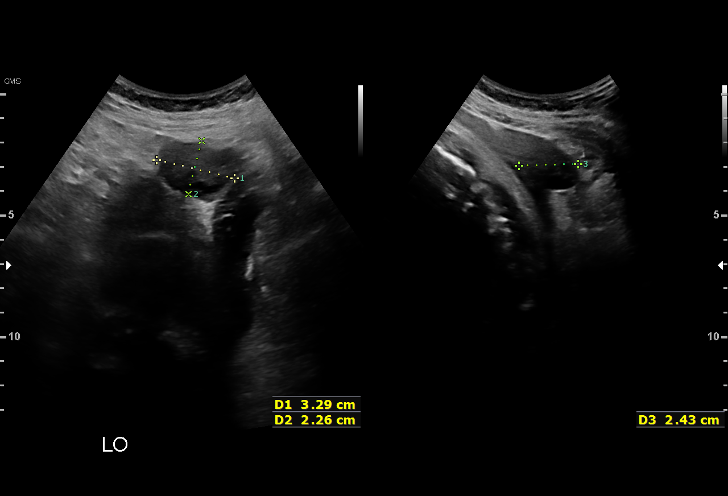
[im 20/50]
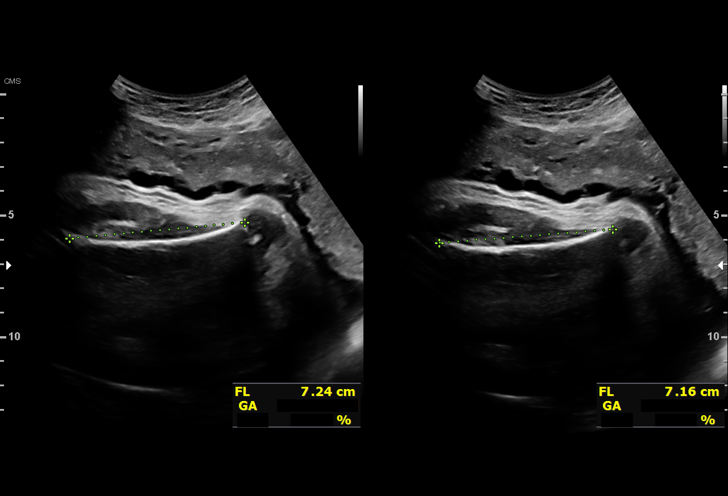
[im 24/50]
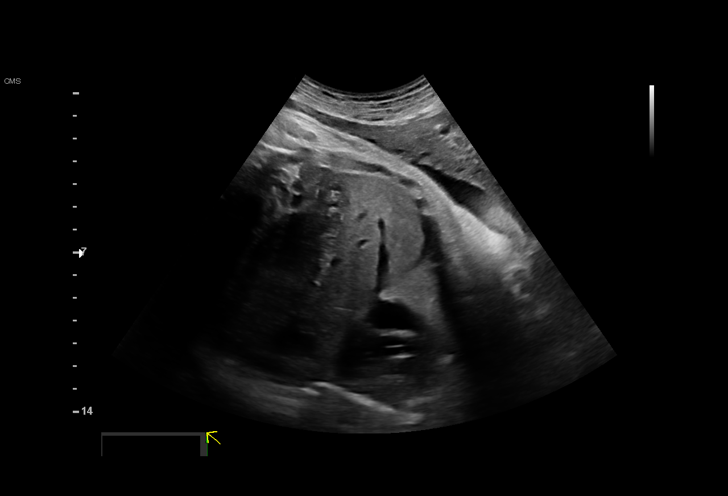
[im 28/50]
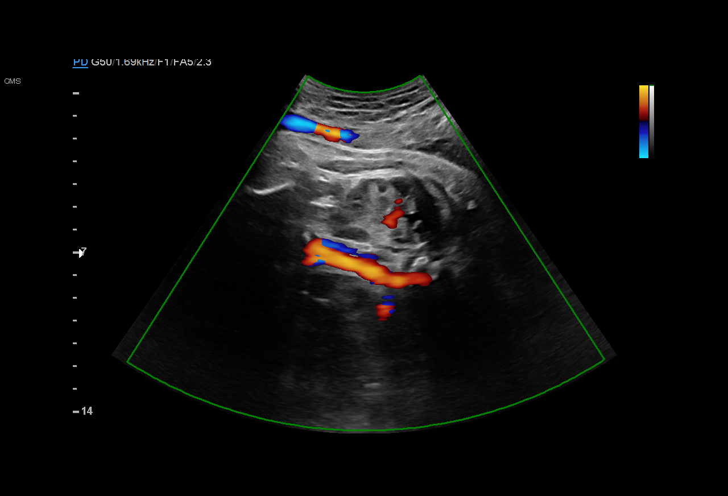
[im 31/50]
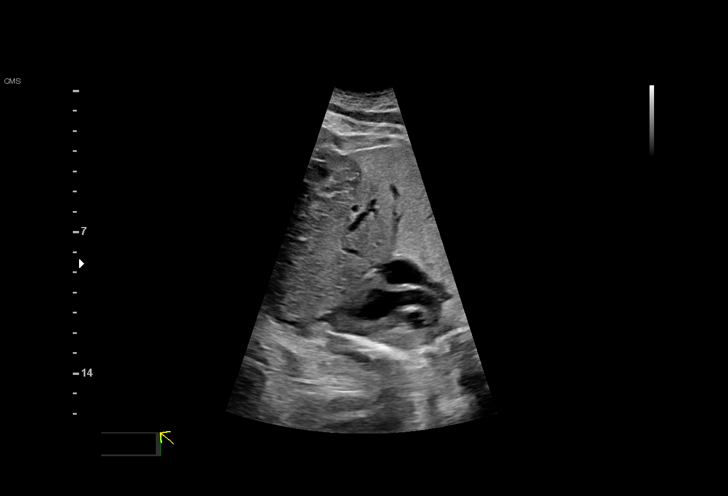
[im 35/50]
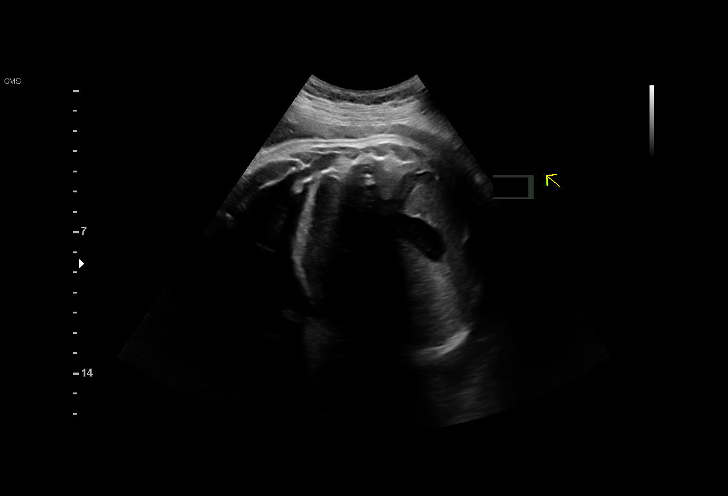
[im 39/50]
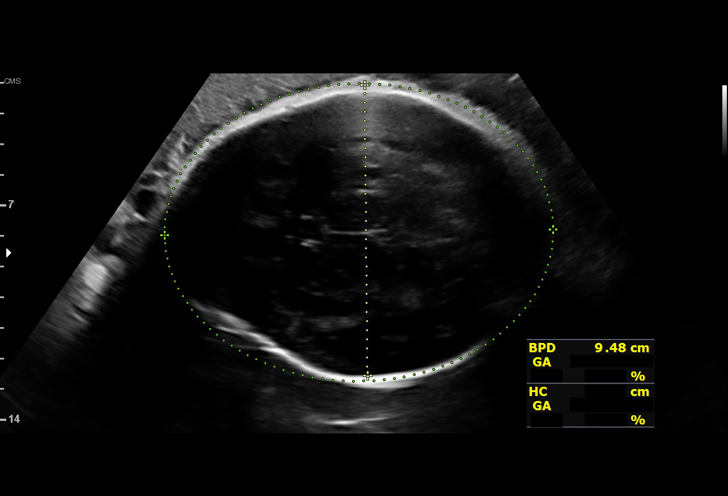
[im 42/50]
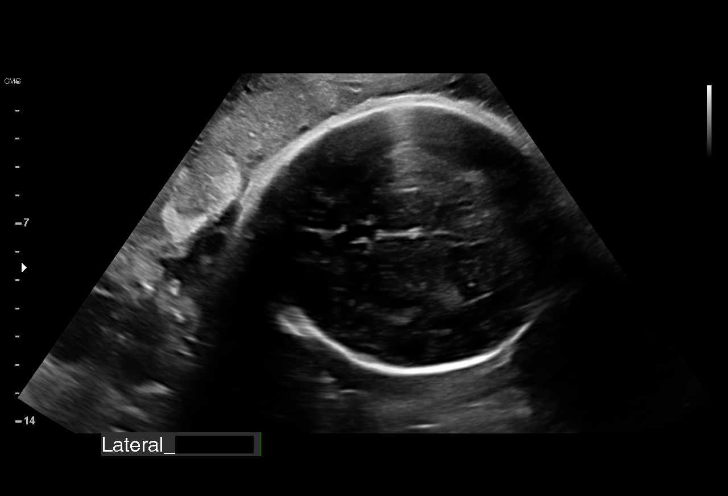
[im 46/50]
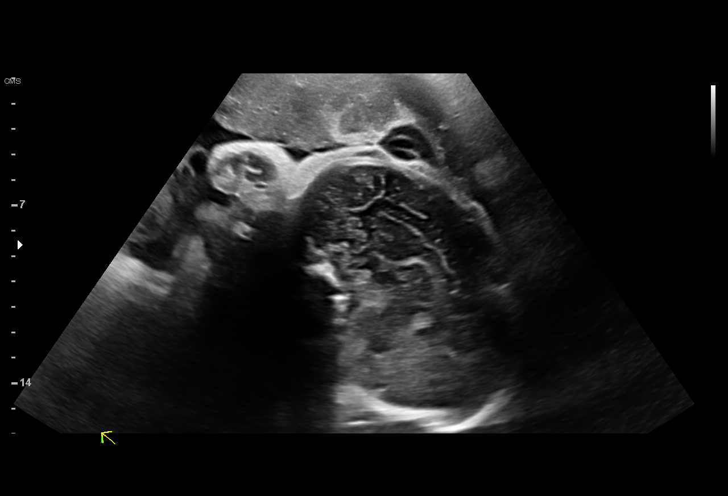
[im 50/50]
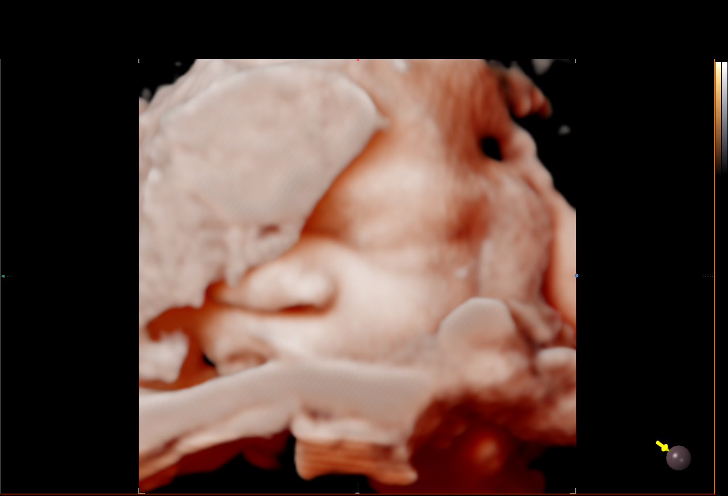

[14 of 28 positions shown; findings below may reference images not displayed]

----------------------------------------------------------------------

 ----------------------------------------------------------------------
Indications

  Encounter for other antenatal screening
  follow-up
  Uterine size-date discrepancy, third trimester
  (S>D)
  37 weeks gestation of pregnancy
 ----------------------------------------------------------------------
Vital Signs

                                                Height:        5'7"
Fetal Evaluation

 Num Of Fetuses:         1
 Fetal Heart Rate(bpm):  141
 Cardiac Activity:       Observed
 Presentation:           Cephalic
 Placenta:               Anterior
 P. Cord Insertion:      Visualized

 Amniotic Fluid
 AFI FV:      Within normal limits

 AFI Sum(cm)     %Tile       Largest Pocket(cm)
 12.91           47

 RUQ(cm)       RLQ(cm)       LUQ(cm)        LLQ(cm)

Biometry

 BPD:      94.3  mm     G. Age:  38w 3d         84  %    CI:        70.89   %    70 - 86
                                                         FL/HC:      21.0   %    20.9 -
 HC:      356.9  mm     G. Age:  41w 6d       > 97  %    HC/AC:      1.02        0.92 -
 AC:      348.6  mm     G. Age:  38w 5d         86  %    FL/BPD:     79.6   %    71 - 87
 FL:       75.1  mm     G. Age:  38w 3d         66  %    FL/AC:      21.5   %    20 - 24
 HUM:      63.7  mm     G. Age:  37w 0d         59  %

 Est. FW:    9575  gm      8 lb 1 oz   > 90  %
OB History

 Gravidity:    2         Term:   1
 Living:       1
Gestational Age

 LMP:           37w 6d        Date:  08/29/17                 EDD:   06/05/18
 U/S Today:     39w 3d                                        EDD:   05/25/18
 Best:          37w 6d     Det. By:  LMP  (08/29/17)          EDD:   06/05/18
Anatomy

 Cranium:               Appears normal         Aortic Arch:            Previously seen
 Cavum:                 Appears normal         Ductal Arch:            Previously seen
 Ventricles:            Appears normal         Diaphragm:              Previously seen
 Choroid Plexus:        Previously seen        Stomach:                Appears normal, left
                                                                       sided
 Cerebellum:            Previously seen        Abdomen:                Appears normal
 Posterior Fossa:       Previously seen        Abdominal Wall:         Previously seen
 Nuchal Fold:           Previously seen        Cord Vessels:           Previously seen
 Face:                  Appears normal         Kidneys:                Appear normal
                        (orbits and profile)
 Lips:                  Appears normal         Bladder:                Appears normal
 Thoracic:              Appears normal         Spine:                  Previously seen
 Heart:                 Previously seen        Upper Extremities:      Previously seen
 RVOT:                  Appears normal         Lower Extremities:      Previously seen
 LVOT:                  Appears normal

 Other:  Heels previously visualized. Technically difficult due to fetal position.
Impression

 Normal interval growth
 Growth appropriate for dates
Recommendations

 Follow up as clinically indicated.

## 2020-09-14 LAB — OB RESULTS CONSOLE GC/CHLAMYDIA
Chlamydia: NEGATIVE
Gonorrhea: NEGATIVE

## 2020-09-14 LAB — OB RESULTS CONSOLE RPR: RPR: NONREACTIVE

## 2020-09-14 LAB — OB RESULTS CONSOLE HEPATITIS B SURFACE ANTIGEN: Hepatitis B Surface Ag: NEGATIVE

## 2020-09-14 LAB — OB RESULTS CONSOLE RUBELLA ANTIBODY, IGM: Rubella: IMMUNE

## 2020-09-14 LAB — OB RESULTS CONSOLE HIV ANTIBODY (ROUTINE TESTING): HIV: NONREACTIVE

## 2020-10-23 ENCOUNTER — Other Ambulatory Visit: Payer: Self-pay

## 2020-10-23 ENCOUNTER — Encounter (HOSPITAL_COMMUNITY): Payer: Self-pay | Admitting: Obstetrics and Gynecology

## 2020-10-23 ENCOUNTER — Inpatient Hospital Stay (HOSPITAL_COMMUNITY)
Admission: AD | Admit: 2020-10-23 | Discharge: 2020-10-23 | Disposition: A | Discharge status: Home / Self Care | Payer: Medicaid Other | Attending: Obstetrics and Gynecology | Admitting: Obstetrics and Gynecology

## 2020-10-23 DIAGNOSIS — R102 Pelvic and perineal pain: Secondary | ICD-10-CM

## 2020-10-23 DIAGNOSIS — N949 Unspecified condition associated with female genital organs and menstrual cycle: Secondary | ICD-10-CM

## 2020-10-23 DIAGNOSIS — Z3A20 20 weeks gestation of pregnancy: Secondary | ICD-10-CM | POA: Insufficient documentation

## 2020-10-23 DIAGNOSIS — O26892 Other specified pregnancy related conditions, second trimester: Secondary | ICD-10-CM | POA: Diagnosis not present

## 2020-10-23 DIAGNOSIS — R109 Unspecified abdominal pain: Secondary | ICD-10-CM | POA: Diagnosis present

## 2020-10-23 LAB — URINALYSIS, ROUTINE W REFLEX MICROSCOPIC
Bilirubin Urine: NEGATIVE
Glucose, UA: NEGATIVE mg/dL
Hgb urine dipstick: NEGATIVE
Ketones, ur: NEGATIVE mg/dL
Leukocytes,Ua: NEGATIVE
Nitrite: NEGATIVE
Protein, ur: NEGATIVE mg/dL
Specific Gravity, Urine: 1.01 (ref 1.005–1.030)
pH: 6 (ref 5.0–8.0)

## 2020-10-23 NOTE — MAU Note (Signed)
Presents with c/o cramping and dizziness.  .  States cramping is in lower back and radiates into her sides and abdomen.  Denies VB.

## 2020-10-23 NOTE — MAU Provider Note (Signed)
History     CSN: [id number]  Arrival date and time: 10/23/20 1347   Event Date/Time   First Provider Initiated Contact with Patient 10/23/20 1505      Chief Complaint  Patient presents with   Cramping   Dizziness   HPI This is a 29yo [id number] at [redacted]w[redacted]d who was sent to the MAU by the office due to abdominal discomfort along the sides of her uterus and some cramping in her back. Started this morning. Does have some feelings like she needs to urinate, but the feeling goes away when she goes to the bathroom. No fevers, chills, nausea, vomiting.  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"   History of chicken pox    History of chlamydia    No pertinent past medical history     Past Surgical History:  Procedure Laterality Date   NO PAST SURGERIES     THERAPEUTIC ABORTION      Family History  Problem Relation Age of Onset   Hypertension Mother    Seizures Mother    Hypertension Maternal Grandmother    Diabetes Maternal Grandmother    Diabetes Maternal Grandfather    Cancer Paternal Grandmother    Alzheimer's disease Paternal Grandmother    Cancer Paternal Grandfather    Anesthesia problems Neg Hx     Social History   Tobacco Use   Smoking status: Never   Smokeless tobacco: Never  Vaping Use   Vaping Use: Never used  Substance Use Topics   Alcohol use: Not Currently   Drug use: No    Allergies: No Known Allergies  Medications Prior to Admission  Medication Sig Dispense Refill Last Dose   Prenatal Vit-Fe Fumarate-FA (MULTIVITAMIN-PRENATAL) 27-0.8 MG TABS tablet Take 1 tablet by mouth daily at 12 noon.   10/23/2020   ondansetron (ZOFRAN ODT) 4 MG disintegrating tablet Take 1 tablet (4 mg total) by mouth every 8 (eight) hours  as needed for nausea or vomiting. 20 tablet 0     Review of Systems Physical Exam   Blood pressure (!) 116/48, pulse 73, temperature 98.6 F (37 C), temperature source Oral, resp. rate 20, height 5\' 7"  (1.702 m), weight 108 kg, last menstrual period 12/26/2019, SpO2 99 %.  Physical Exam Vitals and nursing note reviewed.  Cardiovascular:     Rate and Rhythm: Normal rate and regular rhythm.  Pulmonary:     Effort: Pulmonary effort is normal.     Breath sounds: Normal breath sounds.  Abdominal:     General: Abdomen is flat. There is no distension.     Tenderness: There is no abdominal tenderness. There is no right CVA tenderness or left CVA tenderness.     Comments: Round ligament tenderness  Skin:    General: Skin is warm and dry.     Capillary Refill: Capillary refill takes less than 2 seconds.  Neurological:     General: No focal deficit present.     Mental Status: She is oriented to  person, place, and time.  Psychiatric:        Mood and Affect: Mood normal.        Behavior: Behavior normal.        Thought Content: Thought content normal.        Judgment: Judgment normal.    MAU Course  Procedures  MDM  Assessment and Plan   1. [redacted] weeks gestation of pregnancy   2. Round ligament pain    Discharge to home. Recommend abdominal support band.  Levie Heritage 10/23/2020, 3:16 PM

## 2020-11-28 ENCOUNTER — Encounter (HOSPITAL_COMMUNITY): Payer: Self-pay | Admitting: Obstetrics & Gynecology

## 2020-11-28 ENCOUNTER — Other Ambulatory Visit: Payer: Self-pay

## 2020-11-28 ENCOUNTER — Inpatient Hospital Stay (HOSPITAL_COMMUNITY)
Admission: AD | Admit: 2020-11-28 | Discharge: 2020-11-28 | Disposition: A | Discharge status: Home / Self Care | Payer: Medicaid Other | Attending: Obstetrics & Gynecology | Admitting: Obstetrics & Gynecology

## 2020-11-28 DIAGNOSIS — R531 Weakness: Secondary | ICD-10-CM | POA: Diagnosis not present

## 2020-11-28 DIAGNOSIS — O99012 Anemia complicating pregnancy, second trimester: Secondary | ICD-10-CM

## 2020-11-28 DIAGNOSIS — Z3689 Encounter for other specified antenatal screening: Secondary | ICD-10-CM | POA: Diagnosis not present

## 2020-11-28 DIAGNOSIS — R519 Headache, unspecified: Secondary | ICD-10-CM | POA: Diagnosis not present

## 2020-11-28 DIAGNOSIS — O26892 Other specified pregnancy related conditions, second trimester: Secondary | ICD-10-CM | POA: Diagnosis not present

## 2020-11-28 DIAGNOSIS — Z3A25 25 weeks gestation of pregnancy: Secondary | ICD-10-CM

## 2020-11-28 DIAGNOSIS — R42 Dizziness and giddiness: Secondary | ICD-10-CM | POA: Insufficient documentation

## 2020-11-28 DIAGNOSIS — R0602 Shortness of breath: Secondary | ICD-10-CM | POA: Diagnosis not present

## 2020-11-28 LAB — CBC
HCT: 33.8 % — ABNORMAL LOW (ref 36.0–46.0)
Hemoglobin: 10.9 g/dL — ABNORMAL LOW (ref 12.0–15.0)
MCH: 26.7 pg (ref 26.0–34.0)
MCHC: 32.2 g/dL (ref 30.0–36.0)
MCV: 82.6 fL (ref 80.0–100.0)
Platelets: 212 10*3/uL (ref 150–400)
RBC: 4.09 MIL/uL (ref 3.87–5.11)
RDW: 15.1 % (ref 11.5–15.5)
WBC: 6.6 10*3/uL (ref 4.0–10.5)
nRBC: 0 % (ref 0.0–0.2)

## 2020-11-28 LAB — URINALYSIS, ROUTINE W REFLEX MICROSCOPIC
Bilirubin Urine: NEGATIVE
Glucose, UA: NEGATIVE mg/dL
Hgb urine dipstick: NEGATIVE
Ketones, ur: NEGATIVE mg/dL
Leukocytes,Ua: NEGATIVE
Nitrite: NEGATIVE
Protein, ur: NEGATIVE mg/dL
Specific Gravity, Urine: 1.013 (ref 1.005–1.030)
pH: 7 (ref 5.0–8.0)
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
## 2020-11-28 NOTE — MAU Note (Signed)
Helen Curtis is a 30 y.o. at [redacted]w[redacted]d here in MAU reporting: for the past 2 weeks has been feeling dizzy and weak. Had a cough back in October and since then has felt SOB. SOB is intermittent and mostly occurs with movement. DFM. At night while she is sleeping notices that her left arm is stiff. No pain at this time.  Onset of complaint: ongoing  Pain score: 0/10  Vitals:   11/28/20 1338  BP: (!) 116/59  Pulse: 90  Resp: 16  Temp: 98.4 F (36.9 C)  SpO2: 100%     FHT: EFM applied in room  Lab orders placed from triage: UA

## 2021-01-14 NOTE — L&D Delivery Note (Addendum)
Delivery Note Labor onset: 03/05/2021  Labor Onset Time: 0440 Complete dilation at 7:38 AM  Onset of pushing at 0738 FHR second stage Cat 1&2 Analgesia/Anesthesia intrapartum: epidural  Guided pushing with maternal urge. Delivery of a viable female at 24. Fetal head delivered in LOP position.  Nuchal cord: x 1, reduced.  Infant placed on maternal abd, dried, and tactile stim. Spontaneous cry. Cord double clamped after 1 min and cut by family member.  3 Rns present for birth.  Cord blood sample collected: yes Arterial cord blood sample collected: N/A AMTSL Placenta delivered Helen Curtis, cord avulsion,  appeared fragmented but intact, with 3 VC.  Placenta to pathology. Uterine tone firm, bleeding minimal  1st laceration identified.  Anesthesia: epidural Repair: 3-0 vicryl QBL/EBL (mL): 200 Complications: cord avulsion APGAR: APGAR (1 MIN): 8   APGAR (5 MINS): 9   APGAR (10 MINS):   Mom to postpartum.  Baby to Couplet care / Skin to Skin. Dr Sallye Ober updated on status and POC  Helen Leitz MSN, CNM 03/05/2021, 8:52 AM

## 2021-01-16 ENCOUNTER — Inpatient Hospital Stay (HOSPITAL_COMMUNITY)
Admission: AD | Admit: 2021-01-16 | Discharge: 2021-01-16 | Disposition: A | Discharge status: Home / Self Care | Payer: Medicaid Other | Attending: Obstetrics & Gynecology | Admitting: Obstetrics & Gynecology

## 2021-01-16 ENCOUNTER — Encounter (HOSPITAL_COMMUNITY): Payer: Self-pay | Admitting: Obstetrics & Gynecology

## 2021-01-16 ENCOUNTER — Other Ambulatory Visit: Payer: Self-pay

## 2021-01-16 DIAGNOSIS — G589 Mononeuropathy, unspecified: Secondary | ICD-10-CM | POA: Insufficient documentation

## 2021-01-16 DIAGNOSIS — R1031 Right lower quadrant pain: Secondary | ICD-10-CM | POA: Insufficient documentation

## 2021-01-16 DIAGNOSIS — R102 Pelvic and perineal pain: Secondary | ICD-10-CM | POA: Diagnosis not present

## 2021-01-16 DIAGNOSIS — M545 Low back pain, unspecified: Secondary | ICD-10-CM | POA: Insufficient documentation

## 2021-01-16 DIAGNOSIS — O99353 Diseases of the nervous system complicating pregnancy, third trimester: Secondary | ICD-10-CM | POA: Insufficient documentation

## 2021-01-16 DIAGNOSIS — O26893 Other specified pregnancy related conditions, third trimester: Secondary | ICD-10-CM | POA: Insufficient documentation

## 2021-01-16 DIAGNOSIS — Z3A32 32 weeks gestation of pregnancy: Secondary | ICD-10-CM | POA: Insufficient documentation

## 2021-01-16 DIAGNOSIS — R1032 Left lower quadrant pain: Secondary | ICD-10-CM | POA: Diagnosis not present

## 2021-01-16 LAB — URINALYSIS, MICROSCOPIC (REFLEX): RBC / HPF: NONE SEEN RBC/hpf (ref 0–5)

## 2021-01-16 LAB — URINALYSIS, ROUTINE W REFLEX MICROSCOPIC
Bilirubin Urine: NEGATIVE
Glucose, UA: NEGATIVE mg/dL
Hgb urine dipstick: NEGATIVE
Ketones, ur: NEGATIVE mg/dL
Nitrite: NEGATIVE
Protein, ur: NEGATIVE mg/dL
Specific Gravity, Urine: 1.01 (ref 1.005–1.030)
pH: 6.5 (ref 5.0–8.0)

## 2021-01-16 LAB — AMNISURE RUPTURE OF MEMBRANE (ROM) NOT AT ARMC: Amnisure ROM: NEGATIVE

## 2021-01-16 MED ORDER — CYCLOBENZAPRINE HCL 10 MG PO TABS: 10.0000 mg | ORAL_TABLET | Freq: Two times a day (BID) | ORAL | 0 refills | Status: DC | PRN

## 2021-01-16 MED ORDER — ACETAMINOPHEN 325 MG PO TABS: 650.0000 mg | ORAL_TABLET | ORAL | 0 refills | Status: AC | PRN

## 2021-01-16 MED ORDER — CYCLOBENZAPRINE HCL 5 MG PO TABS
10.0000 mg | ORAL_TABLET | Freq: Once | ORAL | Status: AC
  Administered 2021-01-16: 10 mg via ORAL
  Filled 2021-01-16: qty 2

## 2021-01-16 NOTE — MAU Provider Note (Signed)
History     CSN: [id number]  Arrival date and time: 01/16/21 1746   Event Date/Time   First Provider Initiated Contact with Patient 01/16/21 1841      Chief Complaint  Patient presents with   Abdominal Pain   Back Pain   Pelvic Pain   Rupture of Membranes   HPI Helen Curtis is a 31 y.o. [id number] at [redacted]w[redacted]d who presents to MAU with chief complaints of pelvic pain, bilateral lower abdominal pain and low back pain. These are recurrent problems, worsening over time. Patient endorses having a "pinched nerve" in her back. Her back and pelvic pain are both scored as 7/10.   Patient also reports feeling that her underwear is "wet". This is a new problem, onset this morning. She denies recurrent leaking of fluid during the day. She is remote from sexual intercourse.  She denies contractions, vaginal bleeding, decreased fetal movement, fever, falls, or recent illness.   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"   History of chicken pox    History of chlamydia    No pertinent past medical history     Past Surgical History:  Procedure Laterality Date   NO PAST SURGERIES     THERAPEUTIC ABORTION      Family History  Problem Relation Age of Onset   Hypertension Mother    Seizures Mother    Hypertension Maternal Grandmother    Diabetes Maternal Grandmother    Diabetes Maternal Grandfather    Cancer Paternal Grandmother    Alzheimer's disease Paternal Grandmother    Cancer Paternal Grandfather    Anesthesia problems Neg Hx     Social History   Tobacco Use   Smoking status: Never   Smokeless tobacco: Never  Vaping Use   Vaping Use: Never used  Substance Use Topics   Alcohol use: Not  Currently   Drug use: No    Allergies: No Known Allergies  Medications Prior to Admission  Medication Sig Dispense Refill Last Dose   Prenatal Vit-Fe Fumarate-FA (MULTIVITAMIN-PRENATAL) 27-0.8 MG TABS tablet Take 1 tablet by mouth daily at 12 noon.   01/16/2021    Review of Systems  Gastrointestinal:  Positive for abdominal pain.  Genitourinary:  Positive for pelvic pain.  Musculoskeletal:  Positive for back pain.  All other systems reviewed and are negative. Physical Exam   Blood pressure 121/64, pulse 87, temperature 98.8 F (37.1 C), temperature source Oral, resp. rate 18, height 5\' 7"  (1.702 m), weight 113.2 kg, last menstrual period 12/26/2019, SpO2 100 %.  Physical Exam Vitals and nursing note reviewed. Exam conducted with a chaperone present.  Constitutional:      Appearance: She is well-developed.  Cardiovascular:     Rate and Rhythm: Normal rate and regular rhythm.     Heart sounds: Normal heart sounds.  Pulmonary:     Effort: Pulmonary effort is normal.     Breath sounds: Normal breath sounds.  Abdominal:  Tenderness: There is no abdominal tenderness.     Comments: Gravid  Skin:    Capillary Refill: Capillary refill takes less than 2 seconds.  Neurological:     Mental Status: She is alert and oriented to person, place, and time.  Psychiatric:        Mood and Affect: Mood normal.        Behavior: Behavior normal.    MAU Course/MDM  Procedures: sterile speculum exam  --Monitoring discontinued due to reassuring tracing, negative fern, negative pooling, patient report of worsening back spasm in the setting of "pinched nerve"  --Intact amniotic sac: negative pooling, negative fern, negative Amnisure  --Reactive tracing: baseline 145, mod var, + 15 x 15 accels, no decels  --Toco: occasional UI  BP 126/64    Pulse 90    Temp 98.8 F (37.1 C) (Oral)    Resp 18    Ht 5\' 7"  (1.702 m)    Wt 113.2 kg    LMP 12/26/2019    SpO2 100%    BMI 39.09 kg/m    Component     Latest Ref Rng & Units 01/16/2021  RBC / HPF     0 - 5 RBC/hpf NONE SEEN  WBC, UA     0 - 5 WBC/hpf 0-5  Bacteria, UA     NONE SEEN FEW (A)  Squamous Epithelial / LPF     0 - 5 0-5  Mucus      PRESENT   Meds ordered this encounter  Medications   cyclobenzaprine (FLEXERIL) tablet 10 mg   acetaminophen (TYLENOL) 325 MG tablet    Sig: Take 2 tablets (650 mg total) by mouth every 4 (four) hours as needed.    Dispense:  180 tablet    Refill:  0    Order Specific Question:   Supervising Provider    Answer:   CONSTANT, PEGGY [[id number]]   cyclobenzaprine (FLEXERIL) 10 MG tablet    Sig: Take 1 tablet (10 mg total) by mouth 2 (two) times daily as needed for muscle spasms.    Dispense:  20 tablet    Refill:  0    Order Specific Question:   Supervising Provider    Answer:   CONSTANT, PEGGY [[id number]]   Assessment and Plan  --31 y.o. 38 at [redacted]w[redacted]d  --Reactive tracing --Closed cervix --Intact membranes --Pelvic pain in third trimester --Amb referral to Physical Therapy --Discharge home in stable condition  [redacted]w[redacted]d, MSA, MSN, CNM Certified Nurse Midwife, [employer], clinical, [facility]

## 2021-01-16 NOTE — MAU Note (Addendum)
Having a lot of pain and pressure.  Esp when walking, getting worse. Is cramping, started in the low back and comes around to the front..  Having a lot of bowel movements, getting looser. Feeling wet. (office called this morning about her coming)

## 2021-01-17 ENCOUNTER — Other Ambulatory Visit: Payer: Self-pay

## 2021-01-17 DIAGNOSIS — R102 Pelvic and perineal pain: Secondary | ICD-10-CM

## 2021-01-17 DIAGNOSIS — O26893 Other specified pregnancy related conditions, third trimester: Secondary | ICD-10-CM

## 2021-02-14 LAB — OB RESULTS CONSOLE GBS: GBS: POSITIVE

## 2021-02-18 ENCOUNTER — Other Ambulatory Visit: Payer: Self-pay

## 2021-02-18 ENCOUNTER — Inpatient Hospital Stay (HOSPITAL_COMMUNITY)
Admission: AD | Admit: 2021-02-18 | Discharge: 2021-02-18 | Disposition: A | Discharge status: Home / Self Care | Payer: Medicaid Other | Attending: Obstetrics and Gynecology | Admitting: Obstetrics and Gynecology

## 2021-02-18 DIAGNOSIS — O479 False labor, unspecified: Secondary | ICD-10-CM | POA: Diagnosis not present

## 2021-02-18 DIAGNOSIS — Z3A37 37 weeks gestation of pregnancy: Secondary | ICD-10-CM | POA: Diagnosis not present

## 2021-02-18 DIAGNOSIS — Z3689 Encounter for other specified antenatal screening: Secondary | ICD-10-CM

## 2021-02-18 NOTE — MAU Note (Signed)
Pt presents to MAU with complaints of ctx. Pt states that she is unsure how often she is having ctx but has been experiencing ctx since this morning and has worsened since 1700.  Denies LOF and VB. Reports +FM.

## 2021-02-18 NOTE — MAU Provider Note (Signed)
Per RN: Helen Curtis is a 31 y.o. [id number] at [redacted]w[redacted]d  who presents to MAU today complaining of contractions. No VB or LOF. +FM.   O: BP (!) 125/59    Pulse 93    Temp 98.8 F (37.1 C) (Oral)    Resp 18    Ht 5\' 7"  (1.702 m)    Wt 117.5 kg    LMP 12/26/2019    SpO2 99%    BMI 40.57 kg/m   Cervical exam:  Dilation: Fingertip Effacement (%): 20 Cervical Position: Posterior Station: Ballotable Exam by:: [email or web address] RN  Fetal Monitoring: Baseline: 145 Variability: mod Accelerations: + Decelerations: no Contractions: rare  MDM: no sings of labor.  A: SIUP at [redacted]w[redacted]d  False labor Reactive NST  P: Discharge home Follow up @[facility]  as scheduled Labor precautions  [redacted]w[redacted]d, CNM 02/18/2021 10:15 PM

## 2021-03-02 ENCOUNTER — Other Ambulatory Visit: Payer: Self-pay | Admitting: Obstetrics and Gynecology

## 2021-03-03 ENCOUNTER — Encounter (HOSPITAL_COMMUNITY): Payer: Self-pay | Admitting: Obstetrics and Gynecology

## 2021-03-03 ENCOUNTER — Other Ambulatory Visit: Payer: Self-pay

## 2021-03-03 ENCOUNTER — Inpatient Hospital Stay (HOSPITAL_COMMUNITY): Payer: Medicaid Other

## 2021-03-03 ENCOUNTER — Inpatient Hospital Stay (HOSPITAL_COMMUNITY)
Admission: AD | Admit: 2021-03-03 | Discharge: 2021-03-07 | DRG: 807 | Disposition: A | Discharge status: Home / Self Care | Payer: Medicaid Other | Attending: Obstetrics and Gynecology | Admitting: Obstetrics and Gynecology

## 2021-03-03 DIAGNOSIS — Z3490 Encounter for supervision of normal pregnancy, unspecified, unspecified trimester: Principal | ICD-10-CM | POA: Diagnosis present

## 2021-03-03 DIAGNOSIS — O139 Gestational [pregnancy-induced] hypertension without significant proteinuria, unspecified trimester: Secondary | ICD-10-CM | POA: Diagnosis not present

## 2021-03-03 DIAGNOSIS — Z20822 Contact with and (suspected) exposure to covid-19: Secondary | ICD-10-CM | POA: Diagnosis present

## 2021-03-03 DIAGNOSIS — Z3A39 39 weeks gestation of pregnancy: Secondary | ICD-10-CM

## 2021-03-03 DIAGNOSIS — O26893 Other specified pregnancy related conditions, third trimester: Secondary | ICD-10-CM | POA: Diagnosis present

## 2021-03-03 DIAGNOSIS — O99214 Obesity complicating childbirth: Secondary | ICD-10-CM | POA: Diagnosis present

## 2021-03-03 DIAGNOSIS — O99824 Streptococcus B carrier state complicating childbirth: Secondary | ICD-10-CM | POA: Diagnosis present

## 2021-03-03 DIAGNOSIS — O134 Gestational [pregnancy-induced] hypertension without significant proteinuria, complicating childbirth: Principal | ICD-10-CM | POA: Diagnosis present

## 2021-03-03 DIAGNOSIS — O3663X0 Maternal care for excessive fetal growth, third trimester, not applicable or unspecified: Secondary | ICD-10-CM | POA: Diagnosis present

## 2021-03-03 LAB — COMPREHENSIVE METABOLIC PANEL
ALT: 14 U/L (ref 0–44)
AST: 26 U/L (ref 15–41)
Albumin: 2.8 g/dL — ABNORMAL LOW (ref 3.5–5.0)
Alkaline Phosphatase: 189 U/L — ABNORMAL HIGH (ref 38–126)
Anion gap: 9 (ref 5–15)
BUN: 5 mg/dL — ABNORMAL LOW (ref 6–20)
CO2: 20 mmol/L — ABNORMAL LOW (ref 22–32)
Calcium: 8.8 mg/dL — ABNORMAL LOW (ref 8.9–10.3)
Chloride: 107 mmol/L (ref 98–111)
Creatinine, Ser: 0.53 mg/dL (ref 0.44–1.00)
GFR, Estimated: >60 mL/min (ref >60)
Glucose, Bld: 99 mg/dL (ref 70–99)
Potassium: 3.6 mmol/L (ref 3.5–5.1)
Sodium: 136 mmol/L (ref 135–145)
Total Bilirubin: 0.5 mg/dL (ref 0.3–1.2)
Total Protein: 6.3 g/dL — ABNORMAL LOW (ref 6.5–8.1)

## 2021-03-03 LAB — PROTEIN / CREATININE RATIO, URINE
Creatinine, Urine: 36.14 mg/dL
Total Protein, Urine: <6 mg/dL

## 2021-03-03 LAB — CBC
HCT: 33.4 % — ABNORMAL LOW (ref 36.0–46.0)
Hemoglobin: 11 g/dL — ABNORMAL LOW (ref 12.0–15.0)
MCH: 26.2 pg (ref 26.0–34.0)
MCHC: 32.9 g/dL (ref 30.0–36.0)
MCV: 79.5 fL — ABNORMAL LOW (ref 80.0–100.0)
Platelets: 195 10*3/uL (ref 150–400)
RBC: 4.2 MIL/uL (ref 3.87–5.11)
RDW: 16.1 % — ABNORMAL HIGH (ref 11.5–15.5)
WBC: 5.2 10*3/uL (ref 4.0–10.5)
nRBC: 0 % (ref 0.0–0.2)

## 2021-03-03 LAB — RESP PANEL BY RT-PCR (FLU A&B, COVID) ARPGX2
Influenza A by PCR: NEGATIVE
Influenza B by PCR: NEGATIVE
SARS Coronavirus 2 by RT PCR: NEGATIVE

## 2021-03-03 LAB — TYPE AND SCREEN
ABO/RH(D): B POS
Antibody Screen: NEGATIVE

## 2021-03-03 MED ORDER — TERBUTALINE SULFATE 1 MG/ML IJ SOLN: 0.2500 mg | Freq: Once | INTRAMUSCULAR | Status: DC | PRN

## 2021-03-03 MED ORDER — OXYCODONE-ACETAMINOPHEN 5-325 MG PO TABS: 2.0000 | ORAL_TABLET | ORAL | Status: DC | PRN

## 2021-03-03 MED ORDER — OXYCODONE-ACETAMINOPHEN 5-325 MG PO TABS: 1.0000 | ORAL_TABLET | ORAL | Status: DC | PRN

## 2021-03-03 MED ORDER — LACTATED RINGERS IV SOLN: INTRAVENOUS | Status: DC

## 2021-03-03 MED ORDER — FENTANYL CITRATE (PF) 100 MCG/2ML IJ SOLN
50.0000 ug | INTRAMUSCULAR | Status: DC | PRN
  Administered 2021-03-05: 50 ug via INTRAVENOUS
  Filled 2021-03-03: qty 2

## 2021-03-03 MED ORDER — MISOPROSTOL 25 MCG QUARTER TABLET
25.0000 ug | ORAL_TABLET | ORAL | Status: DC
  Administered 2021-03-04 (×2): 25 ug via VAGINAL
  Filled 2021-03-03 (×2): qty 1

## 2021-03-03 MED ORDER — PENICILLIN G POT IN DEXTROSE 60000 UNIT/ML IV SOLN
3.0000 10*6.[IU] | INTRAVENOUS | Status: DC
  Administered 2021-03-03 – 2021-03-04 (×4): 3 10*6.[IU] via INTRAVENOUS
  Filled 2021-03-03 (×4): qty 50

## 2021-03-03 MED ORDER — ONDANSETRON HCL 4 MG/2ML IJ SOLN: 4.0000 mg | Freq: Four times a day (QID) | INTRAMUSCULAR | Status: DC | PRN

## 2021-03-03 MED ORDER — SODIUM CHLORIDE 0.9 % IV SOLN
5.0000 10*6.[IU] | Freq: Once | INTRAVENOUS | Status: AC
  Administered 2021-03-03: 5 10*6.[IU] via INTRAVENOUS
  Filled 2021-03-03: qty 5

## 2021-03-03 MED ORDER — MISOPROSTOL 50MCG HALF TABLET
50.0000 ug | ORAL_TABLET | ORAL | Status: DC
  Administered 2021-03-03: 50 ug via BUCCAL

## 2021-03-03 MED ORDER — SOD CITRATE-CITRIC ACID 500-334 MG/5ML PO SOLN: 30.0000 mL | ORAL | Status: DC | PRN

## 2021-03-03 MED ORDER — ACETAMINOPHEN 325 MG PO TABS: 650.0000 mg | ORAL_TABLET | ORAL | Status: DC | PRN

## 2021-03-03 MED ORDER — LIDOCAINE HCL (PF) 1 % IJ SOLN: 30.0000 mL | INTRAMUSCULAR | Status: DC | PRN

## 2021-03-03 MED ORDER — MISOPROSTOL 25 MCG QUARTER TABLET
25.0000 ug | ORAL_TABLET | ORAL | Status: DC | PRN
  Filled 2021-03-03: qty 1

## 2021-03-03 MED ORDER — HYDROXYZINE HCL 50 MG PO TABS
50.0000 mg | ORAL_TABLET | Freq: Four times a day (QID) | ORAL | Status: DC | PRN
  Administered 2021-03-03 – 2021-03-04 (×2): 50 mg via ORAL
  Filled 2021-03-03 (×2): qty 1

## 2021-03-03 MED ORDER — OXYTOCIN-SODIUM CHLORIDE 30-0.9 UT/500ML-% IV SOLN: 2.5000 [IU]/h | INTRAVENOUS | Status: DC

## 2021-03-03 MED ORDER — OXYTOCIN BOLUS FROM INFUSION
333.0000 mL | Freq: Once | INTRAVENOUS | Status: AC
  Administered 2021-03-05: 333 mL via INTRAVENOUS

## 2021-03-03 MED ORDER — LACTATED RINGERS IV SOLN: 500.0000 mL | INTRAVENOUS | Status: DC | PRN

## 2021-03-03 NOTE — H&P (Signed)
Helen Curtis is a 31 y.o. female, [id number], IUP at 39.3 weeks, presenting for Elective IOL. H/O LGA (2nd baby 9+2, required VE assist.  This pregnancy, LGA at anatomy and on f/u, recheck 3rd trimester 02/14/2021 84%, 7.8lbs, vertex, anterior placenta, AFI 6.9, BPP 8/8.). Vit D def 18.2at NOB. 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  Recommend fetal echo, never done.) Late entry into PNC at 15 weeks. GBS+.  Pt endorse + Fm. Denies vaginal leakage. Denies vaginal bleeding. Denies feeling cxt's.   Patient Active Problem List   Diagnosis Date Noted   Post-dates pregnancy 06/14/2018   GBS (group B Streptococcus carrier), +RV culture, currently pregnant 06/03/2018   Back pain affecting pregnancy in third trimester 03/18/2018   Supervision of normal pregnancy 11/25/2017   Obesity 01/12/2011     Active Ambulatory Problems    Diagnosis Date Noted   Obesity 01/12/2011   Supervision of normal pregnancy 11/25/2017   Back pain affecting pregnancy in third trimester 03/18/2018   GBS (group B Streptococcus carrier), +RV culture, currently pregnant 06/03/2018   Post-dates pregnancy 06/14/2018   Resolved Ambulatory Problems    Diagnosis Date Noted   Normal pregnancy 01/11/2011   GBS carrier 01/12/2011   Normal vaginal delivery 01/12/2011   Past Medical History:  Diagnosis Date   Heart murmur    History of chicken pox    History of chlamydia    No pertinent past medical history       No medications prior to admission.    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   Current Outpatient Medications on File Prior to Encounter  Medication Sig Dispense Refill   cyclobenzaprine (FLEXERIL) 10 MG tablet Take 1 tablet (10 mg total) by mouth  2 (two) times daily as needed for muscle spasms. 20 tablet 0   Prenatal Vit-Fe Fumarate-FA (MULTIVITAMIN-PRENATAL) 27-0.8 MG TABS tablet Take 1 tablet by mouth daily at 12 noon.     [DISCONTINUED] Blood Pressure Monitor KIT Apply 1 each topically 1 day or 1 dose for 1 dose. (Patient not taking: Reported on 06/10/2019) 1 each 0     No Known Allergies  History of present pregnancy: Pt Info/Preference:  Screening/Consents:  Labs:   EDD: Estimated Date of Delivery: 03/07/21  Establised: Patient's last menstrual period was 12/26/2019.  Anatomy Scan: Date: 11/09/2020 Placenta Location: anterior Genetic Screen: [place]:LR AFP: Neg First Tri: Quad:  Office: [facility]            First PNV: 15.1 weeks Blood Type  B+  Language: english Last PNV: 39.2 weeks Rhogam  N/A  Flu Vaccine:  UTD   Antibody  Neg  TDaP vaccine UTD   GTT: Early: 5.3 Third Trimester: 145  Feeding Plan: breast BTL: no Rubella:  Immune  Contraception: ??? VBAC: no RPR:   NR  Circumcision: ???   HBsAg:  Neg/HCV neg  Pediatrician:  ???   HIV:   Neg  Prenatal Classes: no Additional [place]: See HPI GBS:  Positive (For PCN allergy, check sensitivities)       Chlamydia: neg    MFM Referral/Consult:  GC: neg  Support Person: Friend    PAP: Abnormal ???  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 Social History:  reports that she has never smoked. She has never used smokeless tobacco. She reports that she does not currently use alcohol. She reports that she does not use drugs.   Prenatal Transfer Tool  Maternal Diabetes: No Genetic Screening: Normal Maternal Ultrasounds/Referrals: Normal Fetal Ultrasounds or other Referrals:  Fetal echo, Other: recommended but never done per pt.  Maternal Substance Abuse:  No Significant Maternal Medications:  None Significant Maternal Lab Results: Group B Strep positive  ROS:  Review of Systems  Constitutional: Negative.   HENT: Negative.    Eyes: Negative.   Respiratory: Negative.    Cardiovascular: Negative.   Gastrointestinal: Negative.   Genitourinary: Negative.   Musculoskeletal: Negative.   Skin: Negative.   Neurological: Negative.   Endo/Heme/Allergies: Negative.   Psychiatric/Behavioral: Negative.      Physical Exam: LMP 12/26/2019   Physical Exam Vitals and nursing note reviewed.  Constitutional:      Appearance: Normal appearance.  HENT:     Head: Normocephalic and atraumatic.     Nose: Nose normal.     Mouth/Throat:     Mouth: Mucous membranes are moist.  Eyes:     Extraocular Movements: Extraocular movements intact.     Pupils: Pupils are equal, round, and reactive to light.  Cardiovascular:     Rate and Rhythm: Normal rate and regular rhythm.     Pulses: Normal pulses.     Heart sounds: Normal heart sounds.  Pulmonary:     Effort: Pulmonary effort is normal.     Breath sounds: Normal breath sounds.  Abdominal:     General: Bowel sounds are normal.  Genitourinary:    Comments: Uterus gravida, pelvis adequate. .  Musculoskeletal:        General: Normal range of motion.     Cervical back: Normal range of motion and neck  supple.  Skin:    General: Skin is warm.     Capillary Refill: Capillary refill takes less than 2 seconds.  Neurological:     General: No focal deficit present.     Mental Status: She is alert.  Psychiatric:        Mood and Affect: Mood normal.     NST: FHR baseline 150 bpm, Variability: moderate, Accelerations:present, Decelerations:  Absent= Cat 1/Reactive UC:   none SVE:  0/40/-3  ,  vertex verified by fetal sutures.  Leopold's: Position vertex, EFW 8lbs via leopold's.   Labs: No results found for this or any previous visit (from the past 24 hour(s)).  Imaging:  No results found.  MAU Course: No orders of the defined types were placed in this encounter.  No orders of the defined types were placed in this encounter.   Assessment/Plan: Helen Curtis is a 31 y.o. female, [id number], IUP at 39.3 weeks, presenting for Elective IOL. H/O LGA (2nd baby 9+2, required VE assist.  This pregnancy, LGA at anatomy and on f/u, recheck 3rd trimester 02/14/2021 84%, 7.8lbs, vertex, anterior placenta, AFI 6.9, BPP 8/8.). Vit D def 18.2at NOB. 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  Recommend fetal echo, never done.) Late entry into PNC at 15 weeks. GBS+.  Pt endorse + Fm. Denies vaginal leakage. Denies vaginal bleeding. Denies feeling cxt's.   FWB: Cat 1 Fetal Tracing.   Plan: Admit to [place] per consult with Dr Mancel Bale Routine [facility] orders Pain med/epidural prn PCN G for GBS prophylaxis  49mg Cytotec for cervical ripening Q4H.  Elevated BP at [facility] x1 1490/80, cbc,  Anticipate labor progression   JNoralyn PickNP-C, CNM, MSN 03/03/2021, 11:25 AM

## 2021-03-03 NOTE — Progress Notes (Signed)
Labor Progress Note  Laurynn Vassallo is a 31 y.o. female, [id number], IUP at 39.3 weeks, presenting for Elective IOL. H/O LGA (2nd baby 9+2, required VE assist.  This pregnancy, LGA at anatomy and on f/u, recheck 3rd trimester 02/14/2021 84%, 7.8lbs, vertex, anterior placenta, AFI 6.9, BPP 8/8.). Vit D def 18.2at NOB. 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  Recommend fetal echo, never done.) Late entry into PNC at 15 weeks. GBS+.  Pt endorse + Fm  Subjective: Pt stable, in bed resting well, feeling cxt and tolerates well.  Patient Active Problem List   Diagnosis Date Noted   Encounter for induction of labor 03/03/2021   Gestational hypertension 03/03/2021   Post-dates pregnancy 06/14/2018   GBS (group B Streptococcus carrier), +RV culture, currently pregnant 06/03/2018   Back pain affecting pregnancy in third trimester 03/18/2018   Supervision of normal pregnancy 11/25/2017   Obesity 01/12/2011   Objective: BP (!) 119/57    Pulse 77    Temp 98.7 F (37.1 C) (Oral)    Resp 18    Ht 5\' 7"  (1.702 m)    Wt 118.3 kg    LMP 12/26/2019    BMI 40.83 kg/m  No intake/output data recorded. No intake/output data recorded. NST: FHR baseline 135 bpm, Variability: moderate, Accelerations:present, Decelerations:  Absent= Cat 1/Reactive CTX:  irregular, every 1-4 minutes, lasting 40-60 seconds.  Uterus gravid, soft non tender, moderate to palpate with contractions.  SVE:  Dilation: 1.5 Effacement (%): 50 Station: -3 Exam by:: Boone Master, RN Pitocin at 0 mUn/min  Assessment:  Nyima Knaggs is a 31 y.o. female, [id number], IUP at 39.3 weeks, presenting for Elective IOL. H/O LGA (2nd baby 9+2, required VE assist.  This pregnancy, LGA at anatomy and on f/u, recheck 3rd trimester 02/14/2021 84%, 7.8lbs, vertex, anterior placenta, AFI 6.9, BPP 8/8.). Vit D def 18.2at NOB. 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  Recommend fetal echo, never  done.) Late entry into PNC at 15 weeks. GBS+. Pt feeling mild cxt off 1 po Cytotec.  Patient Active Problem List   Diagnosis Date Noted   Encounter for induction of labor 03/03/2021   Gestational hypertension 03/03/2021   Post-dates pregnancy 06/14/2018   GBS (group B Streptococcus carrier), +RV culture, currently pregnant 06/03/2018   Back pain affecting pregnancy in third trimester 03/18/2018   Supervision of normal pregnancy 11/25/2017   Obesity 01/12/2011   NICHD: Category 1  Membranes: Intact, no s/s of infection  Induction:    Cytotec x PO @ 1314 on 2/18  Foley Bulb: No  Pitocin - 0  Pain management:               IV pain management: x PRN  Nitrous: PRN             Epidural placement: PRN  GBS PCN  Abx: 2/18 @ 1317 , 1858  GHTN: BP 119/57, asymptomatic, PCR undetectable. Hgb 11, otherwise cbc and cmp unremarkable.    Plan: Continue labor plan Continuous/intermittent monitoring Rest Ambulate Frequent position changes to facilitate fetal rotation and descent. Will reassess with cervical exam at 4 hours or earlier if necessary GHTN: Monitor BP, if SR >160/110 will start mag and redraw levels.  Hold cytotec for now then continue cytotec per protocol Q4H vaginally if cxt space Anticipate pitocin 1x1 if cxt too much off cyctotec with no cervical change noted Anticipate labor progression and vaginal delivery.  Noralyn Pick, NP-C, CNM, MSN 03/03/2021. 8:08 PM

## 2021-03-03 NOTE — Progress Notes (Signed)
Labor Progress Note  Helen Curtis is a 31 y.o. female, [id number], IUP at 39.3 weeks, presenting for Elective IOL. H/O LGA (2nd baby 9+2, required VE assist.  This pregnancy, LGA at anatomy and on f/u, recheck 3rd trimester 02/14/2021 84%, 7.8lbs, vertex, anterior placenta, AFI 6.9, BPP 8/8.). Vit D def 18.2at NOB. 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  Recommend fetal echo, never done.) Late entry into PNC at 15 weeks. GBS+.  Pt endorse + Fm  Subjective: Pt stable, anxious about IOL process, reviewed R/B/A of Cytotec versus pitocin. Pt stable. Feeling mild cxt, has friend at bedside and supportive. Pt desires to eat.  Patient Active Problem List   Diagnosis Date Noted   Encounter for induction of labor 03/03/2021   Gestational hypertension 03/03/2021   Post-dates pregnancy 06/14/2018   GBS (group B Streptococcus carrier), +RV culture, currently pregnant 06/03/2018   Back pain affecting pregnancy in third trimester 03/18/2018   Supervision of normal pregnancy 11/25/2017   Obesity 01/12/2011   Objective: BP (!) 149/73    Pulse 91    Ht 5\' 7"  (1.702 m)    Wt 118.3 kg    LMP 12/26/2019    BMI 40.83 kg/m  No intake/output data recorded. No intake/output data recorded. NST: FHR baseline 140 bpm, Variability: moderate, Accelerations:present, Decelerations:  Absent= Cat 1/Reactive CTX:  irregular, every 2-4 minutes Uterus gravid, soft non tender, moderate to palpate with contractions.  SVE:  Dilation: 1 Effacement (%): 50 Station: Ballotable Exam by:: [email or web address], CNM Pitocin at 0 mUn/min  Assessment:  Helen Curtis is a 31 y.o. female, 38, IUP at 39.3 weeks, presenting for Elective IOL. H/O LGA (2nd baby 9+2, required VE assist.  This pregnancy, LGA at anatomy and on f/u, recheck 3rd trimester 02/14/2021 84%, 7.8lbs, vertex, anterior placenta, AFI 6.9, BPP 8/8.). Vit D def 18.2at NOB. Pt had h/o heart murmur (Born with "hole in heart", resolved  spontaneously.  Recommend fetal echo, never done.) Late entry into PNC at 15 weeks. GBS+. Pt feeling mild cxt off 1 po Cytotec.  Patient Active Problem List   Diagnosis Date Noted   Encounter for induction of labor 03/03/2021   Gestational hypertension 03/03/2021   Post-dates pregnancy 06/14/2018   GBS (group B Streptococcus carrier), +RV culture, currently pregnant 06/03/2018   Back pain affecting pregnancy in third trimester 03/18/2018   Supervision of normal pregnancy 11/25/2017   Obesity 01/12/2011   NICHD: Category 1  Membranes: Intact, no s/s of infection  Induction:    Cytotec x 01/14/2011 PO @ 1314 on 2/18  Foley Bulb: cervix closed   Pitocin - 0  Pain management:               IV pain management: x PRN  Nitrous: PRN             Epidural placement: PRN  GBS PCN  Abx: 2/18 @ 1317   GHTN: BP 149/73, asymptomatic, PCR undetectable. Hgb 11, otherwise cbc and cmp unremarkable.    Plan: Continue labor plan Continuous/intermittent monitoring Rest Ambulate Frequent position changes to facilitate fetal rotation and descent. Will reassess with cervical exam at 4 hours or earlier if necessary GHTN: Monitor BP, if SR >160/110 will start mag and redraw levels.  Hold cytotec for now dinner break then continue cytotec per protocol 3/18 Q4H vaginally Anticipate pitocin 2x2 if cxt too much off cyctotec Anticipate labor progression and vaginal delivery.   , NP-C, CNM, MSN 03/03/2021. 5:19  PM

## 2021-03-04 LAB — RPR: RPR Ser Ql: NONREACTIVE

## 2021-03-04 MED ORDER — OXYTOCIN-SODIUM CHLORIDE 30-0.9 UT/500ML-% IV SOLN
1.0000 m[IU]/min | INTRAVENOUS | Status: DC
  Administered 2021-03-04: 1 m[IU]/min via INTRAVENOUS
  Filled 2021-03-04: qty 500

## 2021-03-04 MED ORDER — PENICILLIN G POT IN DEXTROSE 60000 UNIT/ML IV SOLN
3.0000 10*6.[IU] | INTRAVENOUS | Status: DC
  Administered 2021-03-04 – 2021-03-05 (×5): 3 10*6.[IU] via INTRAVENOUS
  Filled 2021-03-04 (×5): qty 50

## 2021-03-04 MED ORDER — MISOPROSTOL 50MCG HALF TABLET
50.0000 ug | ORAL_TABLET | ORAL | Status: DC
  Administered 2021-03-04: 50 ug via BUCCAL
  Filled 2021-03-04: qty 1

## 2021-03-04 MED ORDER — OXYTOCIN-SODIUM CHLORIDE 30-0.9 UT/500ML-% IV SOLN
1.0000 m[IU]/min | INTRAVENOUS | Status: DC
  Administered 2021-03-04: 8 m[IU]/min via INTRAVENOUS
  Administered 2021-03-04: 2 m[IU]/min via INTRAVENOUS

## 2021-03-04 NOTE — Progress Notes (Addendum)
Subjective:    Walking in room, feeling frustrated about lack of progress  Objective:    VS: BP (!) 144/60    Pulse 79    Temp 98.5 F (36.9 C) (Oral)    Resp 16    Ht 5\' 7"  (1.702 m)    Wt 118.3 kg    LMP 12/26/2019    BMI 40.83 kg/m  FHR : baseline 140 / variability moderate / accelerations present / no decelerations Toco: contractions every 4 minutes/mild  Membranes: intact Dilation: 1.5 Effacement (%): 50 Cervical Position: Posterior Station: Ballotable Presentation: Vertex Exam by:: [email or web address] RN Pitocin 12 mU/min  Assessment/Plan:   31 y.o. 38 [redacted]w[redacted]d here for elective IOL s/p 3 doses of cytotec and pitocin. Cervix remains unchanged. Discussed options including continuing pitocin, attempting foley bulb or stopping pitocin and trying additional dose of cytotec. Patient desires to try one dose of cytotec. Will stop pitocin and give buccal cytotec.   Labor:  Continue IOL Preeclampsia:  labs stable Fetal Wellbeing:  Category I Pain Control:  Labor support without medications I/D:   GBS positive, PCN x 5 Anticipated MOD:  NSVD  Dr. [redacted]w[redacted]d updated  Su Hilt MSN, CNM 03/04/2021 6:44 PM

## 2021-03-04 NOTE — Progress Notes (Signed)
Subjective:    Feels occasional contractions  Objective:    VS: BP (!) 141/57    Pulse 78    Temp 98.5 F (36.9 C) (Oral)    Resp 16    Ht 5\' 7"  (1.702 m)    Wt 118.3 kg    LMP 12/26/2019    BMI 40.83 kg/m  FHR : baseline 145 / variability moderate / accelerations present / no decelerations Toco: contractions every 2-8 minutes  Membranes: intact Dilation: 1.5 Effacement (%): 50 Cervical Position: Posterior Station: Ballotable Presentation: Vertex Exam by:: [email or web address] RN Pitocin 8 mU/min  Assessment/Plan:   31 y.o. 38 [redacted]w[redacted]d  Labor:  IOL continues s/p 3 doses of cytotec. Will increase pit from 1x1 to 2x2.  Fetal Wellbeing:  Category I Pain Control:  Labor support without medications I/D:   GBS positive. PCN x 4 doses Anticipated MOD:  NSVD  [redacted]w[redacted]d MSN, CNM 03/04/2021 3:25 PM

## 2021-03-04 NOTE — Progress Notes (Signed)
Labor Progress Note  Helen Curtis is a 31 y.o. female, [id number], IUP at 39.3 weeks, presenting for Elective IOL. H/O LGA (2nd baby 9+2, required VE assist.  This pregnancy, LGA at anatomy and on f/u, recheck 3rd trimester 02/14/2021 84%, 7.8lbs, vertex, anterior placenta, AFI 6.9, BPP 8/8.). Vit D def 18.2at NOB. 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  Recommend fetal echo, never done.) Late entry into PNC at 15 weeks. GBS+.  Pt endorse + Fm  Subjective: Pt stable, in bed resting well, feeling cxt and tolerates well. Able to sleep through cxt.  Patient Active Problem List   Diagnosis Date Noted   Encounter for induction of labor 03/03/2021   Gestational hypertension 03/03/2021   Post-dates pregnancy 06/14/2018   GBS (group B Streptococcus carrier), +RV culture, currently pregnant 06/03/2018   Back pain affecting pregnancy in third trimester 03/18/2018   Supervision of normal pregnancy 11/25/2017   Obesity 01/12/2011   Objective: BP (!) 113/55    Pulse 79    Temp 98.2 F (36.8 C) (Oral)    Resp 16    Ht 5\' 7"  (1.702 m)    Wt 118.3 kg    LMP 12/26/2019    BMI 40.83 kg/m  No intake/output data recorded. No intake/output data recorded. NST: FHR baseline 140 bpm, Variability: moderate, Accelerations:present, Decelerations:  Absent= Cat 1/Reactive CTX:  irregular, every 3-7 minutes, lasting 60-80 seconds.  Uterus gravid, soft non tender, moderate to palpate with contractions.  SVE:  Dilation: 1.5 Effacement (%): 50 Station: -3 Exam by:: [email or web address], RN Pitocin at 0 mUn/min  Assessment:  Helen Curtis is a 31 y.o. female, 38, IUP at 39.3 weeks, presenting for Elective IOL. H/O LGA (2nd baby 9+2, required VE assist.  This pregnancy, LGA at anatomy and on f/u, recheck 3rd trimester 02/14/2021 84%, 7.8lbs, vertex, anterior placenta, AFI 6.9, BPP 8/8.). Vit D def 18.2at NOB. 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   Recommend fetal echo, never done.) Late entry into PNC at 15 weeks. GBS+. Pt feeling mild cxt off 3 po Cytotec.  Patient Active Problem List   Diagnosis Date Noted   Encounter for induction of labor 03/03/2021   Gestational hypertension 03/03/2021   Post-dates pregnancy 06/14/2018   GBS (group B Streptococcus carrier), +RV culture, currently pregnant 06/03/2018   Back pain affecting pregnancy in third trimester 03/18/2018   Supervision of normal pregnancy 11/25/2017   Obesity 01/12/2011   NICHD: Category 1  Membranes: Intact, no s/s of infection  Induction:    Cytotec x 01/14/2011 PO @ 1314 on 2/18, 2/19 @ 0006, 0415  Foley Bulb: No  Pitocin - 0  Pain management:               IV pain management: x PRN  Nitrous: PRN             Epidural placement: PRN  GBS PCN  Abx: 2/18 @ 1317 , 1858, 2/19 @ 0351  GHTN: BP 113/55, asymptomatic, PCR undetectable. Hgb 11, otherwise cbc and cmp unremarkable.    Plan: Continue labor plan Continuous monitoring Rest Ambulate Frequent position changes to facilitate fetal rotation and descent. Will reassess with cervical exam at 4 hours or earlier if necessary GHTN: Monitor BP, if SR >160/110 will start mag and redraw levels.  Cytotec for now then continue cytotec per protocol 3/19 Q4H vaginally if cxt space Anticipate pitocin 1x1 if cxt too much off cyctotec with no  cervical change noted Anticipate labor progression and vaginal delivery.   [place], NP-C, CNM, MSN 03/04/2021. 04:30 AM

## 2021-03-04 NOTE — Progress Notes (Signed)
Subjective:    Discussed possibility of baby being asynclitic. Patient open to binding belly and walking in hallways. Encouraged movement and then peanut ball if resting.   Objective:    VS: BP 128/69    Pulse 79    Temp 98.5 F (36.9 C) (Oral)    Resp 16    Ht 5\' 7"  (1.702 m)    Wt 118.3 kg    LMP 12/26/2019    BMI 40.83 kg/m  FHR : baseline 135 / variability moderate / accelerations present / no decelerations Toco: contractions every 2-5 minutes  Membranes: intact Dilation: 1.5 Effacement (%): 50 Cervical Position: Posterior Station: Ballotable Presentation: Vertex Exam by:: [email or web address] CNM   Assessment/Plan:   31 y.o. 32 [redacted]w[redacted]d presenting for Elective IOL. H/O LGA (2nd baby 9+2, required VE assist.  This pregnancy, LGA at anatomy and on f/u, recheck 3rd trimester 02/14/2021 84%, 7.8lbs, vertex, anterior placenta, AFI 6.9, BPP 8/8.). Vit D def 18.2at NOB. 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  Recommend fetal echo, never done.) Late entry into PNC at 15 weeks. GBS+.    Labor: Will plan to restart pitocin 4 hours after cytotec.  Preeclampsia:  labs stable, normotensive Fetal Wellbeing:  Category I Pain Control:  Labor support without medications I/D:   GBS PCN x 5 doses Anticipated MOD:  NSVD  04/14/2021 MSN, CNM 03/04/2021 9:36 PM

## 2021-03-04 NOTE — Progress Notes (Signed)
Labor Progress Note  Tiffeny Curnutt is a 31 y.o. female, [id number], IUP at 39.3 weeks, presenting for Elective IOL. H/O LGA (2nd baby 9+2, required VE assist.  This pregnancy, LGA at anatomy and on f/u, recheck 3rd trimester 02/14/2021 84%, 7.8lbs, vertex, anterior placenta, AFI 6.9, BPP 8/8.). Vit D def 18.2at NOB. 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  Recommend fetal echo, never done.) Late entry into PNC at 15 weeks. GBS+.  Pt endorse + Fm  Subjective: Pt stable, in bed resting well, feeling cxt and tolerates well. Able to sleep through cxt.  Patient Active Problem List   Diagnosis Date Noted   Encounter for induction of labor 03/03/2021   Gestational hypertension 03/03/2021   Post-dates pregnancy 06/14/2018   GBS (group B Streptococcus carrier), +RV culture, currently pregnant 06/03/2018   Back pain affecting pregnancy in third trimester 03/18/2018   Supervision of normal pregnancy 11/25/2017   Obesity 01/12/2011   Objective: BP (!) 113/55    Pulse 79    Temp 98.2 F (36.8 C) (Oral)    Resp 16    Ht 5\' 7"  (1.702 m)    Wt 118.3 kg    LMP 12/26/2019    BMI 40.83 kg/m  No intake/output data recorded. No intake/output data recorded. NST: FHR baseline 135 bpm, Variability: moderate, Accelerations:present, Decelerations:  Absent= Cat 1/Reactive CTX:  irregular, every 3-6 minutes, lasting 60-80 seconds.  Uterus gravid, soft non tender, moderate to palpate with contractions.  SVE:  Dilation: 1.5 Effacement (%): 50 Station: -3 Exam by:: Thersa Salt, RN Pitocin at 0 mUn/min  Assessment:  Shiana Grosse is a 31 y.o. female, [id number], IUP at 39.3 weeks, presenting for Elective IOL. H/O LGA (2nd baby 9+2, required VE assist.  This pregnancy, LGA at anatomy and on f/u, recheck 3rd trimester 02/14/2021 84%, 7.8lbs, vertex, anterior placenta, AFI 6.9, BPP 8/8.). Vit D def 18.2at NOB. 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   Recommend fetal echo, never done.) Late entry into PNC at 15 weeks. GBS+. Pt feeling mild cxt off 2 po Cytotec.  Patient Active Problem List   Diagnosis Date Noted   Encounter for induction of labor 03/03/2021   Gestational hypertension 03/03/2021   Post-dates pregnancy 06/14/2018   GBS (group B Streptococcus carrier), +RV culture, currently pregnant 06/03/2018   Back pain affecting pregnancy in third trimester 03/18/2018   Supervision of normal pregnancy 11/25/2017   Obesity 01/12/2011   NICHD: Category 1  Membranes: Intact, no s/s of infection  Induction:    Cytotec x 87mcg PO @ 1314 on 2/18, 2/19 @ 0006  Foley Bulb: No  Pitocin - 0  Pain management:               IV pain management: x PRN  Nitrous: PRN             Epidural placement: PRN  GBS PCN  Abx: 2/18 @ 1317 , 1858  GHTN: BP 113/55, asymptomatic, PCR undetectable. Hgb 11, otherwise cbc and cmp unremarkable.    Plan: Continue labor plan Continuous monitoring Rest Ambulate Frequent position changes to facilitate fetal rotation and descent. Will reassess with cervical exam at 4 hours or earlier if necessary GHTN: Monitor BP, if SR >160/110 will start mag and redraw levels.  Cytotec for now then continue cytotec per protocol 33mcg Q4H vaginally if cxt space Anticipate pitocin 1x1 if cxt too much off cyctotec with no cervical change noted Anticipate  labor progression and vaginal delivery.   Noralyn Pick, NP-C, CNM, MSN 03/04/2021. 12:30 AM

## 2021-03-04 NOTE — Progress Notes (Signed)
Subjective:    Resting in bed, feeling some contractions but sleeping through them  Objective:    VS: BP (!) 111/56    Pulse 86    Temp 98 F (36.7 C) (Oral)    Resp 16    Ht 5\' 7"  (1.702 m)    Wt 118.3 kg    LMP 12/26/2019    BMI 40.83 kg/m  FHR : baseline 130 / variability moderate / accelerations present / no decelerations Toco: contractions every 2-8 minutes Membranes: intact Dilation: 1.5 Effacement (%): 50 Cervical Position: Posterior Station: Ballotable Presentation: Vertex Exam by:: [email or web address] Helen Curtis, CNM Pitocin 0 mU/min  Assessment/Plan:   31 y.o. 38 [redacted]w[redacted]d presenting for Elective IOL. H/O LGA (2nd baby 9+2, required VE assist.  This pregnancy, LGA at anatomy and on f/u, recheck 3rd trimester 02/14/2021 84%, 7.8lbs, vertex, anterior placenta, AFI 6.9, BPP 8/8.). Vit D def 18.2at NOB. 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  Recommend fetal echo, never done.) Late entry into PNC at 15 weeks. GBS+.  NICHD: Category 1   Membranes: Intact, no s/s of infection   Induction:               Cytotec x 04/14/2021 PO @ 1314 on 2/18, 2/19 @ 0006, 0415             Foley Bulb: No - anterior cervix, difficult SVE for patient             Pitocin - will begin low dose pitocin   Pain management:               IV pain management: x PRN             Nitrous: PRN             Epidural placement: PRN   GBS PCN             Abx x 4 doses   GHTN: BP 111/56, asymptomatic, PCR undetectable. Hgb 11, otherwise cbc and cmp unremarkable.      Plan: Continue labor plan Continuous monitoring Rest Ambulate Frequent position changes to facilitate fetal rotation and descent. Will reassess with cervical exam at 4 hours or earlier if necessary GHTN: Monitor BP, if SR >160/110 will start mag and redraw levels.  Will begin low dose pitocin 1x1.  Anticipate labor progression and vaginal delivery.   3/19 MSN, CNM 03/04/2021 9:01 AM

## 2021-03-05 ENCOUNTER — Encounter (HOSPITAL_COMMUNITY): Payer: Self-pay | Admitting: Obstetrics and Gynecology

## 2021-03-05 ENCOUNTER — Inpatient Hospital Stay (HOSPITAL_COMMUNITY): Payer: Medicaid Other | Admitting: Anesthesiology

## 2021-03-05 MED ORDER — LIDOCAINE HCL (PF) 1 % IJ SOLN
INTRAMUSCULAR | Status: DC | PRN
  Administered 2021-03-05: 8 mL via EPIDURAL

## 2021-03-05 MED ORDER — FENTANYL-BUPIVACAINE-NACL 0.5-0.125-0.9 MG/250ML-% EP SOLN
12.0000 mL/h | EPIDURAL | Status: DC | PRN
  Administered 2021-03-05: 12 mL/h via EPIDURAL

## 2021-03-05 MED ORDER — PHENYLEPHRINE 40 MCG/ML (10ML) SYRINGE FOR IV PUSH (FOR BLOOD PRESSURE SUPPORT): 80.0000 ug | PREFILLED_SYRINGE | INTRAVENOUS | Status: DC | PRN

## 2021-03-05 MED ORDER — PRENATAL MULTIVITAMIN CH
1.0000 | ORAL_TABLET | Freq: Every day | ORAL | Status: DC
  Administered 2021-03-05 – 2021-03-06 (×2): 1 via ORAL
  Filled 2021-03-05 (×2): qty 1

## 2021-03-05 MED ORDER — DIPHENHYDRAMINE HCL 50 MG/ML IJ SOLN
12.5000 mg | INTRAMUSCULAR | Status: DC | PRN
  Administered 2021-03-05: 12.5 mg via INTRAVENOUS
  Filled 2021-03-05: qty 1

## 2021-03-05 MED ORDER — DIBUCAINE (PERIANAL) 1 % EX OINT: 1.0000 "application " | TOPICAL_OINTMENT | CUTANEOUS | Status: DC | PRN

## 2021-03-05 MED ORDER — SODIUM CHLORIDE 0.9 % IV SOLN
3.0000 g | Freq: Once | INTRAVENOUS | Status: AC
  Administered 2021-03-05: 3 g via INTRAVENOUS
  Filled 2021-03-05 (×2): qty 8

## 2021-03-05 MED ORDER — EPHEDRINE 5 MG/ML INJ: 10.0000 mg | INTRAVENOUS | Status: DC | PRN

## 2021-03-05 MED ORDER — IBUPROFEN 600 MG PO TABS
600.0000 mg | ORAL_TABLET | Freq: Four times a day (QID) | ORAL | Status: DC
  Administered 2021-03-05 – 2021-03-07 (×8): 600 mg via ORAL
  Filled 2021-03-05 (×8): qty 1

## 2021-03-05 MED ORDER — FENTANYL-BUPIVACAINE-NACL 0.5-0.125-0.9 MG/250ML-% EP SOLN
EPIDURAL | Status: AC
  Filled 2021-03-05: qty 250

## 2021-03-05 MED ORDER — DIPHENHYDRAMINE HCL 25 MG PO CAPS: 25.0000 mg | ORAL_CAPSULE | Freq: Four times a day (QID) | ORAL | Status: DC | PRN

## 2021-03-05 MED ORDER — ONDANSETRON HCL 4 MG PO TABS: 4.0000 mg | ORAL_TABLET | ORAL | Status: DC | PRN

## 2021-03-05 MED ORDER — LACTATED RINGERS IV SOLN: 500.0000 mL | Freq: Once | INTRAVENOUS | Status: DC

## 2021-03-05 MED ORDER — ONDANSETRON HCL 4 MG/2ML IJ SOLN
4.0000 mg | INTRAMUSCULAR | Status: DC | PRN
Start: 2021-03-05 — End: 2021-03-07

## 2021-03-05 MED ORDER — COCONUT OIL OIL
1.0000 "application " | TOPICAL_OIL | Status: DC | PRN
  Administered 2021-03-06: 1 via TOPICAL

## 2021-03-05 MED ORDER — PHENYLEPHRINE 40 MCG/ML (10ML) SYRINGE FOR IV PUSH (FOR BLOOD PRESSURE SUPPORT)
PREFILLED_SYRINGE | INTRAVENOUS | Status: AC
  Filled 2021-03-05: qty 10

## 2021-03-05 MED ORDER — TETANUS-DIPHTH-ACELL PERTUSSIS 5-2.5-18.5 LF-MCG/0.5 IM SUSY: 0.5000 mL | PREFILLED_SYRINGE | Freq: Once | INTRAMUSCULAR | Status: DC

## 2021-03-05 MED ORDER — SIMETHICONE 80 MG PO CHEW: 80.0000 mg | CHEWABLE_TABLET | ORAL | Status: DC | PRN

## 2021-03-05 MED ORDER — BENZOCAINE-MENTHOL 20-0.5 % EX AERO
1.0000 "application " | INHALATION_SPRAY | CUTANEOUS | Status: DC | PRN
  Filled 2021-03-05: qty 56

## 2021-03-05 MED ORDER — OXYCODONE HCL 5 MG PO TABS
5.0000 mg | ORAL_TABLET | ORAL | Status: DC | PRN
  Administered 2021-03-05 – 2021-03-06 (×2): 5 mg via ORAL
  Filled 2021-03-05 (×2): qty 1

## 2021-03-05 MED ORDER — ZOLPIDEM TARTRATE 5 MG PO TABS: 5.0000 mg | ORAL_TABLET | Freq: Every evening | ORAL | Status: DC | PRN

## 2021-03-05 MED ORDER — SENNOSIDES-DOCUSATE SODIUM 8.6-50 MG PO TABS
2.0000 | ORAL_TABLET | Freq: Every day | ORAL | Status: DC
  Administered 2021-03-06 – 2021-03-07 (×2): 2 via ORAL
  Filled 2021-03-05 (×2): qty 2

## 2021-03-05 MED ORDER — ACETAMINOPHEN 325 MG PO TABS
650.0000 mg | ORAL_TABLET | ORAL | Status: DC | PRN
  Administered 2021-03-05 – 2021-03-06 (×3): 650 mg via ORAL
  Filled 2021-03-05 (×3): qty 2

## 2021-03-05 MED ORDER — OXYCODONE HCL 5 MG PO TABS: 10.0000 mg | ORAL_TABLET | ORAL | Status: DC | PRN

## 2021-03-05 MED ORDER — WITCH HAZEL-GLYCERIN EX PADS: 1.0000 "application " | MEDICATED_PAD | CUTANEOUS | Status: DC | PRN

## 2021-03-05 MED ORDER — HYDROXYZINE HCL 25 MG PO TABS: 50.0000 mg | ORAL_TABLET | Freq: Three times a day (TID) | ORAL | Status: DC | PRN

## 2021-03-05 NOTE — Anesthesia Postprocedure Evaluation (Signed)
Anesthesia Post Note  Patient: Helen Curtis  Procedure(s) Performed: AN AD HOC LABOR EPIDURAL     Patient location during evaluation: Mother Baby Anesthesia Type: Epidural Level of consciousness: awake and alert and oriented Pain management: satisfactory to patient Vital Signs Assessment: post-procedure vital signs reviewed and stable Respiratory status: respiratory function stable Cardiovascular status: stable Postop Assessment: no headache, no backache, epidural receding, patient able to bend at knees, no signs of nausea or vomiting and adequate PO intake Anesthetic complications: no   No notable events documented.  Last Vitals:  Vitals:   03/05/21 1050 03/05/21 1126  BP: 131/67 128/63  Pulse: 94 63  Resp: 16 18  Temp: 36.8 C 37 C  SpO2: 100% 99%    Last Pain:  Vitals:   03/05/21 1126  TempSrc: Oral  PainSc:    Pain Goal:                   Rahkeem Senft

## 2021-03-05 NOTE — Anesthesia Preprocedure Evaluation (Signed)
Anesthesia Evaluation  Patient identified by MRN, date of birth, ID band Patient awake    Reviewed: Allergy & Precautions, H&P , NPO status , Patient's Chart, lab work & pertinent test results  Airway Mallampati: III   Neck ROM: full    Dental   Pulmonary neg pulmonary ROS,    breath sounds clear to auscultation       Cardiovascular hypertension,  Rhythm:regular Rate:Normal     Neuro/Psych    GI/Hepatic   Endo/Other  Morbid obesity  Renal/GU      Musculoskeletal   Abdominal   Peds  Hematology  (+) Blood dyscrasia, anemia ,   Anesthesia Other Findings   Reproductive/Obstetrics (+) Pregnancy                             Anesthesia Physical Anesthesia Plan  ASA: 3  Anesthesia Plan: Epidural   Post-op Pain Management:    Induction:   PONV Risk Score and Plan: 2 and Treatment may vary due to age or medical condition  Airway Management Planned: Natural Airway  Additional Equipment: None  Intra-op Plan:   Post-operative Plan:   Informed Consent: I have reviewed the patients History and Physical, chart, labs and discussed the procedure including the risks, benefits and alternatives for the proposed anesthesia with the patient or authorized representative who has indicated his/her understanding and acceptance.       Plan Discussed with: Anesthesiologist  Anesthesia Plan Comments:         Anesthesia Quick Evaluation

## 2021-03-05 NOTE — Progress Notes (Signed)
Pt c/o upper rib/ under breasts soreness and shortness of breath. HX anxiety. Lungs clear, normal heart sounds, O2 sat =100%. Vitals normal. Discussed with PT possible anxiety symptoms and offered medicine. PT declined at this time but will notify night shift RN if she changes her mind.

## 2021-03-05 NOTE — Lactation Note (Signed)
This note was copied from a baby's chart. Lactation Consultation Note  Patient Name: Helen Curtis Date: 03/05/2021 Reason for consult: Initial assessment;Mother's request;Term;Breastfeeding assistance Age:32 hours  LC assisted with latching infant in cross cradle prone with signs of milk transfer. Infant still feeding at the end of the visit.  Mom given heating packs for uterine cramping.   Plan 1. To feed based on cues 8-12x 24hr period. Mom to offer breasts and look for signs of milk transfer.  2. Mom requested hand pump for stimulation from RN. Any EBM offer spoon 5-7 ml per feeding.  3. Manual pump q 3hrs for 10 min each breast.  4. I and O sheet reviewed.   Mom electric pump from [facility] at home.  All questions answered at the end of the visit.   Maternal Data Has patient been taught Hand Expression?: Yes Does the patient have breastfeeding experience prior to this delivery?: Yes How long did the patient breastfeed?: 6 months for one child and 7 months for second child  Feeding Mother's Current Feeding Choice: Breast Milk  LATCH Score Latch: Repeated attempts needed to sustain latch, nipple held in mouth throughout feeding, stimulation needed to elicit sucking reflex.  Audible Swallowing: Spontaneous and intermittent  Type of Nipple: Everted at rest and after stimulation  Comfort (Breast/Nipple): Soft / non-tender  Hold (Positioning): Assistance needed to correctly position infant at breast and maintain latch.  LATCH Score: 8   Lactation Tools Discussed/Used Tools: Pump;Flanges;Coconut oil (Mom requested pump from RN. LC accessed flange size 24 comfortable fit.) Flange Size: 24 Breast pump type: Manual Pump Education: Setup, frequency, and cleaning;Milk Storage Pumping frequency: every 3 hrs for 10 min each breast  Interventions Interventions: Breast feeding basics reviewed;Assisted with latch;Skin to skin;Breast massage;Hand express;Breast  compression;Adjust position;Support pillows;Position options;Expressed milk;Coconut oil;Hand pump;Education;LC Psychologist, educational;Infant Driven Feeding Algorithm education  Discharge Pump: Manual;Personal WIC Program: No  Consult Status Consult Status: Follow-up Date: 03/06/21 Follow-up type: In-patient    Steffi Noviello  Nicholson-Springer 03/05/2021, 2:27 PM

## 2021-03-05 NOTE — Progress Notes (Signed)
Subjective:    Feeling more comfortable with epidural  Objective:    VS: BP (!) 99/45 Comment: pt side lying   Pulse (!) 127    Temp 98.2 F (36.8 C) (Oral)    Resp 16    Ht 5\' 7"  (1.702 m)    Wt 118.3 kg    LMP 12/26/2019    BMI 40.83 kg/m  FHR : baseline 125 / variability moderate / accelerations present / no decelerations Toco: contractions every 2-3 minutes  Membranes: intact Dilation: 3 Effacement (%): 80 Cervical Position: Posterior Station: -3 Presentation: Vertex Exam by:: [email or web address], RN Pitocin 8 mU/min  Assessment/Plan:   31 y.o. 38 [redacted]w[redacted]d here for elective IOL with GHTN dx this admission. S/P 4 doses of cytotec, currently on 25mU/min of pitocin.   Labor:  Early with regular, painful contraction Preeclampsia:  labs stable and normotensive with no symptoms Fetal Wellbeing:  Category I Pain Control:  Epidural I/D:  GBS positive - PCN x many doses Anticipated MOD:  NSVD  4m MSN, CNM 03/05/2021 6:42 AM

## 2021-03-05 NOTE — Progress Notes (Addendum)
Subjective:    Patient stated that she is feeling a little more uncomfortable with contractions  Objective:    VS: BP (!) 83/61 Comment: pt side lying   Pulse 89    Temp 98 F (36.7 C) (Oral)    Resp 16    Ht 5\' 7"  (1.702 m)    Wt 118.3 kg    LMP 12/26/2019    BMI 40.83 kg/m  FHR : baseline 140 / variability moderate / accelerations present / no decelerations Toco: contractions every 2-5 minutes  Membranes: intact Dilation: 2 Effacement (%): 50 Cervical Position: Posterior Station: Ballotable Presentation: Vertex Exam by:: Raquel Sarna Keshun Berrett Pitocin 10 mU/min  Assessment/Plan:   31 y.o. [id number] 100w5d s/p 4 doses cytotec now on pitocin. Vertex confirmed by bedside US.   Labor:  Remains in latent phases Fetal Wellbeing:  Category I Pain Control:  Labor support without medications Anticipated MOD:  Hopeful for NSVD  Dr. Mancel Bale updated  Kathalene Frames MSN, CNM 03/05/2021 2:21 AM

## 2021-03-05 NOTE — Anesthesia Procedure Notes (Signed)
Epidural Patient location during procedure: OB Start time: 03/05/2021 4:10 AM End time: 03/05/2021 4:22 AM  Staffing Anesthesiologist: Mellody Dance, MD Performed: anesthesiologist   Preanesthetic Checklist Completed: patient identified, IV checked, site marked, risks and benefits discussed, monitors and equipment checked, pre-op evaluation and timeout performed  Epidural Patient position: sitting Prep: DuraPrep Patient monitoring: heart rate, cardiac monitor, continuous pulse ox and blood pressure Approach: midline Location: L3-L4 Injection technique: LOR saline  Needle:  Needle type: Tuohy  Needle gauge: 17 G Needle length: 9 cm Needle insertion depth: 6 cm Catheter type: closed end flexible Catheter size: 20 Guage Catheter at skin depth: 11 cm Test dose: negative and Other  Assessment Events: blood not aspirated, injection not painful, no injection resistance and negative IV test  Additional Notes Informed consent obtained prior to proceeding including risk of failure, 1% risk of PDPH, risk of minor discomfort and bruising.  Discussed rare but serious complications including epidural abscess, permanent nerve injury, epidural hematoma.  Discussed alternatives to epidural analgesia and patient desires to proceed.  Timeout performed pre-procedure verifying patient name, procedure, and platelet count.  Patient tolerated procedure well.

## 2021-03-05 NOTE — Progress Notes (Signed)
Subjective:    Able to walk in hallway, now asleep  Objective:    VS: BP (!) 106/48    Pulse 85    Temp 98 F (36.7 C) (Oral)    Resp 16    Ht 5\' 7"  (1.702 m)    Wt 118.3 kg    LMP 12/26/2019    BMI 40.83 kg/m  FHR : baseline 140 / variability moderate / accelerations present / no decelerations Toco: contractions every 2-4 minutes  Membranes: intact Dilation: 2 Effacement (%): 50 Cervical Position: Posterior Station: Ballotable Presentation: Vertex Exam by:: [email or web address], RN Pitocin 2 mU/min  Assessment/Plan:   31 y.o. 38 [redacted]w[redacted]d s/p 4 doses of cytotec and pitocin break. Pitocin restarted and will titrate 2x2.   Labor:  Remains in latent phase Preeclampsia:  labs stable, normotensive Fetal Wellbeing:  Category I Pain Control:  Labor support without medications I/D:   GBS postive PCN x 6 doses Anticipated MOD:  Probable NSVD  [redacted]w[redacted]d MSN, CNM 03/05/2021 12:22 AM

## 2021-03-06 LAB — CBC
HCT: 28.8 % — ABNORMAL LOW (ref 36.0–46.0)
Hemoglobin: 9.6 g/dL — ABNORMAL LOW (ref 12.0–15.0)
MCH: 26.3 pg (ref 26.0–34.0)
MCHC: 33.3 g/dL (ref 30.0–36.0)
MCV: 78.9 fL — ABNORMAL LOW (ref 80.0–100.0)
Platelets: 158 10*3/uL (ref 150–400)
RBC: 3.65 MIL/uL — ABNORMAL LOW (ref 3.87–5.11)
RDW: 16.6 % — ABNORMAL HIGH (ref 11.5–15.5)
WBC: 7.1 10*3/uL (ref 4.0–10.5)
nRBC: 0 % (ref 0.0–0.2)

## 2021-03-06 MED ORDER — POLYSACCHARIDE IRON COMPLEX 150 MG PO CAPS
150.0000 mg | ORAL_CAPSULE | Freq: Every day | ORAL | Status: DC
Start: 2021-03-06 — End: 2021-03-07
  Administered 2021-03-06 – 2021-03-07 (×2): 150 mg via ORAL
  Filled 2021-03-06 (×2): qty 1

## 2021-03-06 MED ORDER — IBUPROFEN 600 MG PO TABS: 600.0000 mg | ORAL_TABLET | Freq: Four times a day (QID) | ORAL | 0 refills | Status: AC

## 2021-03-06 MED ORDER — ACETAMINOPHEN 325 MG PO TABS: 650.0000 mg | ORAL_TABLET | ORAL | Status: AC | PRN

## 2021-03-06 MED ORDER — POLYSACCHARIDE IRON COMPLEX 150 MG PO CAPS: 150.0000 mg | ORAL_CAPSULE | Freq: Every day | ORAL | 1 refills | Status: AC

## 2021-03-06 NOTE — Social Work (Signed)
CSW received consult for [name].CSW met with MOB to offer support and complete assessment.    CSW met with MOB at bedside and introduced CSW role. CSW observed MOB using the manual breast pump with her support holding the infant at bedside. MOB introduced her support as "my friend/sister Helen Curtis." MOB presented happy and engaged well with CSW. CSW offered MOB privacy. MOB welcomed CSW to complete the assessment with her friend present. CSW inquired how MOB has felt since giving birth. MOB reported feeling good, exhausted and shared that she had a long labor experience." MOB reported "this is it" indicating that this will be her last baby. MOB shared during the pregnancy she felt "horrible, sad, depressed with mixed emotions about the pregnancy." MOB reported for the past seven days she has felt anxious about the arrival of the infant, balancing three children and "anticipating how things are about to play out." MOB reported during the pregnancy she also had financial concerns that triggered anxiety. CSW inquired if MOB needed resources. MOB reported no resources needed and verbalize her familiarity with agencies in the area that help, if needed.  MOB reported she was diagnosed with anxiety in the 2019 and at the time prescribed medication for her symptoms however she stopped taking the medication since she did not like the side effects. MOB reported that she saw a therapist at [place] and felt it was meaningful and helpful. MOB expressed how she is open to seeing a therapist again. CSW inquired about MOB coping strategies. MOB reported she "sleeps and tunes the world out." MOB reflected on how she has made progress in not "tuning others out for as long as I use to." CSW validated MOB efforts and discussed additional coping strategies and offered coping strategy resources. MOB was receptive. CSW discussed PPD. 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 CSW provided education regarding the baby blues period vs. perinatal mood disorders, discussed treatment and gave resources for mental health follow up. CSW recommended MOB complete a self-evaluation during the postpartum time period using the New Mom Checklist from Postpartum Progress and encouraged MOB to contact a medical professional if symptoms are noted at any time. MOB denied thoughts of harm to self and others. CSW inquired about MOB additional support. MOB reported that she has several friends and family that are supportive.   MOB reported se has essential items for the infant including a bassinet where the infant will sleep. MOB reported that she can use additional assistance with diaper and wipes. CSW educated MOB about Manufacturing systems engineer and MOB gave CSW permission to make a referral. CSW provided review of Sudden Infant Death Syndrome (SIDS) precautions. MOB has chosen [facility] for the infant's follow up care.   CSW identifies no further need for intervention and no barriers to discharge at this time.  Helen Curtis, MSW, LCSW [facility] [place] Worker  [phone] 03/06/2021  11:57 AM

## 2021-03-06 NOTE — Lactation Note (Signed)
This note was copied from a baby's chart. Lactation Consultation Note  Patient Name: Helen Curtis [id number] Date: 03/06/2021 Reason for consult: Follow-up assessment;Term;Infant weight loss;Other (Comment) (LC reviewed  and updated do flow sheet per mom) Age:32 hours Baby fussy, LC offered to check and change diaper if needed and mom receptive.  Large wet and large stool ( transitional - yellow brown - seedy ).  Baby didn't settled after diaper. LC offered to assist to latch -  LC  noted areola to have some edema, compressible. LC showed mom reverse pressure exercise and baby latched with depth and was still feeding at 8 mins with swallows and per mom comfortable.  Latch score 9 .  LC recommended to offer both breast at a feeding and if baby still hungry to supplement formula and to keep it at 62ml or less.   Maternal Data Has patient been taught Hand Expression?: Yes  Feeding Mother's Current Feeding Choice: Breast Milk and Formula Nipple Type: Slow - flow  LATCH Score Latch: Grasps breast easily, tongue down, lips flanged, rhythmical sucking.  Audible Swallowing: Spontaneous and intermittent  Type of Nipple: Everted at rest and after stimulation  Comfort (Breast/Nipple): Soft / non-tender  Hold (Positioning): Assistance needed to correctly position infant at breast and maintain latch.  LATCH Score: 9   Lactation Tools Discussed/Used Tools: Pump;Flanges;Coconut oil Flange Size: 24 Breast pump type: Manual Pump Education: Milk Storage  Interventions Interventions: Breast feeding basics reviewed;Assisted with latch;Skin to skin;Breast massage;Hand express;Pre-pump if needed;Breast compression;Adjust position;Support pillows;Position options;Coconut oil;Hand pump;Education;LC Services brochure  Discharge Pump: Personal;DEBP;Manual  Consult Status Consult Status: Follow-up Date: 03/07/21 Follow-up type: In-patient    [place] 03/06/2021, 6:21  PM

## 2021-03-06 NOTE — Discharge Summary (Addendum)
SVD OB Discharge Summary  Patient Name: Helen Curtis DOB: [birth year] MRN: [id number]  Date of admission: 03/03/2021 Intrauterine pregnancy: [redacted]w[redacted]d   Admitting diagnosis: Encounter for induction of labor [Z34.90] Secondary diagnosis: SVD  Date of discharge: 03/07/2021    Discharge diagnosis: Term Pregnancy Delivered and gestational hypertension  Prenatal history: [id number]   EDC : 03/07/2021, Alternate EDD Entry  Prenatal care at [facility]  Primary provider : [facility] Prenatal course complicated by Anxiety  Prenatal Labs: ABO, Rh: --/--/B POS (02/18 1241) /  Antibody: NEG (02/18 1241) Rubella: Immune (09/01 0000)  /  RPR: NON REACTIVE (02/18 1241)  HBsAg: Negative (09/01 0000)  HIV: Non-reactive (09/01 0000)  GBS: Positive/-- (02/01 0000)                                    Hospital course:  Induction of Labor With Vaginal Delivery   31 y.o. yo [id number] at [redacted]w[redacted]d was admitted to the hospital 03/03/2021 for induction of labor.  Indication for induction: Elective.  Patient had an uncomplicated labor course as follows: Membrane Rupture Time/Date: 7:00 AM ,03/05/2021   Delivery Method:Vaginal, Spontaneous  Episiotomy: None  Lacerations:  1st degree  Details of delivery can be found in separate delivery note.  Patient had a routine postpartum course. Patient is discharged home 03/06/21.  Newborn Data: Birth date:03/05/2021  Birth time:8:02 AM  Gender:Female  Living status:Living  Apgars:8 ,9  Weight:3788 g  Delivering PROVIDER: Johney Frame B                                                            Complications: Manual removal of placenta. Appeared intact. Unasyn 3gms IV given.  Newborn Data: Live born female  Birth Weight: 8 lb 5.6 oz (3788 g) APGAR: 8, 9  Newborn Delivery   Birth date/time: 03/05/2021 08:02:00 Delivery type: Vaginal, Spontaneous      Baby Feeding: Bottle and Breast Disposition:home with mother  Post partum procedures: PO iron  Labs: Lab Results   Component Value Date   WBC 7.1 03/06/2021   HGB 9.6 (L) 03/06/2021   HCT 28.8 (L) 03/06/2021   MCV 78.9 (L) 03/06/2021   PLT 158 03/06/2021   CMP Latest Ref Rng & Units 03/03/2021  Glucose 70 - 99 mg/dL 99  BUN 6 - 20 mg/dL 5(L)  Creatinine 9.97 - 1.00 mg/dL 7.41  Sodium 423 - 953 mmol/L 136  Potassium 3.5 - 5.1 mmol/L 3.6  Chloride 98 - 111 mmol/L 107  CO2 22 - 32 mmol/L 20(L)  Calcium 8.9 - 10.3 mg/dL 2.0(E)  Total Protein 6.5 - 8.1 g/dL 6.3(L)  Total Bilirubin 0.3 - 1.2 mg/dL 0.5  Alkaline Phos 38 - 126 U/L 189(H)  AST 15 - 41 U/L 26  ALT 0 - 44 U/L 14    Physical Exam @ time of discharge:  Vitals:   03/05/21 1126 03/05/21 1500 03/05/21 2214 03/06/21 0527  BP: 128/63 123/70 128/81 120/78  Pulse: 63 85 81 79  Resp: 18 16 18 18   Temp: 98.6 F (37 C) 99.1 F (37.3 C) 98.8 F (37.1 C) 98.2 F (36.8 C)  TempSrc: Oral Oral Oral Oral  SpO2: 99% 99% 100%   Weight:  Height:       general: alert, cooperative, and no distress lochia: appropriate uterine fundus: firm perineum: 1st degree well approximated incision: N/A extremities: DVT Evaluation: No evidence of DVT seen on physical exam. Negative Homan's sign. No cords or calf tenderness. Trace significant calf/ankle edema.  Discharge instructions:  "Baby and Me Booklet"  Discharge Medications:  Allergies as of 03/06/2021   No Known Allergies      Medication List     STOP taking these medications    cyclobenzaprine 10 MG tablet Commonly known as: FLEXERIL       TAKE these medications    acetaminophen 325 MG tablet Commonly known as: Tylenol Take 2 tablets (650 mg total) by mouth every 4 (four) hours as needed (for pain scale < 4).   ibuprofen 600 MG tablet Commonly known as: ADVIL Take 1 tablet (600 mg total) by mouth every 6 (six) hours.   iron polysaccharides 150 MG capsule Commonly known as: NIFEREX Take 1 capsule (150 mg total) by mouth daily.   multivitamin-prenatal 27-0.8 MG Tabs  tablet Take 1 tablet by mouth daily at 12 noon.       Diet: routine diet Activity: Advance as tolerated. Pelvic rest x 6 weeks.  Follow up:1 week for blood pressure check and then in 6 weeks for postpartum exam  Signed: Roma Schanz MSN, CNM 03/07/2021, 11:08 AM

## 2021-03-07 LAB — SURGICAL PATHOLOGY

## 2021-03-07 NOTE — Lactation Note (Signed)
This note was copied from a baby's chart. Lactation Consultation Note  Patient Name: Boy Sheran Newstrom [id number] Date: 03/07/2021 Reason for consult: Follow-up assessment;Term Age:32 hours   P3 mother whose infant is now 42 hours old.  This is a term baby at 39+5 weeks.  Mother's current feeding preference is breast/formula.  Mother has been breast feeding and supplementing with Enfamil 20 calorie formula.  She had no questions/concerns related to breast feeding.  Last LATCH score was a 9; mulitple voids/stools.  Stools are transitioning.  Encouraged to latch prior to giving formula supplementation to help ensure good breast stimulation.  Mother verbalized understanding.    Mother has our OP phone number for any questions after discharge.  Support person present.   Maternal Data Has patient been taught Hand Expression?: Yes Does the patient have breastfeeding experience prior to this delivery?: Yes  Feeding Mother's Current Feeding Choice: Breast Milk and Formula Nipple Type: Slow - flow  LATCH Score                    Lactation Tools Discussed/Used    Interventions    Discharge Discharge Education: Engorgement and breast care Pump: Personal  Consult Status Consult Status: Complete Date: 03/07/21 Follow-up type: Call as needed    Aneta Hendershott R Tiya Schrupp 03/07/2021, 11:13 AM

## 2021-03-07 NOTE — Progress Notes (Signed)
PPD# 2 SVD w/ 1st degree perineal laceration Information for the patient's newborn:  Avanya, Corsa [id number]  female    Circumcision completed 03/06/21   S:   Reports feeling good Tolerating PO fluid and solids No nausea or vomiting Bleeding is light, no clots Pain controlled with  PO meds Up ad lib / ambulatory / voiding w/o difficulty Feeding: Bottle, has discontinued breastfeeding.   O:   VS: BP 124/72 (BP Location: Right Arm)    Pulse 91    Temp 97.9 F (36.6 C) (Oral)    Resp 17    Ht 5\' 7"  (1.702 m)    Wt 118.3 kg    LMP 12/26/2019    SpO2 99%    Breastfeeding Unknown    BMI 40.83 kg/m   LABS:  Recent Labs    03/06/21 0512  WBC 7.1  HGB 9.6*  PLT 158   Blood type: --/--/B POS (02/18 1241) Rubella: Immune (09/01 0000)                      I&O: Intake/Output      02/21 0701 02/22 0700 02/22 0701 02/23 0700   I.V. (mL/kg) 0 (0)    Other 0    Total Intake(mL/kg) 0 (0)    Urine (mL/kg/hr)     Blood     Total Output     Net 0           Physical Exam: Alert and oriented X3 Lungs: Clear and unlabored Heart: regular rate and rhythm / no mumurs Abdomen: soft, non-tender, non-distended  Fundus: firm, non-tender, U-2 Perineum: healing Lochia: appropriate Extremities: trace, non-pitting edema, no calf pain, tenderness, or cords    A:  PPD # 1  Normal exam GHTN  P:  Routine post partum orders Follow-up in 1 week for BP check Anticipate D/C on today   Plan reviewed w/ Dr. Barry Dienes, MSN, CNM 03/07/2021, 10:56 AM

## 2021-03-08 ENCOUNTER — Other Ambulatory Visit: Payer: Self-pay

## 2021-03-08 ENCOUNTER — Inpatient Hospital Stay (HOSPITAL_COMMUNITY)
Admission: AD | Admit: 2021-03-08 | Discharge: 2021-03-09 | Disposition: A | Discharge status: Home / Self Care | Payer: Medicaid Other | Attending: Obstetrics and Gynecology | Admitting: Obstetrics and Gynecology

## 2021-03-08 ENCOUNTER — Encounter (HOSPITAL_COMMUNITY): Payer: Self-pay | Admitting: Obstetrics and Gynecology

## 2021-03-08 DIAGNOSIS — O9089 Other complications of the puerperium, not elsewhere classified: Secondary | ICD-10-CM | POA: Diagnosis present

## 2021-03-08 DIAGNOSIS — O99345 Other mental disorders complicating the puerperium: Secondary | ICD-10-CM | POA: Insufficient documentation

## 2021-03-08 DIAGNOSIS — R0781 Pleurodynia: Secondary | ICD-10-CM | POA: Insufficient documentation

## 2021-03-08 DIAGNOSIS — R0602 Shortness of breath: Secondary | ICD-10-CM | POA: Diagnosis not present

## 2021-03-08 DIAGNOSIS — R600 Localized edema: Secondary | ICD-10-CM | POA: Diagnosis not present

## 2021-03-08 DIAGNOSIS — R5383 Other fatigue: Secondary | ICD-10-CM | POA: Insufficient documentation

## 2021-03-08 NOTE — MAU Note (Signed)
..  Helen Curtis is a 31 y.o. at [redacted]w[redacted]d here in MAU reporting: PP HA, blurry vision, red and swollen feet, generalized weakness, difficulty walking, pain in ribs bilaterally, and reports she passed two dark red clots that were thick and long. Pt states lochia slowed down. Pt states the HA started today and she took Tylenol twice last at 2100 and Motrin around 1200 with no relief. Pt delivered Monday 03/05/2021 Vag Spont, and discharged yesterday. Pt reports BP was elevated intermittent during her labor and PP. Not sent home with any BP medications.    Pain score: 9/10 Generalized, 6/10 rib Vitals:   03/08/21 2316 03/08/21 2320  BP: 124/76   Pulse: 91   Resp: 18   Temp:  98.2 F (36.8 C)  SpO2: 100%       Lab orders placed from triage:  none

## 2021-03-08 NOTE — MAU Provider Note (Signed)
Chief Complaint:  Hypertension   Event Date/Time   First Provider Initiated Contact with Patient 03/08/21 2349       HPI: Helen Curtis is a 31 y.o. [id number] who is Postpartum Day #3 presents to maternity admissions reporting generalized discomfort, new severe pedal edema (painful), passing two blood clots, rib pain (present before discharge from hospital), Headache.  States is alone at home with little support.  States Tylenol did not help headache.  States told providers she felt bad postpartum but has not called them since then. States she is worried she has retained placenta "because they said they had to send the placenta in chunks to be examined".  (Delivery note does not state placenta was in chunks but cord avulsed and "appeared fragmented but intact")  .Pathology does not report placenta was fragmented. She reports vaginal bleeding, no vaginal itching/burning, urinary symptoms,  dizziness, n/v, or fever/chills.    Headache  This is a recurrent problem. The current episode started yesterday. The problem occurs constantly. The problem has been unchanged. Associated symptoms include blurred vision and a visual change. Pertinent negatives include no abdominal pain, back pain, dizziness, fever, muscle aches, nausea, photophobia or vomiting. Nothing aggravates the symptoms. She has tried acetaminophen for the symptoms. The treatment provided no relief.  Vaginal Bleeding The patient's primary symptoms include pelvic pain and vaginal bleeding (two clots but light bleeding). The patient's pertinent negatives include no genital itching, genital lesions or genital odor. This is a recurrent problem. The pain is mild. She is not pregnant. Associated symptoms include headaches. Pertinent negatives include no abdominal pain, back pain, fever, nausea or vomiting. The vaginal bleeding is lighter than menses. She has been passing clots. She has not been passing tissue. Nothing aggravates the symptoms. She has  tried nothing for the symptoms.   RN Note: Helen Curtis is a 31 y.o. at 100w5d here in MAU reporting: PP HA, blurry vision, red and swollen feet, generalized weakness, difficulty walking, pain in ribs bilaterally, and reports she passed two dark red clots that were thick and long. Pt states lochia slowed down. Pt states the HA started today and she took Tylenol twice last at 2100 and Motrin around 1200 with no relief. Pt delivered Monday 03/05/2021 Vag Spont, and discharged yesterday. Pt reports BP was elevated intermittent during her labor and PP. Not sent home with any BP medications.   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"   History of chicken pox    History of chlamydia    No pertinent past medical history     Past obstetric history: OB History  Gravida Para Term Preterm AB Living  5 3 3  0 2 3  SAB IAB Ectopic Multiple Live Births  0 2 0 0 3    # Outcome Date GA Lbr Len/2nd Weight Sex Delivery Anes PTL Lv  5 Term 03/05/21 [redacted]w[redacted]d 02:58 / 00:24 3788 g M Vag-Spont EPI  LIV     Birth Comments: wnl  4 Term 06/15/18 [redacted]w[redacted]d / 04:50 4190 g M Vag-Vacuum EPI  LIV  3 Term 01/12/11 [redacted]w[redacted]d 17:39 / 00:39 3184 g F Vag-Spont EPI  LIV     Birth Comments: Molding & R sided caput  2 IAB           1 IAB  Past Surgical History: Past Surgical History:  Procedure Laterality Date   NO PAST SURGERIES     THERAPEUTIC ABORTION      Family History: Family History  Problem Relation Age of Onset   Hypertension Mother    Seizures Mother    Hypertension Maternal Grandmother    Diabetes Maternal Grandmother    Diabetes Maternal Grandfather    Cancer Paternal Grandmother    Alzheimer's disease Paternal Grandmother    Cancer Paternal Grandfather    Anesthesia problems Neg Hx     Social History: Social History   Tobacco Use   Smoking status: Never    Smokeless tobacco: Never  Vaping Use   Vaping Use: Never used  Substance Use Topics   Alcohol use: Not Currently   Drug use: No    Allergies: No Known Allergies  Meds:  Medications Prior to Admission  Medication Sig Dispense Refill Last Dose   acetaminophen (TYLENOL) 325 MG tablet Take 2 tablets (650 mg total) by mouth every 4 (four) hours as needed (for pain scale < 4).   03/08/2021 at 2100   ibuprofen (ADVIL) 600 MG tablet Take 1 tablet (600 mg total) by mouth every 6 (six) hours. 30 tablet 0 03/08/2021 at 1400   iron polysaccharides (NIFEREX) 150 MG capsule Take 1 capsule (150 mg total) by mouth daily. 30 capsule 1 03/08/2021 at 1400   Prenatal Vit-Fe Fumarate-FA (MULTIVITAMIN-PRENATAL) 27-0.8 MG TABS tablet Take 1 tablet by mouth daily at 12 noon.   Past Week    I have reviewed patient's Past Medical Hx, Surgical Hx, Family Hx, Social Hx, medications and allergies.  ROS:  Review of Systems  Constitutional:  Negative for fever.  Eyes:  Positive for blurred vision. Negative for photophobia.  Gastrointestinal:  Negative for abdominal pain, nausea and vomiting.  Genitourinary:  Positive for pelvic pain and vaginal bleeding.  Musculoskeletal:  Negative for back pain.  Neurological:  Positive for headaches. Negative for dizziness.  Other systems negative     Physical Exam  Patient Vitals for the past 24 hrs:  BP Temp Temp src Pulse Resp SpO2 Height Weight  03/08/21 2320 -- 98.2 F (36.8 C) -- -- -- -- -- --  03/08/21 2316 124/76 -- Oral 91 18 100 % 5\' 7"  (1.702 m) 114.6 kg   Constitutional: Well-developed, well-nourished female in no acute distress.  Cardiovascular: normal rate and rhythm, no ectopy audible, S1 & S2 heard, no murmur Respiratory: normal effort, no distress. Does not appear to be short of breath.   Lungs CTAB with no wheezes or crackles GI: Abd soft, non-tender.  Nondistended.  No rebound, No guarding.  Bowel Sounds audible  MS: Extremities nontender,  2+ lower  extremity edema, normal ROM Neurologic: Alert and oriented x 4.   Grossly nonfocal. GU: Neg CVAT.   Uterus with fundus at umbilicus, slightly tender. Small lochia.   Skin:  Warm and Dry Psych:  Affect appropriate.   Labs: Results for orders placed or performed during the hospital encounter of 03/08/21 (from the past 24 hour(s))  CBC     Status: Abnormal   Collection Time: 03/09/21 12:46 AM  Result Value Ref Range   WBC 7.5 4.0 - 10.5 K/uL   RBC 4.02 3.87 - 5.11 MIL/uL   Hemoglobin 10.4 (L) 12.0 - 15.0 g/dL   HCT 32.5 (L) 36.0 - 46.0 %   MCV 80.8 80.0 - 100.0 fL   MCH 25.9 (L) 26.0 - 34.0 pg   MCHC 32.0 30.0 -  36.0 g/dL   RDW 16.9 (H) 11.5 - 15.5 %   Platelets 234 150 - 400 K/uL   nRBC 0.0 0.0 - 0.2 %  Basic metabolic panel     Status: None   Collection Time: 03/09/21 12:46 AM  Result Value Ref Range   Sodium 139 135 - 145 mmol/L   Potassium 4.2 3.5 - 5.1 mmol/L   Chloride 109 98 - 111 mmol/L   CO2 23 22 - 32 mmol/L   Glucose, Bld 96 70 - 99 mg/dL   BUN 9 6 - 20 mg/dL   Creatinine, Ser 0.58 0.44 - 1.00 mg/dL   Calcium 9.0 8.9 - 10.3 mg/dL   GFR, Estimated >60 >60 mL/min   Anion gap 7 5 - 15    --/--/B POS (02/18 1241)  Imaging:  DG Chest 2 View  Result Date: 03/09/2021 CLINICAL DATA:  Rib pain, dyspnea EXAM: CHEST - 2 VIEW COMPARISON:  06/10/2019 FINDINGS: The heart size and mediastinal contours are within normal limits. Both lungs are clear. The visualized skeletal structures are unremarkable. IMPRESSION: No active cardiopulmonary disease. Electronically Signed   By: Fidela Salisbury M.D.   On: 03/09/2021 02:28   US Pelvis Limited  Result Date: 03/09/2021 CLINICAL DATA:  Vaginal delivery 03/05/2021, postpartum vaginal bleeding EXAM: TRANSABDOMINAL ULTRASOUND OF PELVIS TECHNIQUE: Transabdominal ultrasound examination of the pelvis was performed including evaluation of the uterus, ovaries, adnexal regions, and pelvic cul-de-sac. COMPARISON:  None. FINDINGS: Uterus  Measurements: 21.6 x 9.8 x 15.1 cm = volume: 1,674 mL. The uterus is anteverted. Numerous dilated subserosal vessels are identified within the uterine fundus in keeping with the given history of recent gestation. No focal intrauterine masses are identified. The cervix is not optimally assessed on this examination, but is unremarkable. Endometrium Thickness: 7 mm. Trace fluid is seen within the endometrial cavity, nonspecific. Several echogenic shadowing foci are noted within the subendometrial lower uterine segment, likely representing tiny calcifications and possibly the sequela of remote trauma or inflammation. No vascularized endometrial soft tissue mass is identified. Right ovary Measurements: 4.5 x 1.6 x 4.1 cm = volume: 15 mL. Normal appearance/no adnexal mass. Left ovary Measurements: 3.3 x 2.0 x 3.7 cm = volume: 13 mL. Normal appearance/no adnexal mass. Other findings:  No abnormal free fluid. IMPRESSION: Unremarkable postpartum examination. No retained products of conception identified. Electronically Signed   By: Fidela Salisbury M.D.   On: 03/09/2021 01:51     MAU Course/MDM: I have ordered labs as follows:  CBC Bmet, both normal Hgb is higher than that at discharge.  Imaging ordered: CXR is normal, no pneumonia or edema.  US uterus shows no retained POC Results reviewed.  Treatments in MAU included Oxy IR x 5mg  and one dose of Lasix 10mg   Advised her to call office in AM to see if they want to put her on a few days of diuretic or check her BP. Marland Kitchen   Pt stable at time of discharge.  Assessment: Postpartum Day #3 Shortness of breath Rib pain, Pedal edema, new Postpartum anxiety and fatigue  Plan: Discharge home Recommend call office for followup Preeclampsia precautions Ibuprofen for pain  Encouraged to return here or to other Urgent Care/ED if she develops worsening of symptoms, increase in pain, fever, or other concerning symptoms.   Hansel Feinstein CNM, MSN Certified  Nurse-Midwife 03/08/2021 11:49 PM

## 2021-03-09 ENCOUNTER — Inpatient Hospital Stay (HOSPITAL_COMMUNITY): Payer: Medicaid Other

## 2021-03-09 DIAGNOSIS — F418 Other specified anxiety disorders: Secondary | ICD-10-CM

## 2021-03-09 DIAGNOSIS — O99345 Other mental disorders complicating the puerperium: Secondary | ICD-10-CM

## 2021-03-09 DIAGNOSIS — R0602 Shortness of breath: Secondary | ICD-10-CM

## 2021-03-09 DIAGNOSIS — R0781 Pleurodynia: Secondary | ICD-10-CM

## 2021-03-09 DIAGNOSIS — R600 Localized edema: Secondary | ICD-10-CM

## 2021-03-09 LAB — BASIC METABOLIC PANEL
Anion gap: 7 (ref 5–15)
BUN: 9 mg/dL (ref 6–20)
CO2: 23 mmol/L (ref 22–32)
Calcium: 9 mg/dL (ref 8.9–10.3)
Chloride: 109 mmol/L (ref 98–111)
Creatinine, Ser: 0.58 mg/dL (ref 0.44–1.00)
GFR, Estimated: >60 mL/min (ref >60)
Glucose, Bld: 96 mg/dL (ref 70–99)
Potassium: 4.2 mmol/L (ref 3.5–5.1)
Sodium: 139 mmol/L (ref 135–145)

## 2021-03-09 LAB — CBC
HCT: 32.5 % — ABNORMAL LOW (ref 36.0–46.0)
Hemoglobin: 10.4 g/dL — ABNORMAL LOW (ref 12.0–15.0)
MCH: 25.9 pg — ABNORMAL LOW (ref 26.0–34.0)
MCHC: 32 g/dL (ref 30.0–36.0)
MCV: 80.8 fL (ref 80.0–100.0)
Platelets: 234 10*3/uL (ref 150–400)
RBC: 4.02 MIL/uL (ref 3.87–5.11)
RDW: 16.9 % — ABNORMAL HIGH (ref 11.5–15.5)
WBC: 7.5 10*3/uL (ref 4.0–10.5)
nRBC: 0 % (ref 0.0–0.2)

## 2021-03-09 MED ORDER — OXYCODONE HCL 5 MG PO TABS
5.0000 mg | ORAL_TABLET | Freq: Once | ORAL | Status: AC
  Administered 2021-03-09: 5 mg via ORAL
  Filled 2021-03-09: qty 1

## 2021-03-09 MED ORDER — FUROSEMIDE 20 MG PO TABS
10.0000 mg | ORAL_TABLET | Freq: Once | ORAL | Status: AC
Start: 2021-03-09 — End: 2021-03-09
  Administered 2021-03-09: 10 mg via ORAL
  Filled 2021-03-09: qty 0.5

## 2021-03-11 ENCOUNTER — Encounter (HOSPITAL_COMMUNITY): Payer: Self-pay | Admitting: Obstetrics & Gynecology

## 2021-03-11 ENCOUNTER — Inpatient Hospital Stay (HOSPITAL_COMMUNITY)
Admission: AD | Admit: 2021-03-11 | Discharge: 2021-03-11 | Disposition: A | Discharge status: Home / Self Care | Payer: Medicaid Other | Attending: Obstetrics & Gynecology | Admitting: Obstetrics & Gynecology

## 2021-03-11 ENCOUNTER — Other Ambulatory Visit: Payer: Self-pay

## 2021-03-11 DIAGNOSIS — R609 Edema, unspecified: Secondary | ICD-10-CM | POA: Insufficient documentation

## 2021-03-11 DIAGNOSIS — R1030 Lower abdominal pain, unspecified: Secondary | ICD-10-CM | POA: Insufficient documentation

## 2021-03-11 NOTE — MAU Note (Addendum)
Presents stating she passed a large blood clot, reports VB isn't heavy.  Reports was laying down, got up and started moving around, used the restroom & passed a clot the size of a quarter or fifty cent piece.  Reports here 2 days ago for same complaint. S/P NVD 03/05/2021.

## 2021-03-11 NOTE — MAU Provider Note (Cosign Needed)
History     CSN: [id number]  Arrival date and time: 03/11/21 6553   Event Date/Time   First Provider Initiated Contact with Patient 03/11/21 0831      Chief Complaint  Patient presents with   Passing blood clots   30 y.o. G5P3 s/p SVD 6 days ago presenting for passing a clot. Reports seeing a pink stringy clot when she wiped, states she had to pull it out of her. Bleeding has been minimal to moderate, she is unsure how often changing pads. States lochia has been watery. Reports some lower abdominal cramping. Rates 3/10. She is also concerned about the swelling in her legs and feet. Was seen in MAU 3 days ago for passing a clot and had negative pelvic US.   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"   History of chicken pox    History of chlamydia    No pertinent past medical history     Past Surgical History:  Procedure Laterality Date   NO PAST SURGERIES     THERAPEUTIC ABORTION      Family History  Problem Relation Age of Onset   Hypertension Mother    Seizures Mother    Hypertension Maternal Grandmother    Diabetes Maternal Grandmother    Diabetes Maternal Grandfather    Cancer Paternal Grandmother    Alzheimer's disease Paternal Grandmother    Cancer Paternal Grandfather    Anesthesia problems Neg Hx     Social History   Tobacco Use   Smoking status: Never   Smokeless tobacco: Never  Vaping Use   Vaping Use: Never used  Substance Use Topics   Alcohol use: Not Currently   Drug use: No    Allergies: No Known Allergies  Medications Prior to Admission  Medication Sig Dispense Refill Last Dose   acetaminophen (TYLENOL) 325 MG tablet Take 2 tablets (650 mg total) by mouth every 4 (four) hours as needed (for pain scale  < 4).      ibuprofen (ADVIL) 600 MG tablet Take 1 tablet (600 mg total) by mouth every 6 (six) hours. 30 tablet 0    iron polysaccharides (NIFEREX) 150 MG capsule Take 1 capsule (150 mg total) by mouth daily. 30 capsule 1    Prenatal Vit-Fe Fumarate-FA (MULTIVITAMIN-PRENATAL) 27-0.8 MG TABS tablet Take 1 tablet by mouth daily at 12 noon.       Review of Systems  Constitutional:  Negative for fever.  Gastrointestinal:  Positive for abdominal pain.  Genitourinary:  Positive for vaginal bleeding.  Physical Exam   Blood pressure 131/74, pulse 79, temperature 98.4 F (36.9 C), temperature source Oral, resp. rate 20, height 5\' 7"  (1.702 m), weight 110.3 kg, SpO2 100 %, unknown if currently breastfeeding.  Physical Exam Vitals and nursing note reviewed. Exam conducted with a chaperone present.  Constitutional:      General: She is not in acute distress.    Appearance: Normal appearance.  HENT:     Head: Normocephalic.  Cardiovascular:  Rate and Rhythm: Normal rate.  Pulmonary:     Effort: Pulmonary effort is normal. No respiratory distress.  Genitourinary:    Comments: External: no lesions or erythema Vagina: rugated, pink, moist, scant bloody discharge Cervix closed   Musculoskeletal:        General: Swelling: 2+ LE. Normal range of motion.     Cervical back: Normal range of motion.  Skin:    General: Skin is warm and dry.  Neurological:     General: No focal deficit present.     Mental Status: She is alert and oriented to person, place, and time.  Psychiatric:        Mood and Affect: Mood normal.        Behavior: Behavior normal.   No results found for this or any previous visit (from the past 24 hour(s)).  MAU Course  Procedures  MDM Pelvic US from 3 days ago reviewed, no retained POC. Normal exam and findings today for postpartum state. Pt reassured. Recommend elevating legs and/or compressions, reassured will improve 7-14 days postpartum. Stable for discharge home.    Assessment and Plan   1. Postpartum state   2. Dependent edema    Discharge home Follow up at [facility] as scheduled Return precautions  Allergies as of 03/11/2021   No Known Allergies      Medication List     TAKE these medications    acetaminophen 325 MG tablet Commonly known as: Tylenol Take 2 tablets (650 mg total) by mouth every 4 (four) hours as needed (for pain scale < 4).   ibuprofen 600 MG tablet Commonly known as: ADVIL Take 1 tablet (600 mg total) by mouth every 6 (six) hours.   iron polysaccharides 150 MG capsule Commonly known as: NIFEREX Take 1 capsule (150 mg total) by mouth daily.   multivitamin-prenatal 27-0.8 MG Tabs tablet Take 1 tablet by mouth daily at 12 noon.       Donette Larry, CNM 03/11/2021, 9:02 AM

## 2021-03-12 ENCOUNTER — Telehealth (HOSPITAL_COMMUNITY): Payer: Self-pay | Admitting: *Deleted

## 2021-03-12 NOTE — Telephone Encounter (Signed)
Pt. Had EPDS of 11 in the hospital. Rhea Pink, CNM notified via fax.  Duffy Rhody, RN 03-12-2021 at 3:03pm

## 2021-03-15 ENCOUNTER — Telehealth (HOSPITAL_COMMUNITY): Payer: Self-pay

## 2021-03-15 NOTE — Telephone Encounter (Signed)
No answer. No voicemail option. ? Heron Nay ?03/15/2021,1633 ?

## 2021-06-14 IMAGING — CR DG CHEST 2V
2 series · 2 of 2 positions shown · non-contrast
Comparison: 08/30/2016.

CLINICAL DATA: Right-sided chest pain.

EXAM:
CHEST - 2 VIEW

[chest pa]
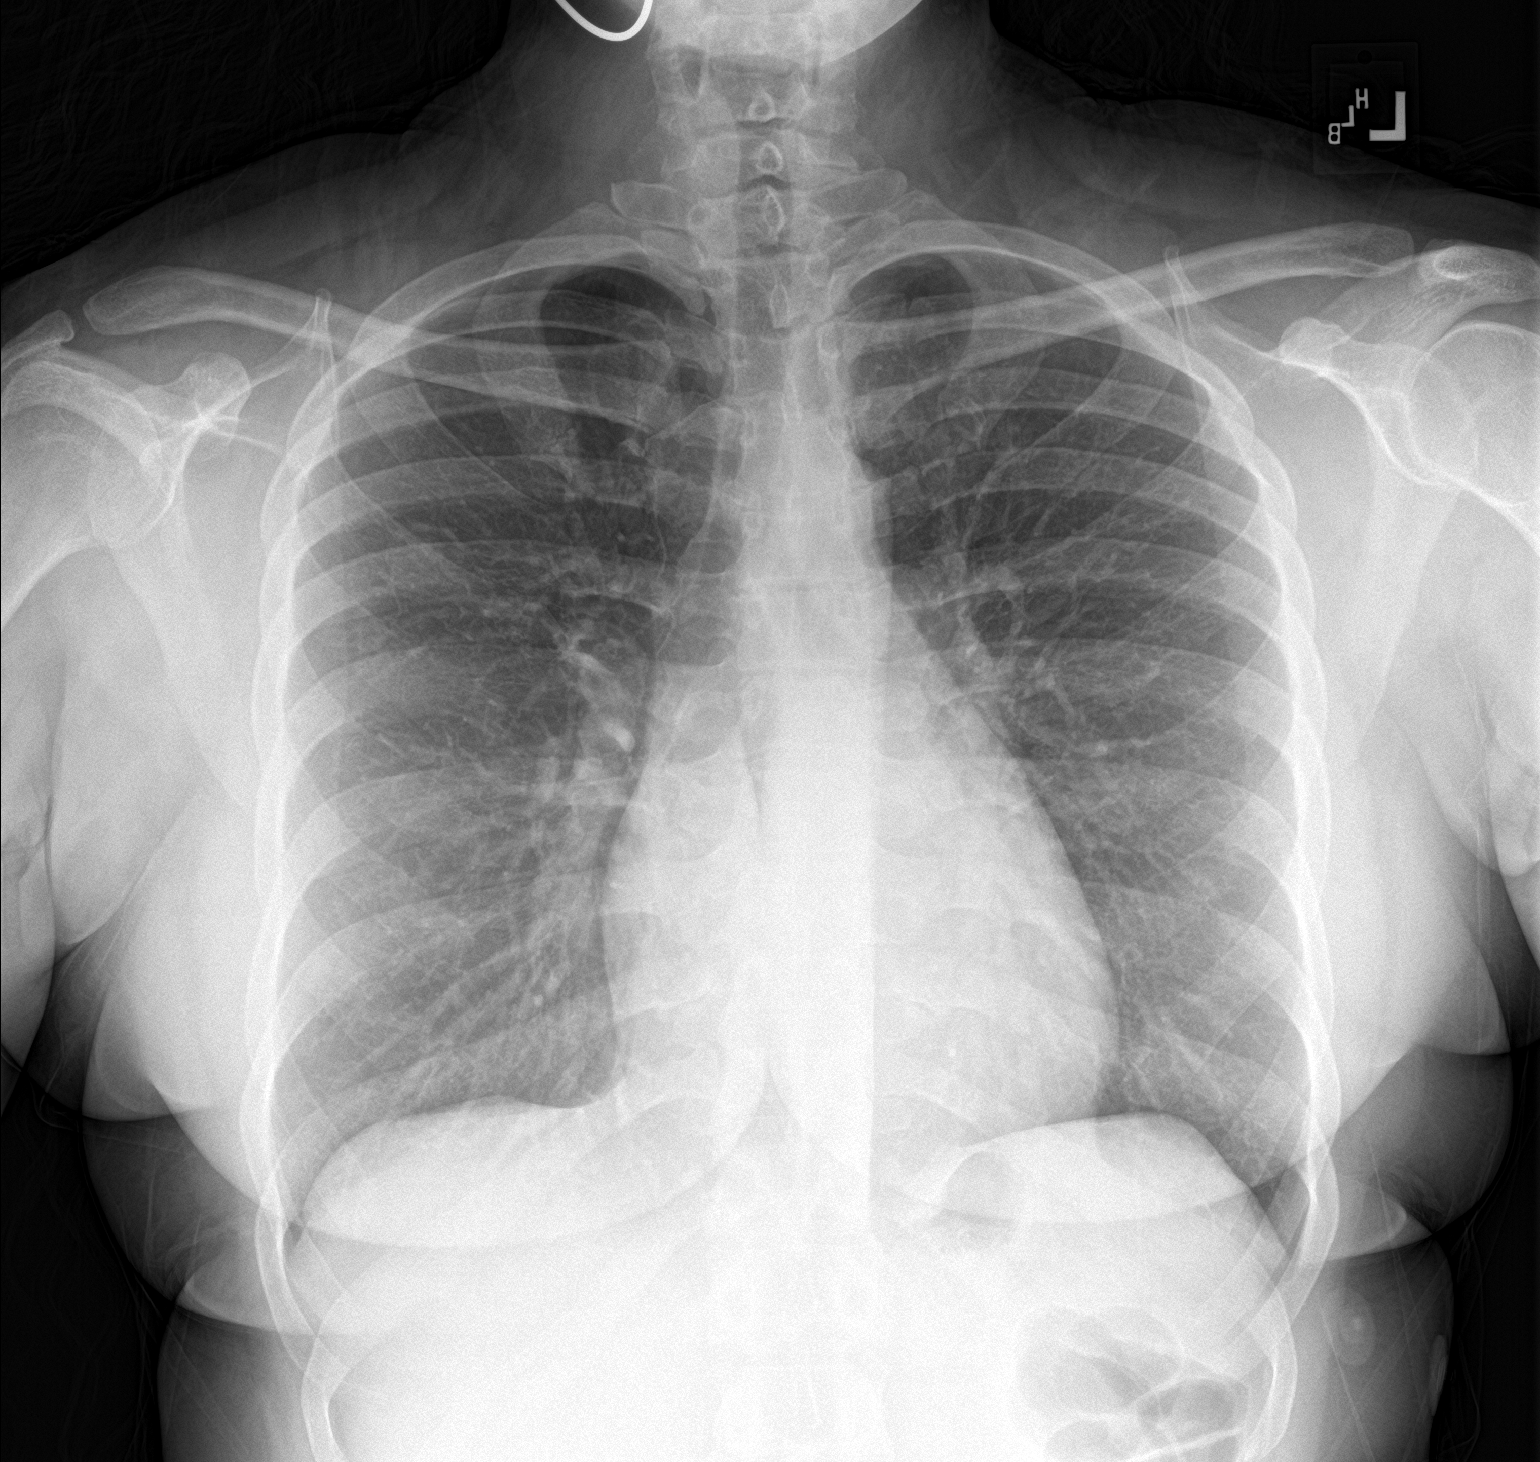

[chest lat]
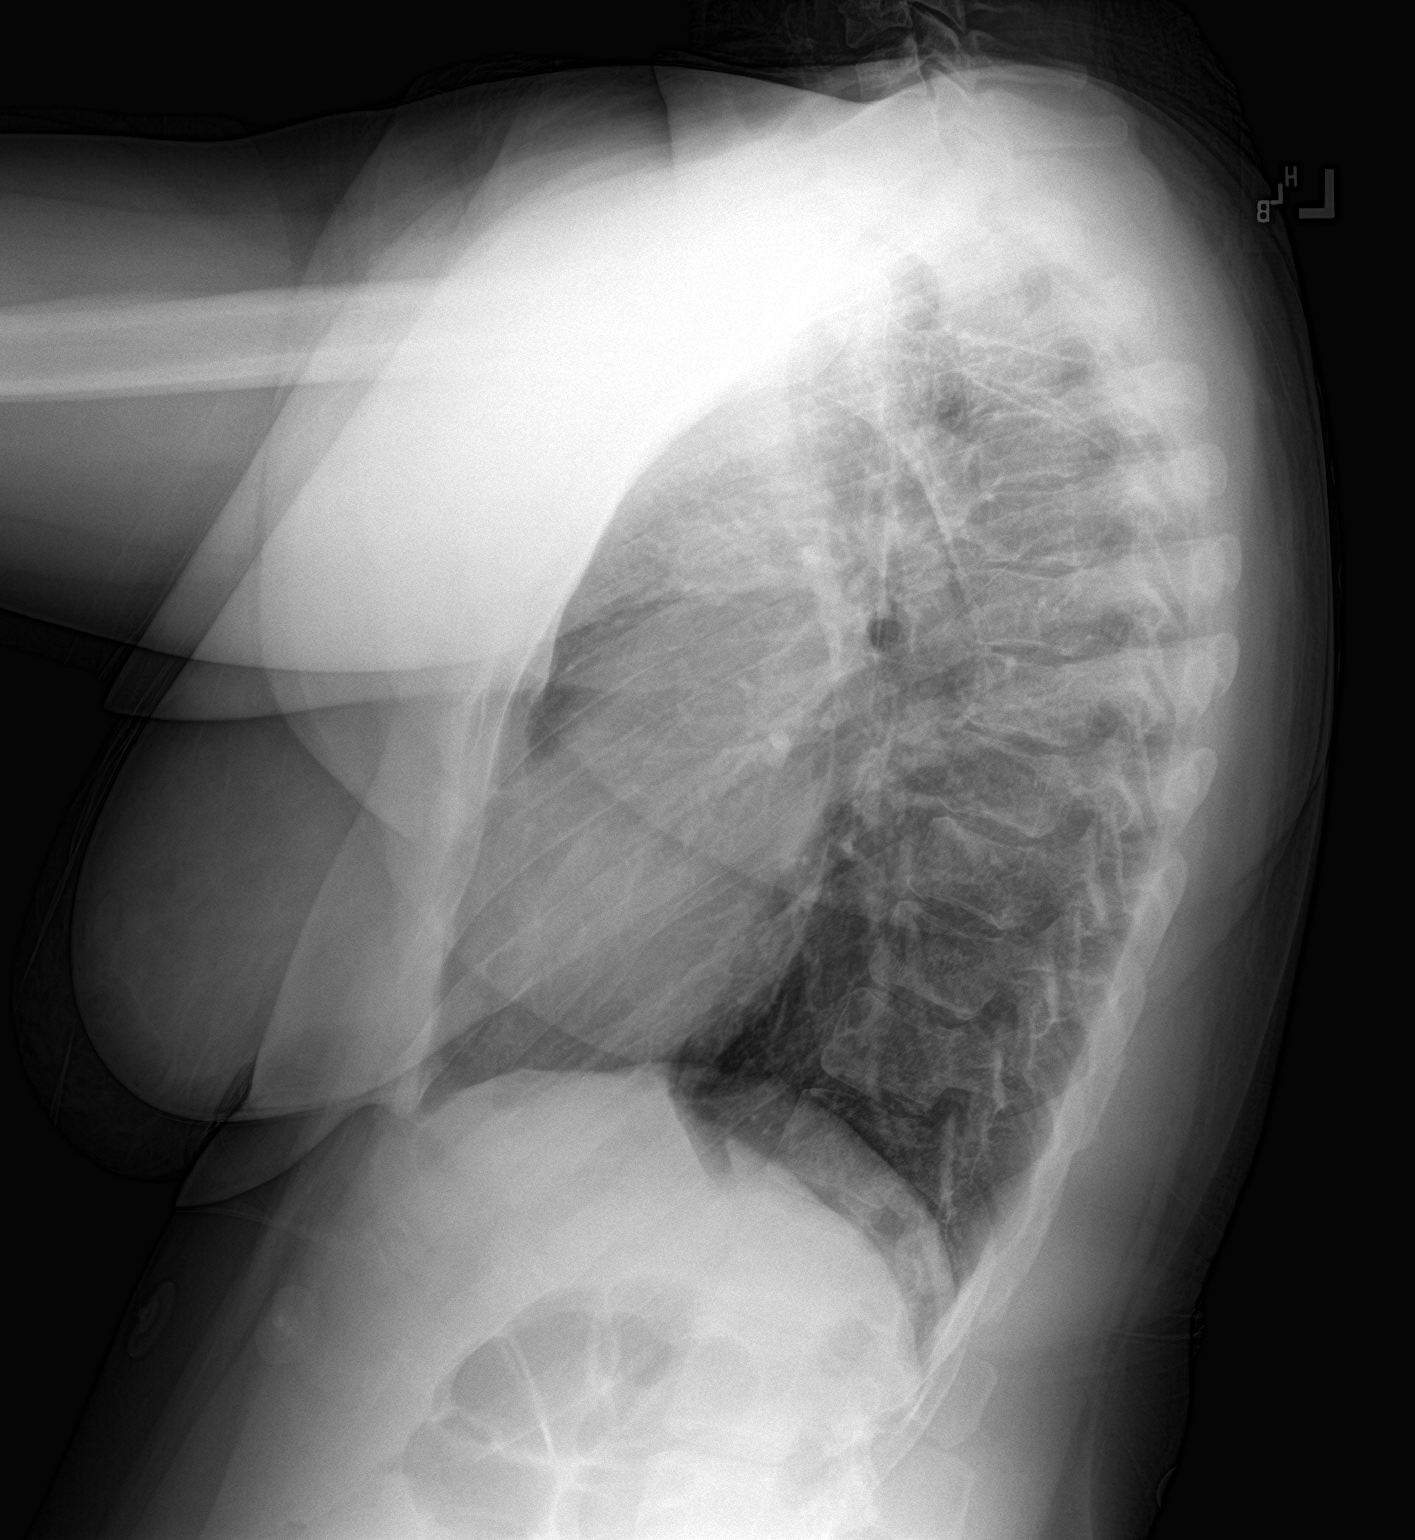

[2 of 2 positions shown; findings below may reference images not displayed]

FINDINGS: Mediastinum and hilar structures normal. Lungs are clear. No pleural
effusion or pneumothorax. Heart size normal. No acute bony
abnormality.
IMPRESSION: No acute cardiopulmonary disease.

## 2022-12-21 ENCOUNTER — Ambulatory Visit
Admission: EM | Admit: 2022-12-21 | Discharge: 2022-12-21 | Disposition: A | Discharge status: Home / Self Care | Payer: Medicaid Other

## 2022-12-21 DIAGNOSIS — H6991 Unspecified Eustachian tube disorder, right ear: Secondary | ICD-10-CM | POA: Diagnosis not present

## 2022-12-21 DIAGNOSIS — J0140 Acute pansinusitis, unspecified: Secondary | ICD-10-CM | POA: Diagnosis not present

## 2022-12-21 MED ORDER — AMOXICILLIN-POT CLAVULANATE 875-125 MG PO TABS: 1.0000 | ORAL_TABLET | Freq: Two times a day (BID) | ORAL | 0 refills | Status: AC

## 2022-12-21 NOTE — ED Provider Notes (Signed)
[facility]    CSN: [id number] Arrival date & time: 12/21/22  1408      History   Chief Complaint Chief Complaint  Patient presents with   Otalgia    HPI Helen Curtis is a 32 y.o. female.   Patient here today for evaluation of continued pain in right ear after being treated for ear infection.  She reports some facial swelling as well.  She has not any fever.  She does report significant sinus congestion.  The history is provided by the patient.  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"   History of chicken pox    History of chlamydia    No pertinent past medical history     Patient Active Problem List   Diagnosis Date Noted   SVD (spontaneous vaginal delivery) 03/05/2021   Encounter for induction of labor 03/03/2021   Gestational hypertension 03/03/2021   Post-dates pregnancy 06/14/2018   GBS (group B Streptococcus carrier), +RV culture, currently pregnant 06/03/2018   Back pain affecting pregnancy in third trimester 03/18/2018   Supervision of normal pregnancy 11/25/2017   Obesity 01/12/2011    Past Surgical History:  Procedure Laterality Date   THERAPEUTIC ABORTION      OB History     Gravida  5   Para  3   Term  3   Preterm  0   AB  2   Living  3      SAB  0   IAB  2   Ectopic  0   Multiple  0   Live Births  3            Home Medications    Prior to Admission medications   Medication Sig Start Date End Date Taking? Authorizing Provider  amoxicillin-clavulanate (AUGMENTIN) 875-125 MG tablet Take 1 tablet by mouth every 12 (twelve) hours. 12/21/22  Yes Tomi Bamberger, PA-C  BINAXNOW COVID-19 AG HOME TEST KIT TEST AS DIRECTED TODAY 10/08/22  Yes [provider]  ipratropium (ATROVENT) 0.06 % nasal spray Place 2  sprays into both nostrils 3 (three) times daily. 12/13/22  Yes [provider]  predniSONE (STERAPRED UNI-PAK 21 TAB) 5 MG (21) TBPK tablet Take 5 mg by mouth as directed. 12/13/22  Yes [provider]  acetaminophen (TYLENOL) 325 MG tablet Take 2 tablets (650 mg total) by mouth every 4 (four) hours as needed (for pain scale < 4). 03/06/21   Holshouser, Gerhard Munch, CNM  ibuprofen (ADVIL) 600 MG tablet Take 1 tablet (600 mg total) by mouth every 6 (six) hours. 03/06/21   Holshouser, Gerhard Munch, CNM  iron polysaccharides (NIFEREX) 150 MG capsule Take 1 capsule (150 mg total) by mouth daily. 03/06/21   Holshouser, Gerhard Munch, CNM  Prenatal Vit-Fe Fumarate-FA (MULTIVITAMIN-PRENATAL) 27-0.8 MG TABS tablet Take 1 tablet by mouth daily at 12 noon.    [provider]  Blood Pressure Monitor KIT Apply 1 each topically 1 day or 1 dose for 1 dose. Patient not taking: Reported on 06/10/2019 05/20/18 11/15/19  Constant, Peggy, MD    Family History Family History  Problem Relation Age of Onset   Hypertension Mother    Seizures Mother    Hypertension Maternal Grandmother    Diabetes Maternal Grandmother  Diabetes Maternal Grandfather    Cancer Paternal Grandmother    Alzheimer's disease Paternal Grandmother    Cancer Paternal Grandfather    Anesthesia problems Neg Hx     Social History Social History   Tobacco Use   Smoking status: Never   Smokeless tobacco: Never  Vaping Use   Vaping status: Never Used  Substance Use Topics   Alcohol use: Not Currently   Drug use: No     Allergies   Patient has no known allergies.   Review of Systems Review of Systems  Constitutional:  Negative for chills and fever.  HENT:  Positive for ear pain. Negative for congestion and sore throat.   Eyes:  Negative for discharge and redness.  Respiratory:  Negative for cough, shortness of breath and wheezing.   Gastrointestinal:  Negative for abdominal pain, diarrhea, nausea and vomiting.      Physical Exam Triage Vital Signs ED Triage Vitals  Encounter Vitals Group     BP 12/21/22 1447 116/69     Systolic BP Percentile --      Diastolic BP Percentile --      Pulse Rate 12/21/22 1447 70     Resp 12/21/22 1447 18     Temp 12/21/22 1447 98.5 F (36.9 C)     Temp Source 12/21/22 1447 Oral     SpO2 12/21/22 1447 97 %     Weight 12/21/22 1445 226 lb (102.5 kg)     Height 12/21/22 1445 5\' 7"  (1.702 m)     Head Circumference --      Peak Flow --      Pain Score 12/21/22 1442 8     Pain Loc --      Pain Education --      Exclude from Growth Chart --    No data found.  Updated Vital Signs BP 116/69 (BP Location: Left Arm)   Pulse 70   Temp 98.5 F (36.9 C) (Oral)   Resp 18   Ht 5\' 7"  (1.702 m)   Wt 226 lb (102.5 kg)   LMP 11/21/2022 (Approximate)   SpO2 97%   BMI 35.40 kg/m      Physical Exam Vitals and nursing note reviewed.  Constitutional:      General: She is not in acute distress.    Appearance: Normal appearance. She is not ill-appearing.  HENT:     Head: Normocephalic and atraumatic.     Left Ear: Tympanic membrane normal.     Ears:     Comments: Right TM erythematous    Nose: Congestion present.     Mouth/Throat:     Mouth: Mucous membranes are moist.     Pharynx: No oropharyngeal exudate or posterior oropharyngeal erythema.  Eyes:     Conjunctiva/sclera: Conjunctivae normal.  Cardiovascular:     Rate and Rhythm: Normal rate and regular rhythm.     Heart sounds: Normal heart sounds. No murmur heard. Pulmonary:     Effort: Pulmonary effort is normal. No respiratory distress.     Breath sounds: Normal breath sounds. No wheezing, rhonchi or rales.  Skin:    General: Skin is warm and dry.  Neurological:     Mental Status: She is alert.  Psychiatric:        Mood and Affect: Mood normal.        Thought Content: Thought content normal.      UC Treatments / Results  Labs (all labs ordered are listed, but only abnormal results are  displayed) Labs Reviewed - No data to display  EKG   Radiology No results found.  Procedures Procedures (including critical care time)  Medications Ordered in UC Medications - No data to display  Initial Impression / Assessment and Plan / UC Course  I have reviewed the triage vital signs and the nursing notes.  Pertinent labs & imaging results that were available during my care of the patient were reviewed by me and considered in my medical decision making (see chart for details).    Will treat to cover sinusitis as well as eustachian tube dysfunction.  Augmentin prescribed and recommended she continue Flonase that she reports she has at home.  Encouraged follow-up if no gradual improvement with any further concerns.  Final Clinical Impressions(s) / UC Diagnoses   Final diagnoses:  Acute pansinusitis, recurrence not specified  Dysfunction of right eustachian tube   Discharge Instructions   None    ED Prescriptions     Medication Sig Dispense Auth. Provider   amoxicillin-clavulanate (AUGMENTIN) 875-125 MG tablet Take 1 tablet by mouth every 12 (twelve) hours. 14 tablet Tomi Bamberger, PA-C      PDMP not reviewed this encounter.   Tomi Bamberger, PA-C 12/25/22 [phone]

## 2022-12-21 NOTE — ED Triage Notes (Signed)
"  I had an ear infection last week (seen at another Urgent Care and treated for OM in Right ear with oral antibiotics and steroids). Still taking oral antibiotics but still having pain in right ear with facial swelling as well". No fever.

## 2023-02-11 ENCOUNTER — Ambulatory Visit
Admission: RE | Admit: 2023-02-11 | Discharge: 2023-02-11 | Disposition: A | Discharge status: Home / Self Care | Payer: Medicaid Other | Source: Ambulatory Visit | Attending: Internal Medicine | Admitting: Internal Medicine

## 2023-02-11 VITALS — BP 106/71 | HR 80 | Resp 17

## 2023-02-11 DIAGNOSIS — Z20828 Contact with and (suspected) exposure to other viral communicable diseases: Secondary | ICD-10-CM

## 2023-02-11 DIAGNOSIS — J111 Influenza due to unidentified influenza virus with other respiratory manifestations: Secondary | ICD-10-CM

## 2023-02-11 MED ORDER — OSELTAMIVIR PHOSPHATE 75 MG PO CAPS: 75.0000 mg | ORAL_CAPSULE | Freq: Two times a day (BID) | ORAL | 0 refills | Status: AC

## 2023-02-11 NOTE — Discharge Instructions (Signed)
You likely have the flu. Take Tamiflu every 12 hours for the next 5 days to improve symptoms and stop the virus from replicating in your body. Ibuprofen/tylenol as needed for fevers and body aches. Mucinex as needed for nasal congestion.  If you develop any new or worsening symptoms or if your symptoms do not start to improve, please return here or follow-up with your primary care provider. If your symptoms are severe, please go to the emergency room.

## 2023-02-11 NOTE — ED Triage Notes (Signed)
Pt c/o chills, sweats, and cough that began Sunday.

## 2023-02-12 NOTE — ED Provider Notes (Signed)
[facility]    CSN: [id number] Arrival date & time: 02/11/23  1053      History   Chief Complaint Chief Complaint  Patient presents with   Chills    Son was positive for Covid and flu - Entered by patient    HPI Helen Curtis is a 33 y.o. female.   Helen Curtis is a 33 y.o. female presenting for chief complaint of fever, chills, slight cough, and congestion that started 2 days ago. Her son tested positive for flu and she believes she may have the same. Her symptoms are currently mild and she is unsure of max temp at home. Denies N/V/D, abdominal pain, rash, dizziness, CP, SOB, and palpitations. Denies history of chronic respiratory problems.      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"   History of chicken pox    History of chlamydia    No pertinent past medical history     Patient Active Problem List   Diagnosis Date Noted   SVD (spontaneous vaginal delivery) 03/05/2021   Encounter for induction of labor 03/03/2021   Gestational hypertension 03/03/2021   Post-dates pregnancy 06/14/2018   GBS (group B Streptococcus carrier), +RV culture, currently pregnant 06/03/2018   Back pain affecting pregnancy in third trimester 03/18/2018   Supervision of normal pregnancy 11/25/2017   Obesity 01/12/2011    Past Surgical History:  Procedure Laterality Date   THERAPEUTIC ABORTION      OB History     Gravida  5   Para  3   Term  3   Preterm  0   AB  2   Living  3      SAB  0   IAB  2   Ectopic  0   Multiple  0   Live Births  3            Home Medications    Prior to Admission medications   Medication Sig Start Date End Date Taking? Authorizing Provider  oseltamivir (TAMIFLU) 75 MG capsule Take 1 capsule (75 mg total) by mouth every 12 (twelve) hours. 02/11/23  Yes Carlisle Beers, FNP  acetaminophen (TYLENOL) 325 MG tablet Take 2 tablets (650 mg total) by  mouth every 4 (four) hours as needed (for pain scale < 4). 03/06/21   Holshouser, Gerhard Munch, CNM  amoxicillin-clavulanate (AUGMENTIN) 875-125 MG tablet Take 1 tablet by mouth every 12 (twelve) hours. Patient not taking: Reported on 02/11/2023 12/21/22   Tomi Bamberger, PA-C  BINAXNOW COVID-19 AG HOME TEST KIT TEST AS DIRECTED TODAY 10/08/22   [provider]  ibuprofen (ADVIL) 600 MG tablet Take 1 tablet (600 mg total) by mouth every 6 (six) hours. 03/06/21   Holshouser, Cala Bradford B, CNM  ipratropium (ATROVENT) 0.06 % nasal spray Place 2 sprays into both nostrils 3 (three) times daily. Patient not taking: Reported on 02/11/2023 12/13/22   [provider]  iron polysaccharides (NIFEREX) 150 MG capsule Take 1 capsule (150 mg total) by mouth daily. Patient not taking: Reported on 02/11/2023 03/06/21   Holshouser, Gerhard Munch, CNM  predniSONE (STERAPRED UNI-PAK 21 TAB) 5 MG (21) TBPK tablet Take 5 mg by mouth as directed. Patient not taking: Reported on 02/11/2023 12/13/22   [provider]  Prenatal Vit-Fe Fumarate-FA (MULTIVITAMIN-PRENATAL) 27-0.8 MG TABS tablet Take 1 tablet by mouth daily at 12 noon.    [provider]  Blood Pressure Monitor KIT Apply 1 each topically 1 day or 1 dose for 1 dose. Patient not taking: Reported on 06/10/2019 05/20/18 11/15/19  Constant, Peggy, MD    Family History Family History  Problem Relation Age of Onset   Hypertension Mother    Seizures Mother    Hypertension Maternal Grandmother    Diabetes Maternal Grandmother    Diabetes Maternal Grandfather    Cancer Paternal Grandmother    Alzheimer's disease Paternal Grandmother    Cancer Paternal Grandfather    Anesthesia problems Neg Hx     Social History Social History   Tobacco Use   Smoking status: Never   Smokeless tobacco: Never  Vaping Use   Vaping status: Never Used  Substance Use Topics   Alcohol use: Not Currently   Drug use: No     Allergies   Patient has no  known allergies.   Review of Systems Review of Systems Per HPI  Physical Exam Triage Vital Signs ED Triage Vitals  Encounter Vitals Group     BP 02/11/23 1203 106/71     Systolic BP Percentile --      Diastolic BP Percentile --      Pulse Rate 02/11/23 1203 80     Resp 02/11/23 1203 17     Temp --      Temp Source 02/11/23 1203 Oral     SpO2 02/11/23 1203 95 %     Weight --      Height --      Head Circumference --      Peak Flow --      Pain Score 02/11/23 1201 6     Pain Loc --      Pain Education --      Exclude from Growth Chart --    No data found.  Updated Vital Signs BP 106/71 (BP Location: Left Arm)   Pulse 80   Resp 17   LMP 01/01/2023   SpO2 95%   Visual Acuity Right Eye Distance:   Left Eye Distance:   Bilateral Distance:    Right Eye Near:   Left Eye Near:    Bilateral Near:     Physical Exam Vitals and nursing note reviewed.  Constitutional:      Appearance: She is not ill-appearing or toxic-appearing.  HENT:     Head: Normocephalic and atraumatic.     Right Ear: Hearing, tympanic membrane, ear canal and external ear normal.     Left Ear: Hearing, tympanic membrane, ear canal and external ear normal.     Nose: Nose normal.     Mouth/Throat:     Lips: Pink.     Mouth: Mucous membranes are moist. No injury or oral lesions.     Dentition: Normal dentition.     Tongue: No lesions.     Pharynx: Oropharynx is clear. Uvula midline. No pharyngeal swelling, oropharyngeal exudate, posterior oropharyngeal erythema, uvula swelling or postnasal drip.     Tonsils: No tonsillar exudate.  Eyes:     General: Lids are normal. Vision grossly intact. Gaze aligned appropriately.     Extraocular Movements: Extraocular movements intact.     Conjunctiva/sclera: Conjunctivae normal.  Neck:     Trachea: Trachea and phonation normal.  Cardiovascular:     Rate and Rhythm: Normal rate and regular rhythm.     Heart sounds: Normal heart sounds, S1 normal and S2  normal.  Pulmonary:     Effort: Pulmonary effort is normal. No respiratory distress.  Breath sounds: Normal breath sounds and air entry. No wheezing, rhonchi or rales.  Chest:     Chest wall: No tenderness.  Musculoskeletal:     Cervical back: Neck supple.  Lymphadenopathy:     Cervical: No cervical adenopathy.  Skin:    General: Skin is warm and dry.     Capillary Refill: Capillary refill takes less than 2 seconds.     Findings: No rash.  Neurological:     General: No focal deficit present.     Mental Status: She is alert and oriented to person, place, and time. Mental status is at baseline.     Cranial Nerves: No dysarthria or facial asymmetry.  Psychiatric:        Mood and Affect: Mood normal.        Speech: Speech normal.        Behavior: Behavior normal.        Thought Content: Thought content normal.        Judgment: Judgment normal.      UC Treatments / Results  Labs (all labs ordered are listed, but only abnormal results are displayed) Labs Reviewed - No data to display  EKG   Radiology No results found.  Procedures Procedures (including critical care time)  Medications Ordered in UC Medications - No data to display  Initial Impression / Assessment and Plan / UC Course  I have reviewed the triage vital signs and the nursing notes.  Pertinent labs & imaging results that were available during my care of the patient were reviewed by me and considered in my medical decision making (see chart for details).   1. Influenza-like illness, exposure to the flu Recent known exposure to flu via direct contact with her son. Her other children are sick as well. Deferred flu testing given high clinical suspicion for influenza with direct exposure.  Tamiflu ordered.  Recommend supportive care for symptomatic relief as outlined in AVS.  Lungs clear, vitals stable, therefore deferred imaging of the chest.   Counseled patient on potential for adverse effects with  medications prescribed/recommended today, strict ER and return-to-clinic precautions discussed, patient verbalized understanding.    Final Clinical Impressions(s) / UC Diagnoses   Final diagnoses:  Influenza-like illness  Exposure to the flu     Discharge Instructions      You likely have the flu. Take Tamiflu every 12 hours for the next 5 days to improve symptoms and stop the virus from replicating in your body. Ibuprofen/tylenol as needed for fevers and body aches. Mucinex as needed for nasal congestion.  If you develop any new or worsening symptoms or if your symptoms do not start to improve, please return here or follow-up with your primary care provider. If your symptoms are severe, please go to the emergency room.    ED Prescriptions     Medication Sig Dispense Auth. Provider   oseltamivir (TAMIFLU) 75 MG capsule Take 1 capsule (75 mg total) by mouth every 12 (twelve) hours. 10 capsule Carlisle Beers, FNP      PDMP not reviewed this encounter.   Carlisle Beers, [place] 02/12/23 1310

## 2024-02-06 ENCOUNTER — Other Ambulatory Visit: Payer: Self-pay

## 2024-02-06 ENCOUNTER — Ambulatory Visit: Admission: EM | Admit: 2024-02-06 | Discharge: 2024-02-06 | Disposition: A | Discharge status: Home / Self Care

## 2024-02-06 DIAGNOSIS — R0989 Other specified symptoms and signs involving the circulatory and respiratory systems: Secondary | ICD-10-CM

## 2024-02-06 NOTE — ED Provider Notes (Signed)
 [facility]    CSN: [id number] Arrival date & time: 02/06/24  0859      History   Chief Complaint Chief Complaint  Patient presents with   Fatigue   Generalized Body Aches    HPI Helen Curtis is a 34 y.o. female.   HPI  Pt is here today with concerns for fatigue, itchy throat, generalized body aches, chills. She reports her two children have been sick for the last week with similar symptoms. She states her symptoms have been ongoing for about 2 days. She has been taking Dayquil since yesterday and plans to try mucinex today. 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   History of chicken pox    History of chlamydia    No pertinent past medical history     Patient Active Problem List   Diagnosis Date Noted   SVD (spontaneous vaginal delivery) 03/05/2021   Encounter for induction of labor 03/03/2021   Gestational hypertension 03/03/2021   Post-dates pregnancy 06/14/2018   GBS (group B Streptococcus carrier), +RV culture, currently pregnant 06/03/2018   Back pain affecting pregnancy in third trimester 03/18/2018   Supervision of normal pregnancy 11/25/2017   Obesity 01/12/2011    Past Surgical History:  Procedure Laterality Date   THERAPEUTIC ABORTION      OB History     Gravida  5   Para  3   Term  3   Preterm  0   AB  2   Living  3      SAB  0   IAB  2   Ectopic  0   Multiple  0   Live Births  3            Home Medications    Prior to Admission medications  Medication Sig Start Date End Date Taking? Authorizing Provider  acetaminophen  (TYLENOL ) 325 MG tablet Take 2 tablets (650 mg total) by mouth every 4 (four) hours as needed (for pain scale < 4). 03/06/21   Holshouser, Suzen NOVAK, CNM  amoxicillin -clavulanate (AUGMENTIN ) 875-125 MG tablet Take 1 tablet by mouth every 12 (twelve) hours. Patient  not taking: Reported on 02/11/2023 12/21/22   Billy Asberry FALCON, PA-C  BINAXNOW COVID-19 AG HOME TEST KIT TEST AS DIRECTED TODAY 10/08/22   [provider]  ibuprofen  (ADVIL ) 600 MG tablet Take 1 tablet (600 mg total) by mouth every 6 (six) hours. 03/06/21   Holshouser, Suzen B, CNM  ipratropium (ATROVENT) 0.06 % nasal spray Place 2 sprays into both nostrils 3 (three) times daily. Patient not taking: Reported on 02/11/2023 12/13/22   [provider]  iron  polysaccharides (NIFEREX) 150 MG capsule Take 1 capsule (150 mg total) by mouth daily. Patient not taking: Reported on 02/11/2023 03/06/21   Holshouser, Suzen NOVAK, CNM  oseltamivir  (TAMIFLU ) 75 MG capsule Take 1 capsule (75 mg total) by mouth every 12 (twelve) hours. 02/11/23   Enedelia Dorna HERO, FNP  predniSONE  (STERAPRED UNI-PAK 21 TAB) 5 MG (21) TBPK tablet Take 5 mg by mouth as directed. Patient not taking: Reported on 02/11/2023 12/13/22   [provider]  Prenatal Vit-Fe Fumarate-FA (MULTIVITAMIN-PRENATAL) 27-0.8 MG TABS tablet Take 1 tablet by mouth daily at 12 noon.    [provider]  Blood Pressure Monitor KIT Apply 1 each topically 1 day or 1 dose  for 1 dose. Patient not taking: Reported on 06/10/2019 05/20/18 11/15/19  Constant, Peggy, MD    Family History Family History  Problem Relation Age of Onset   Hypertension Mother    Seizures Mother    Hypertension Maternal Grandmother    Diabetes Maternal Grandmother    Diabetes Maternal Grandfather    Cancer Paternal Grandmother    Alzheimer's disease Paternal Grandmother    Cancer Paternal Grandfather    Anesthesia problems Neg Hx     Social History Social History[1]   Allergies   Patient has no known allergies.   Review of Systems Review of Systems  Constitutional:  Positive for chills and fatigue. Negative for fever.  HENT:  Positive for congestion, rhinorrhea and sore throat.   Respiratory:  Negative for cough and shortness of breath.    Musculoskeletal:  Positive for myalgias.     Physical Exam Triage Vital Signs ED Triage Vitals  Encounter Vitals Group     BP 02/06/24 0935 118/75     Girls Systolic BP Percentile --      Girls Diastolic BP Percentile --      Boys Systolic BP Percentile --      Boys Diastolic BP Percentile --      Pulse Rate 02/06/24 0935 81     Resp 02/06/24 0935 15     Temp 02/06/24 0935 97.6 F (36.4 C)     Temp Source 02/06/24 0935 Temporal     SpO2 02/06/24 0935 97 %     Weight 02/06/24 0935 234 lb 3.2 oz (106.2 kg)     Height --      Head Circumference --      Peak Flow --      Pain Score 02/06/24 1008 8     Pain Loc --      Pain Education --      Exclude from Growth Chart --    No data found.  Updated Vital Signs BP 118/75 (BP Location: Right Arm)   Pulse 81   Temp 97.6 F (36.4 C) (Temporal)   Resp 15   Wt 234 lb 3.2 oz (106.2 kg)   SpO2 97%   Breastfeeding No   BMI 36.68 kg/m   Visual Acuity Right Eye Distance:   Left Eye Distance:   Bilateral Distance:    Right Eye Near:   Left Eye Near:    Bilateral Near:     Physical Exam Vitals reviewed.  Constitutional:      General: She is awake.     Appearance: Normal appearance. She is well-developed and well-groomed.  HENT:     Head: Normocephalic and atraumatic.     Right Ear: Hearing, tympanic membrane and ear canal normal.     Left Ear: Hearing, tympanic membrane and ear canal normal.     Mouth/Throat:     Lips: Pink.     Mouth: Mucous membranes are moist.     Pharynx: Oropharynx is clear. Uvula midline. No pharyngeal swelling, oropharyngeal exudate, posterior oropharyngeal erythema, uvula swelling or postnasal drip.  Cardiovascular:     Rate and Rhythm: Normal rate and regular rhythm.     Pulses: Normal pulses.          Radial pulses are 2+ on the right side and 2+ on the left side.     Heart sounds: Normal heart sounds. No murmur heard.    No friction rub. No gallop.  Pulmonary:     Effort: Pulmonary  effort is normal.  Breath sounds: Normal breath sounds. No decreased air movement. No decreased breath sounds, wheezing, rhonchi or rales.  Musculoskeletal:     Cervical back: Normal range of motion and neck supple.  Lymphadenopathy:     Head:     Right side of head: No submental, submandibular or preauricular adenopathy.     Left side of head: No submental, submandibular or preauricular adenopathy.     Cervical:     Right cervical: No superficial cervical adenopathy.    Left cervical: No superficial cervical adenopathy.     Upper Body:     Right upper body: No supraclavicular adenopathy.     Left upper body: No supraclavicular adenopathy.  Neurological:     Mental Status: She is alert.  Psychiatric:        Behavior: Behavior is cooperative.      UC Treatments / Results  Labs (all labs ordered are listed, but only abnormal results are displayed) Labs Reviewed - No data to display  EKG   Radiology No results found.  Procedures Procedures (including critical care time)  Medications Ordered in UC Medications - No data to display  Initial Impression / Assessment and Plan / UC Course  I have reviewed the triage vital signs and the nursing notes.  Pertinent labs & imaging results that were available during my care of the patient were reviewed by me and considered in my medical decision making (see chart for details).      Final Clinical Impressions(s) / UC Diagnoses   Final diagnoses:  Symptoms of upper respiratory infection (URI)  Patient is here today with concerns of fatigue, itchy throat, generalized body ache and chills that has started over the last 2 to 3 days.  She reports that all of her children are sick with similar symptoms.  Children have tested negative for the flu here in clinic.  Patient's vitals and physical exam are largely reassuring today.  At this time suspect likely viral etiology and reviewed supportive measures for upper respiratory illness.  ED  and return precautions reviewed and provided in AVS.  Follow-up as needed.    Discharge Instructions      Based on your described symptoms and the duration of symptoms it is likely that you have a viral upper respiratory infection (often called a cold)  Symptoms can last for 3-10 days with lingering cough and intermittent symptoms potentially  lasting several  weeks after that.  The goal of treatment at this time is to reduce your symptoms and discomfort   You can use the following medications and measures to help yourself feel better until your body fights this off: DayQuil/NyQuil, TheraFlu, Alka-Seltzer  (these medications typically have the same active ingredients in them so you can choose whichever one you prefer and take consistently during the day and night according to the manufactures instructions.) Flonase A daily antihistamine such as Zyrtec , Claritin , Allegra per your preference.  Please choose 1 and take consistently. Increased fluids.  It is recommended that you take in at least 64 ounces of water per day when you are not sick so it is important to increase this when you are sick and your body may be running fever. Rest Cough drops Chloraseptic throat spray to help with sore throat Nasal saline spray or nasal flushes to help with congestion and runny nose  If your symptoms seem like they are getting worse over the next 5 to 7 days or not improving you can always follow-up here in urgent care  or go to your primary care provider for further management. Go to the ER if you begin to have more serious symptoms such as shortness of breath, trouble breathing, loss of consciousness, swelling around the eyes, high fever, severe lasting headaches, vision changes or neck pain/stiffness.       ED Prescriptions   None    PDMP not reviewed this encounter.     [1]  Social History Tobacco Use   Smoking status: Never   Smokeless tobacco: Never  Vaping Use   Vaping status:  Never Used  Substance Use Topics   Alcohol use: Not Currently   Drug use: No     Joselynn Amoroso, Rocky BRAVO, PA-C 02/06/24 1158  "

## 2024-02-06 NOTE — ED Triage Notes (Addendum)
 Pt presents with c/o dry itchy throat, fatigue, generalized body aches, and feeling very cold. Symptom onset was yesterday. Children are sick with similar symptoms. Denies fevers at home. OTC Dayquil taken for sxs. Did not seem to make a difference.

## 2024-02-06 NOTE — Discharge Instructions (Signed)
